# Patient Record
Sex: Female | Born: 1985 | Race: White | Hispanic: No | Marital: Married | Smoking: Heavy tobacco smoker
Health system: Southern US, Community
[De-identification: ages and names within clinical notes are randomized; demographics above are authoritative.]

## PROBLEM LIST (undated history)

## (undated) ENCOUNTER — Inpatient Hospital Stay: Payer: Self-pay

## (undated) ENCOUNTER — Inpatient Hospital Stay (HOSPITAL_COMMUNITY): Payer: Self-pay

## (undated) ENCOUNTER — Emergency Department (HOSPITAL_BASED_OUTPATIENT_CLINIC_OR_DEPARTMENT_OTHER): Admission: EM | Payer: Medicaid Other | Source: Home / Self Care

## (undated) DIAGNOSIS — B999 Unspecified infectious disease: Secondary | ICD-10-CM

## (undated) DIAGNOSIS — J45909 Unspecified asthma, uncomplicated: Secondary | ICD-10-CM

## (undated) DIAGNOSIS — F419 Anxiety disorder, unspecified: Secondary | ICD-10-CM

## (undated) DIAGNOSIS — F329 Major depressive disorder, single episode, unspecified: Secondary | ICD-10-CM

## (undated) DIAGNOSIS — F1910 Other psychoactive substance abuse, uncomplicated: Secondary | ICD-10-CM

## (undated) DIAGNOSIS — N809 Endometriosis, unspecified: Secondary | ICD-10-CM

## (undated) DIAGNOSIS — F32A Depression, unspecified: Secondary | ICD-10-CM

## (undated) HISTORY — PX: DIAGNOSTIC LAPAROSCOPY: SUR761

---
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
## 2007-01-01 ENCOUNTER — Emergency Department: Payer: Self-pay | Admitting: Emergency Medicine

## 2007-01-29 ENCOUNTER — Observation Stay: Payer: Self-pay | Admitting: Obstetrics and Gynecology

## 2007-03-12 ENCOUNTER — Observation Stay: Payer: Self-pay | Admitting: Obstetrics and Gynecology

## 2007-03-30 ENCOUNTER — Ambulatory Visit: Payer: Self-pay | Admitting: Urology

## 2007-04-07 ENCOUNTER — Observation Stay: Payer: Self-pay | Admitting: Obstetrics and Gynecology

## 2007-04-18 ENCOUNTER — Observation Stay: Payer: Self-pay | Admitting: Obstetrics and Gynecology

## 2007-04-25 ENCOUNTER — Observation Stay: Payer: Self-pay | Admitting: Obstetrics and Gynecology

## 2007-04-26 ENCOUNTER — Inpatient Hospital Stay: Payer: Self-pay | Admitting: Obstetrics and Gynecology

## 2007-04-26 ENCOUNTER — Observation Stay: Payer: Self-pay | Admitting: Obstetrics and Gynecology

## 2008-07-06 ENCOUNTER — Emergency Department: Payer: Self-pay | Admitting: Emergency Medicine

## 2008-07-08 ENCOUNTER — Emergency Department: Payer: Self-pay | Admitting: Emergency Medicine

## 2008-07-20 ENCOUNTER — Emergency Department: Payer: Self-pay | Admitting: Emergency Medicine

## 2009-02-14 ENCOUNTER — Emergency Department: Payer: Self-pay | Admitting: Emergency Medicine

## 2009-08-22 ENCOUNTER — Emergency Department: Payer: Self-pay | Admitting: Emergency Medicine

## 2010-01-03 ENCOUNTER — Emergency Department: Payer: Self-pay | Admitting: Emergency Medicine

## 2011-02-14 ENCOUNTER — Emergency Department: Payer: Self-pay | Admitting: Emergency Medicine

## 2011-05-10 ENCOUNTER — Emergency Department: Payer: Self-pay | Admitting: Emergency Medicine

## 2011-12-03 ENCOUNTER — Observation Stay: Payer: Self-pay

## 2012-02-07 ENCOUNTER — Observation Stay: Payer: Self-pay

## 2012-03-02 ENCOUNTER — Observation Stay: Payer: Self-pay | Admitting: Obstetrics and Gynecology

## 2012-03-10 ENCOUNTER — Observation Stay: Payer: Self-pay

## 2012-03-17 ENCOUNTER — Inpatient Hospital Stay: Payer: Self-pay | Admitting: Obstetrics and Gynecology

## 2012-03-17 LAB — CBC WITH DIFFERENTIAL/PLATELET
Eosinophil #: 0.1 10*3/uL (ref 0.0–0.7)
Eosinophil %: 0.7 %
HCT: 35.9 % (ref 35.0–47.0)
HGB: 11.7 g/dL — ABNORMAL LOW (ref 12.0–16.0)
Lymphocyte #: 1.7 10*3/uL (ref 1.0–3.6)
Lymphocyte %: 12 %
MCH: 30.9 pg (ref 26.0–34.0)
MCHC: 32.5 g/dL (ref 32.0–36.0)
MCV: 95 fL (ref 80–100)
Monocyte #: 0.6 x10 3/mm (ref 0.2–0.9)
Monocyte %: 4.4 %
Neutrophil #: 11.6 10*3/uL — ABNORMAL HIGH (ref 1.4–6.5)
Neutrophil %: 82.6 %
Platelet: 183 10*3/uL (ref 150–440)
RBC: 3.78 10*6/uL — ABNORMAL LOW (ref 3.80–5.20)
RDW: 13.7 % (ref 11.5–14.5)
WBC: 14 10*3/uL — ABNORMAL HIGH (ref 3.6–11.0)

## 2013-06-24 ENCOUNTER — Emergency Department: Payer: Self-pay | Admitting: Emergency Medicine

## 2013-09-22 ENCOUNTER — Emergency Department: Payer: Self-pay | Admitting: Emergency Medicine

## 2014-03-29 ENCOUNTER — Emergency Department: Payer: Self-pay | Admitting: Emergency Medicine

## 2014-06-24 ENCOUNTER — Emergency Department: Payer: Self-pay | Admitting: Emergency Medicine

## 2014-07-23 ENCOUNTER — Emergency Department: Payer: Self-pay | Admitting: Emergency Medicine

## 2014-08-01 ENCOUNTER — Emergency Department: Payer: Self-pay | Admitting: Emergency Medicine

## 2014-12-06 NOTE — L&D Delivery Note (Signed)
Delivery Note At 1:22 AM a vigorous female was delivered via Vaginal, Spontaneous Delivery (Presentation Direct Occiput Posterior).  APGAR: 8, 9; weight  .   Placenta status: Intact, Spontaneous.  Cord: 3 vessels.   Complications: persistent OP, variable decelerations second stage, Moderate MSAF  Anesthesia: Epidural  Episiotomy: None Lacerations: None Suture Repair: N/A Est. Blood Loss (mL): 350  Mom in LDR for recovery.  Baby to Couplet care / Skin to Skin. Logan/ Bottle  Sargun Rummell 09/16/2015, 1:55 AM

## 2015-01-28 ENCOUNTER — Emergency Department: Payer: Self-pay | Admitting: Student

## 2015-03-13 ENCOUNTER — Inpatient Hospital Stay (HOSPITAL_COMMUNITY)
Admission: AD | Admit: 2015-03-13 | Discharge: 2015-03-13 | Disposition: A | Discharge status: Home / Self Care | Payer: Medicaid Other | Source: Ambulatory Visit | Attending: Obstetrics & Gynecology | Admitting: Obstetrics & Gynecology

## 2015-03-13 ENCOUNTER — Encounter (HOSPITAL_COMMUNITY): Payer: Self-pay | Admitting: *Deleted

## 2015-03-13 ENCOUNTER — Inpatient Hospital Stay (HOSPITAL_COMMUNITY): Payer: Medicaid Other

## 2015-03-13 DIAGNOSIS — Z3A13 13 weeks gestation of pregnancy: Secondary | ICD-10-CM

## 2015-03-13 DIAGNOSIS — R1030 Lower abdominal pain, unspecified: Secondary | ICD-10-CM | POA: Insufficient documentation

## 2015-03-13 DIAGNOSIS — Z3A12 12 weeks gestation of pregnancy: Secondary | ICD-10-CM | POA: Diagnosis not present

## 2015-03-13 DIAGNOSIS — Z3687 Encounter for antenatal screening for uncertain dates: Secondary | ICD-10-CM

## 2015-03-13 DIAGNOSIS — Z87891 Personal history of nicotine dependence: Secondary | ICD-10-CM | POA: Diagnosis not present

## 2015-03-13 DIAGNOSIS — O26899 Other specified pregnancy related conditions, unspecified trimester: Secondary | ICD-10-CM

## 2015-03-13 DIAGNOSIS — R109 Unspecified abdominal pain: Secondary | ICD-10-CM

## 2015-03-13 DIAGNOSIS — O9989 Other specified diseases and conditions complicating pregnancy, childbirth and the puerperium: Secondary | ICD-10-CM | POA: Insufficient documentation

## 2015-03-13 HISTORY — DX: Endometriosis, unspecified: N80.9

## 2015-03-13 HISTORY — DX: Depression, unspecified: F32.A

## 2015-03-13 HISTORY — DX: Unspecified infectious disease: B99.9

## 2015-03-13 HISTORY — DX: Major depressive disorder, single episode, unspecified: F32.9

## 2015-03-13 HISTORY — DX: Unspecified asthma, uncomplicated: J45.909

## 2015-03-13 LAB — CBC WITH DIFFERENTIAL/PLATELET
BASOS ABS: 0 10*3/uL (ref 0.0–0.1)
Basophils Relative: 0 % (ref 0–1)
EOS PCT: 1 % (ref 0–5)
Eosinophils Absolute: 0.1 10*3/uL (ref 0.0–0.7)
HCT: 35.5 % — ABNORMAL LOW (ref 36.0–46.0)
Hemoglobin: 12.2 g/dL (ref 12.0–15.0)
LYMPHS PCT: 24 % (ref 12–46)
Lymphs Abs: 2.4 10*3/uL (ref 0.7–4.0)
MCH: 31.9 pg (ref 26.0–34.0)
MCHC: 34.4 g/dL (ref 30.0–36.0)
MCV: 92.9 fL (ref 78.0–100.0)
Monocytes Absolute: 0.4 10*3/uL (ref 0.1–1.0)
Monocytes Relative: 4 % (ref 3–12)
NEUTROS ABS: 7.4 10*3/uL (ref 1.7–7.7)
Neutrophils Relative %: 71 % (ref 43–77)
PLATELETS: 202 10*3/uL (ref 150–400)
RBC: 3.82 MIL/uL — ABNORMAL LOW (ref 3.87–5.11)
RDW: 12.8 % (ref 11.5–15.5)
WBC: 10.4 10*3/uL (ref 4.0–10.5)

## 2015-03-13 LAB — URINALYSIS, ROUTINE W REFLEX MICROSCOPIC
BILIRUBIN URINE: NEGATIVE
GLUCOSE, UA: NEGATIVE mg/dL
Ketones, ur: NEGATIVE mg/dL
Leukocytes, UA: NEGATIVE
Nitrite: NEGATIVE
PH: 6 (ref 5.0–8.0)
Protein, ur: NEGATIVE mg/dL
SPECIFIC GRAVITY, URINE: 1.01 (ref 1.005–1.030)
Urobilinogen, UA: 0.2 mg/dL (ref 0.0–1.0)

## 2015-03-13 LAB — WET PREP, GENITAL
Clue Cells Wet Prep HPF POC: NONE SEEN
TRICH WET PREP: NONE SEEN
YEAST WET PREP: NONE SEEN

## 2015-03-13 LAB — URINE MICROSCOPIC-ADD ON

## 2015-03-13 LAB — ABO/RH: ABO/RH(D): O POS

## 2015-03-13 NOTE — MAU Provider Note (Signed)
CSN: [id number]     Arrival date & time 03/13/15  1759 History   None    Chief Complaint  Patient presents with  . Possible Pregnancy  . Abdominal Pain     (Consider location/radiation/quality/duration/timing/severity/associated sxs/prior Treatment) Patient is a 29 y.o. female presenting with abdominal pain. The history is provided by the patient.  Abdominal Pain The primary symptoms of the illness include abdominal pain and vaginal discharge. The primary symptoms of the illness do not include vaginal bleeding. The current episode started 2 days ago. The onset of the illness was gradual.  The patient states that she believes she is currently pregnant. The patient has not had a change in bowel habit. Additional symptoms associated with the illness include frequency.   Betty Gomez is a 29 y.o. [phone] @ 2964w3d gestation by ? LMP. She presents to the MAU with lower abdominal cramping that has been off and on for the past couple days. She reports that she has tried to get in with the health department to start her prenatal care but needs a pregnancy verification letter.   Past Medical History  Diagnosis Date  . Asthma   . Infection     UTI  . Depression     ok, came off meds, has someone she sees for this.  . Endometriosis    Past Surgical History  Procedure Laterality Date  . Diagnostic laparoscopy      endometriosis   Family History  Problem Relation Age of Onset  . Hypertension Mother   . Diabetes Mother   . Heart disease Mother [phone]    failure  . Heart disease Maternal Grandfather   . Diabetes Paternal Grandmother   . Hypertension Paternal Grandmother   . Cancer Paternal Grandfather     lung  . Hearing loss Neg Hx    History  Substance Use Topics  . Smoking status: Former Smoker -- 0.25 packs/day for 10 years    Types: Cigarettes  . Smokeless tobacco: Never Used  . Alcohol Use: No   OB History    Gravida Para Term Preterm AB TAB SAB Ectopic Multiple Living   3 2 2   0 0 0 0 0 0 4     Review of Systems  Gastrointestinal: Positive for abdominal pain.  Genitourinary: Positive for frequency and vaginal discharge. Negative for vaginal bleeding.  all other systems negative    Allergies  Review of patient's allergies indicates no known allergies.  Home Medications   Prior to Admission medications   Medication Sig Start Date End Date Taking? Authorizing Provider  acetaminophen (TYLENOL) 500 MG tablet Take 1,000 mg by mouth every 6 (six) hours as needed for headache.   Yes Historical Provider, MD  Prenatal Vit-Fe Fumarate-FA (PRENATAL MULTIVITAMIN) TABS tablet Take 1 tablet by mouth daily at 12 noon.   Yes Historical Provider, MD   BP 127/60 mmHg  Pulse 72  Temp(Src) 97.8 F (36.6 C) (Oral)  Resp 18  LMP 12/16/2014 (Approximate) Physical Exam  Constitutional: She is oriented to person, place, and time. She appears well-developed and well-nourished. No distress.  HENT:  Head: Normocephalic.  Eyes: EOM are normal.  Neck: Neck supple.  Cardiovascular: Normal rate.   Pulmonary/Chest: Effort normal.  Abdominal: Soft. There is no tenderness.  Unable to reproduce the cramping pain.  Genitourinary:  External genitalia without lesions, white d/c vaginal vault. Cervix closed, thick without CMT, no adnexal tenderness, uterus approximately 14 week size.   Musculoskeletal: Normal range of motion.  Neurological: She is alert and oriented to person, place, and time. No cranial nerve deficit.  Skin: Skin is warm and dry.  Psychiatric: She has a normal mood and affect. Her behavior is normal.  Nursing note and vitals reviewed.   ED Course  Procedures (including critical care time) Results for orders placed or performed during the hospital encounter of 03/13/15 (from the past 24 hour(s))  Urinalysis, Routine w reflex microscopic     Status: Abnormal   Collection Time: 03/13/15  6:40 PM  Result Value Ref Range   Color, Urine YELLOW YELLOW   APPearance  CLEAR CLEAR   Specific Gravity, Urine 1.010 1.005 - 1.030   pH 6.0 5.0 - 8.0   Glucose, UA NEGATIVE NEGATIVE mg/dL   Hgb urine dipstick MODERATE (A) NEGATIVE   Bilirubin Urine NEGATIVE NEGATIVE   Ketones, ur NEGATIVE NEGATIVE mg/dL   Protein, ur NEGATIVE NEGATIVE mg/dL   Urobilinogen, UA 0.2 0.0 - 1.0 mg/dL   Nitrite NEGATIVE NEGATIVE   Leukocytes, UA NEGATIVE NEGATIVE  Urine microscopic-add on     Status: None   Collection Time: 03/13/15  6:40 PM  Result Value Ref Range   Squamous Epithelial / LPF RARE RARE   WBC, UA 0-2 <3 WBC/hpf   RBC / HPF 3-6 <3 RBC/hpf   Bacteria, UA RARE RARE  CBC with Differential/Platelet     Status: Abnormal   Collection Time: 03/13/15  6:52 PM  Result Value Ref Range   WBC 10.4 4.0 - 10.5 K/uL   RBC 3.82 (L) 3.87 - 5.11 MIL/uL   Hemoglobin 12.2 12.0 - 15.0 g/dL   HCT 04.5 (L) 40.9 - 81.1 %   MCV 92.9 78.0 - 100.0 fL   MCH 31.9 26.0 - 34.0 pg   MCHC 34.4 30.0 - 36.0 g/dL   RDW 91.4 78.2 - 95.6 %   Platelets 202 150 - 400 K/uL   Neutrophils Relative % 71 43 - 77 %   Neutro Abs 7.4 1.7 - 7.7 K/uL   Lymphocytes Relative 24 12 - 46 %   Lymphs Abs 2.4 0.7 - 4.0 K/uL   Monocytes Relative 4 3 - 12 %   Monocytes Absolute 0.4 0.1 - 1.0 K/uL   Eosinophils Relative 1 0 - 5 %   Eosinophils Absolute 0.1 0.0 - 0.7 K/uL   Basophils Relative 0 0 - 1 %   Basophils Absolute 0.0 0.0 - 0.1 K/uL  ABO/Rh     Status: None   Collection Time: 03/13/15  6:52 PM  Result Value Ref Range   ABO/RH(D) O POS   Wet prep, genital     Status: Abnormal   Collection Time: 03/13/15  7:04 PM  Result Value Ref Range   Yeast Wet Prep HPF POC NONE SEEN NONE SEEN   Trich, Wet Prep NONE SEEN NONE SEEN   Clue Cells Wet Prep HPF POC NONE SEEN NONE SEEN   WBC, Wet Prep HPF POC FEW (A) NONE SEEN    Imaging Review US Ob Comp Less 14 Wks  03/13/2015   CLINICAL DATA:  Unsure of LMP.  Lower abdominal pain for 1 week.  EXAM: OBSTETRIC <14 WK ULTRASOUND  TECHNIQUE: Transabdominal  ultrasound was performed for evaluation of the gestation as well as the maternal uterus and adnexal regions.  COMPARISON:  None.  FINDINGS: Intrauterine gestational sac: Visualized/normal in shape.  Yolk sac:  Not visualized  Embryo:  Visualized  Cardiac Activity: Visualized  Heart Rate: 163 bpm  CRL:   69  mm   13 w 1 d                  [place] EDC: 09/17/2015  Maternal uterus/adnexae: Both ovaries are normal in appearance. No mass or free fluid identified.  IMPRESSION: Single living IUP measuring 13 weeks 1 day with [place] EDC of 09/17/2015.  No significant maternal uterine or adnexal abnormality identified.   Electronically Signed   By: Myles Rosenthal M.D.   On: 03/13/2015 20:10     MDM  29 y.o. G3P2004 @ 13.[redacted] weeks gestation with lower abdominal cramping stable for d/c without acute abdomen. Viable IUP.  She lives in [place] and will call the [facility] office to schedule appointment to start prenatal care. She will return here as needed for problems.   Final diagnoses:  Abdominal cramping affecting pregnancy

## 2015-03-13 NOTE — MAU Note (Addendum)
Pt reports she had a positive HPT in Feb. Has tried to get appointment with health dept need proof of pregnancy. C/o having sharp abd pains  now off and on. Positive Fetal HR in Triage 154

## 2015-03-13 NOTE — Discharge Instructions (Signed)
[facility] [place] for follow up  Address [address] [phone].

## 2015-03-13 NOTE — MAU Note (Addendum)
Unsure of date of last depo, August or Oct. No other form of BC used.  ? LMP in Dec.  Had 2 + HPT 02/23. Few spots of blood in Feb.  Boobs have been really sore.  Pain in lower abd for a couple days, usually with movement.

## 2015-03-14 LAB — GC/CHLAMYDIA PROBE AMP (~~LOC~~) NOT AT ARMC
Chlamydia: NEGATIVE
NEISSERIA GONORRHEA: NEGATIVE

## 2015-03-14 LAB — HIV ANTIBODY (ROUTINE TESTING W REFLEX): HIV Screen 4th Generation wRfx: NONREACTIVE

## 2015-03-14 LAB — RPR: RPR Ser Ql: NONREACTIVE

## 2015-04-15 NOTE — H&P (Signed)
L&D Evaluation:  History:   HPI 29 year old G2P1 presents to L&D with c/o dizziness at 35 2/7. Dizzy spells have been going on for about 3-4 days and pt got worried and decided to come in.  [facility] care and elevated 1 hour GCT in which pt has never gotten 3 hour GTT (appt got cancelled by office, then pt no showed to resched appt, and has appt tomorrow to get it done). No other problems/concerns during pregnancy so far. Pt states dizziness happens more if she has been standing for awhile, and she will suddenly get hot/flushed and dizzy. She denies VB, LOF, notes good FM    Presents with other, dizzy    Patient's Medical History Asthma  endometriosis    Patient's Surgical History other  exp lap    Medications Pre Natal Vitamins    Allergies NKDA    Social History none    Family History Non-Contributory   ROS:   ROS see HPI   Exam:   Vital Signs stable    Urine Protein not completed    General no apparent distress    Mental Status clear    Chest clear    Abdomen gravid, non-tender    Estimated Fetal Weight Average for gestational age    Back no CVAT    Mebranes Intact    FHT normal rate with no decels    Ucx absent    Skin dry   Impression:   Impression reactive NST, other, vasovagal response vs. low blood sugar   Plan:   Plan EFM/NST, discharge    Comments Discussed with pt possibilities of dizzy spells. Could be vasovagal response or low blood sugar. Not likely to be BP related considering pt BP normal here and dizzy spells come after standing for a while (not orthostatic). Could also be low blood sugar from not eating for several hours on some occasions while at work. Pt to get 3 hour GTT tomorrow and have ROB appt.    Follow Up Appointment already scheduled   Electronic Signatures: Shella Maximutnam, Dajion Bickford (CNM)  (Signed 04-Mar-13 22:48)  Authored: L&D Evaluation   Last Updated: 04-Mar-13 22:48 by Shella MaximPutnam, Deazia Lampi (CNM)

## 2015-04-15 NOTE — H&P (Signed)
L&D Evaluation:  History:   HPI 29 yo G2P1001 at 5014w0d presenting with contractions, no LOF, no VB, +FM.    Presents with contractions    Patient's Medical History Asthma  endometriosis    Patient's Surgical History diagnostic laparoscopy    Medications Pre Natal Vitamins    Allergies NKDA    Social History none   ROS:   ROS All systems were reviewed.  HEENT, CNS, GI, GU, Respiratory, CV, Renal and Musculoskeletal systems were found to be normal.   Exam:   Vital Signs stable    General no apparent distress    Mental Status clear    Chest clear    Heart normal sinus rhythm    Abdomen gravid, tender with contractions    Estimated Fetal Weight Average for gestational age    Fetal Position vtx    Edema no edema    Pelvic change from 3 to 5 cm    Mebranes Intact    FHT normal rate with no decels    Ucx regular   Impression:   Impression active labor   Plan:   Plan monitor contractions and for cervical change, monitor BP    Comments - admit for term labor   Electronic Signatures: Lorrene ReidStaebler, Angelie Kram M (MD)  (Signed 12-Apr-13 09:33)  Authored: L&D Evaluation   Last Updated: 12-Apr-13 09:33 by Lorrene ReidStaebler, Shadow Schedler M (MD)

## 2015-05-12 LAB — OB RESULTS CONSOLE VARICELLA ZOSTER ANTIBODY, IGG: Varicella: IMMUNE

## 2015-05-12 LAB — OB RESULTS CONSOLE ANTIBODY SCREEN: ANTIBODY SCREEN: NEGATIVE

## 2015-05-12 LAB — OB RESULTS CONSOLE RUBELLA ANTIBODY, IGM: RUBELLA: UNDETERMINED

## 2015-05-12 LAB — OB RESULTS CONSOLE HIV ANTIBODY (ROUTINE TESTING): HIV: NONREACTIVE

## 2015-05-12 LAB — OB RESULTS CONSOLE HEPATITIS B SURFACE ANTIGEN: HEP B S AG: NEGATIVE

## 2015-05-12 LAB — OB RESULTS CONSOLE RPR: RPR: NONREACTIVE

## 2015-05-14 LAB — OB RESULTS CONSOLE RPR: RPR: NONREACTIVE

## 2015-05-14 LAB — OB RESULTS CONSOLE HIV ANTIBODY (ROUTINE TESTING): HIV: NONREACTIVE

## 2015-05-24 ENCOUNTER — Inpatient Hospital Stay
Admission: EM | Admit: 2015-05-24 | Discharge: 2015-05-24 | Disposition: A | Discharge status: Home / Self Care | Payer: Medicaid Other | Attending: Obstetrics & Gynecology | Admitting: Obstetrics & Gynecology

## 2015-05-24 ENCOUNTER — Encounter: Payer: Self-pay | Admitting: *Deleted

## 2015-05-24 DIAGNOSIS — O280 Abnormal hematological finding on antenatal screening of mother: Secondary | ICD-10-CM | POA: Diagnosis present

## 2015-05-24 DIAGNOSIS — O479 False labor, unspecified: Secondary | ICD-10-CM | POA: Diagnosis present

## 2015-05-24 DIAGNOSIS — Z3A23 23 weeks gestation of pregnancy: Secondary | ICD-10-CM | POA: Diagnosis not present

## 2015-05-24 DIAGNOSIS — R109 Unspecified abdominal pain: Secondary | ICD-10-CM | POA: Insufficient documentation

## 2015-05-24 DIAGNOSIS — O26892 Other specified pregnancy related conditions, second trimester: Secondary | ICD-10-CM | POA: Diagnosis not present

## 2015-05-24 HISTORY — DX: Anxiety disorder, unspecified: F41.9

## 2015-05-24 NOTE — H&P (Signed)
Obstetrics Admission History & Physical   Abdominal Pain   HPI:  29 y.o. P1W2585 @ [redacted]w[redacted]d (09/16/2015, by Other Basis). Admitted on 05/24/2015:   Patient Active Problem List   Diagnosis Date Noted  . Abnormal antenatal AFP screen 05/24/2015  . Irregular contractions 05/24/2015     Presents for pain in lower quadrants.  Stress of recent dx of elevated AFP, concern for Trisomy18. Prenatal care at: at [facility]plicated by late onset to [facility] (21 weeks).  Preg dated by 7 week US done at an ER visit.  PMHx:  Past Medical History  Diagnosis Date  . Asthma   . Infection     UTI  . Depression     ok, came off meds, has someone she sees for this.  . Endometriosis   . Anxiety    PSHx:  Past Surgical History  Procedure Laterality Date  . Diagnostic laparoscopy      endometriosis   Medications:  Prescriptions prior to admission  Medication Sig Dispense Refill Last Dose  . Prenatal Vit-Fe Fumarate-FA (PRENATAL MULTIVITAMIN) TABS tablet Take 1 tablet by mouth daily at 12 noon.   05/24/2015 at Unknown time  . acetaminophen (TYLENOL) 500 MG tablet Take 1,000 mg by mouth every 6 (six) hours as needed for headache.   Unknown at Unknown time   Allergies: has No Known Allergies. OBHx:  OB History  Gravida Para Term Preterm AB SAB TAB Ectopic Multiple Living  3 2 2  0 0 0 0 0 0 2    # Outcome Date GA Lbr Len/2nd Weight Sex Delivery Anes PTL Lv  3 Current           2 Term     M  EPI N Y  1 Term     M   N Y     IDP:[id number] except as detailed in HPI. Soc Hx: Current smoker, MJ use  Objective:   Filed Vitals:   05/24/15 1143  BP: 109/69  Pulse: 76  Temp: 97.8 F (36.6 C)  Resp: 20   General: Well nourished, well developed female in no acute distress.  Skin: Warm and dry.  Cardiovascular:Regular rate and rhythm. Respiratory: Clear to auscultation bilateral. Normal respiratory effort Abdomen: gravid, ND, NT Neuro/Psych: Normal mood and affect.   EFM:120s Toco:  none  Assessment & Plan:   29 y.o. Y1P5093 @ [redacted]w[redacted]d, Admitted on 05/24/2015:    1. Round ligament Pain 2. Stress regarding diagosis of elevated AFP and its pregnancy implications.  Concern for Trisomy 18.  Already has labs pending and DP arranged.     Discharge Home

## 2015-05-24 NOTE — Discharge Summary (Addendum)
Physician Discharge Summary  Patient ID: TRIONA SEVERIN MRN: [id number] DOB/AGE: 01-Mar-1986 29 y.o.  Admit date: 05/24/2015 Discharge date: 05/24/2015  Admission Diagnoses:  Discharge Diagnoses:  Active Problems:   Abnormal antenatal AFP screen   Round Ligament Abdominal Pain of pregnancy   Discharged Condition: good  Hospital Course: Fetal Monitoring reassuring Discussed risks of Trisomy 61.  Consults: None  Significant Diagnostic Studies: none  Treatments: none  Discharge Exam: Blood pressure 109/69, pulse 76, temperature 97.8 F (36.6 C), temperature source Oral, resp. rate 20, last menstrual period 12/16/2014. General appearance: alert and cooperative GI: soft, non-tender; bowel sounds normal; no masses,  no organomegaly and gravid, FHTs 120s No contractions  Disposition: 01-Home or Self Care     Medication List    TAKE these medications        acetaminophen 500 MG tablet  Commonly known as:  TYLENOL  Take 1,000 mg by mouth every 6 (six) hours as needed for headache.     prenatal multivitamin Tabs tablet  Take 1 tablet by mouth daily at 12 noon.         Signed: Letitia Libra 05/24/2015, 1:00 PM

## 2015-05-24 NOTE — Discharge Instructions (Signed)

## 2015-05-26 ENCOUNTER — Other Ambulatory Visit: Payer: Self-pay | Admitting: Obstetrics and Gynecology

## 2015-05-26 DIAGNOSIS — Q913 Trisomy 18, unspecified: Secondary | ICD-10-CM

## 2015-08-16 ENCOUNTER — Observation Stay
Admission: EM | Admit: 2015-08-16 | Discharge: 2015-08-16 | Disposition: A | Discharge status: Home / Self Care | Payer: Medicaid Other | Attending: Obstetrics and Gynecology | Admitting: Obstetrics and Gynecology

## 2015-08-16 DIAGNOSIS — F419 Anxiety disorder, unspecified: Secondary | ICD-10-CM | POA: Insufficient documentation

## 2015-08-16 DIAGNOSIS — Z3A35 35 weeks gestation of pregnancy: Secondary | ICD-10-CM | POA: Diagnosis not present

## 2015-08-16 DIAGNOSIS — J45909 Unspecified asthma, uncomplicated: Secondary | ICD-10-CM | POA: Insufficient documentation

## 2015-08-16 DIAGNOSIS — O26893 Other specified pregnancy related conditions, third trimester: Secondary | ICD-10-CM | POA: Diagnosis present

## 2015-08-16 DIAGNOSIS — O98813 Other maternal infectious and parasitic diseases complicating pregnancy, third trimester: Principal | ICD-10-CM | POA: Insufficient documentation

## 2015-08-16 DIAGNOSIS — O99333 Smoking (tobacco) complicating pregnancy, third trimester: Secondary | ICD-10-CM | POA: Insufficient documentation

## 2015-08-16 DIAGNOSIS — L292 Pruritus vulvae: Secondary | ICD-10-CM | POA: Diagnosis present

## 2015-08-16 DIAGNOSIS — B379 Candidiasis, unspecified: Secondary | ICD-10-CM | POA: Diagnosis present

## 2015-08-16 MED ORDER — TERCONAZOLE 0.4 % VA CREA: 1.0000 | TOPICAL_CREAM | Freq: Every day | VAGINAL | Status: DC

## 2015-08-16 NOTE — H&P (Signed)
Obstetric H&P   Chief Complaint: Yeast  Prenatal Care Provider: [facility]  History of Present Illness: 29 y.o. [id number] [redacted]w[redacted]d by 09/16/2015, presenting with vaginal burning and itching.  White discharge but main complain is burning anteriorly.  No dysuria.  Was previously treated with diflucan for vaginal candida with no improvement in symptoms.  She has self treated with vagisil.   ABO, Rh: --/--/O POS (04/07 1852)  Antibody: Negative (06/06 0000)  RPR: Nonreactive (06/06 0000)  HBsAg: Negative (06/06 0000)  HIV: Non-reactive (06/06 0000)  RPR: Nonreactive (06/06 0000)  Review of Systems: 10 point review of systems negative unless otherwise noted in HPI  Past Medical History: Past Medical History  Diagnosis Date  . Asthma   . Infection     UTI  . Depression     ok, came off meds, has someone she sees for this.  . Endometriosis   . Anxiety     Past Surgical History: Past Surgical History  Procedure Laterality Date  . Diagnostic laparoscopy      endometriosis    Past Obstetric History:  Past Gynecologic History:  Family History: Family History  Problem Relation Age of Onset  . Hypertension Mother   . Diabetes Mother   . Heart disease Mother 42    failure  . Heart disease Maternal Grandfather   . Diabetes Paternal Grandmother   . Hypertension Paternal Grandmother   . Cancer Paternal Grandfather     lung  . Hearing loss Neg Hx     Social History: Social History   Social History  . Marital Status: Single    Spouse Name: N/A  . Number of Children: N/A  . Years of Education: N/A   Occupational History  . Not on file.   Social History Main Topics  . Smoking status: Light Tobacco Smoker -- 0.25 packs/day for 10 years    Types: Cigarettes  . Smokeless tobacco: Never Used  . Alcohol Use: No  . Drug Use: Yes    Special: Marijuana  . Sexual Activity: Yes   Other Topics Concern  . Not on file   Social History Narrative    Medications: Prior to Admission  medications   Medication Sig Start Date End Date Taking? Authorizing Provider  acetaminophen (TYLENOL) 500 MG tablet Take 1,000 mg by mouth every 6 (six) hours as needed for headache.    Historical Provider, MD  Prenatal Vit-Fe Fumarate-FA (PRENATAL MULTIVITAMIN) TABS tablet Take 1 tablet by mouth daily at 12 noon.    Historical Provider, MD  terconazole (TERAZOL 7) 0.4 % vaginal cream Place 1 applicator vaginally at bedtime. 08/16/15   Vena Austria, MD    Allergies: No Known Allergies  Physical Exam: Vitals: Blood pressure 115/66, pulse 92, temperature 98.2 F (36.8 C), temperature source Oral, resp. rate 16, height  (1.676 m), weight 86.183 kg (190 lb), last menstrual period 12/16/2014.  Urine Dip Protein: N/A  FHT: 130, moderate, positive accels, no decels Toco: absent  General: NAD HEENT: normocephalic, anicteric Pulmonary: no increased work of breathing Abdomen: Gravid,  Non-tender Genitourinary: some external erythema, thick white discharge.  Wet mount +hyphea, no clue cells, no trichomonas Extremities: no edema  Labs: No results found for this or any previous visit (from the past 24 hour(s)).  Assessment: 29 y.o. A5W0981 [redacted]w[redacted]d by 09/16/2015, presenting with vaginal candida  Plan: 1) Vaginal candida - stop vagisil, discussed allergic reactions to benzocaine are commone so may contribute.  Rx terzole 7  2) Fetus - cat I  tracing  3) Disposition - home

## 2015-08-16 NOTE — OB Triage Note (Addendum)
29 y.o. female presents today with complaint of burning in her vagina .  States that this started a few weeks ago when she was treated for a yeast infection, but that it has gotten worse and she feels it is not working .  She initially self treated two weeks ago and then was treated a week ago at office with medication. States that this is a 9-10/10 on the pain scale. She states that nothing makes it better and moving makes it worse. At home has tried her prescription to make it better.  Labia is edematous with erythema and she has a rash noted on her right inner thigh area.

## 2015-09-08 ENCOUNTER — Encounter: Payer: Self-pay | Admitting: *Deleted

## 2015-09-08 ENCOUNTER — Observation Stay
Admission: EM | Admit: 2015-09-08 | Discharge: 2015-09-08 | Disposition: A | Discharge status: Home / Self Care | Payer: Medicaid Other | Attending: Obstetrics and Gynecology | Admitting: Obstetrics and Gynecology

## 2015-09-08 DIAGNOSIS — F419 Anxiety disorder, unspecified: Secondary | ICD-10-CM | POA: Insufficient documentation

## 2015-09-08 DIAGNOSIS — J45909 Unspecified asthma, uncomplicated: Secondary | ICD-10-CM | POA: Insufficient documentation

## 2015-09-08 DIAGNOSIS — O280 Abnormal hematological finding on antenatal screening of mother: Secondary | ICD-10-CM

## 2015-09-08 DIAGNOSIS — O4292 Full-term premature rupture of membranes, unspecified as to length of time between rupture and onset of labor: Secondary | ICD-10-CM | POA: Diagnosis not present

## 2015-09-08 DIAGNOSIS — Z3A38 38 weeks gestation of pregnancy: Secondary | ICD-10-CM | POA: Insufficient documentation

## 2015-09-08 DIAGNOSIS — O99333 Smoking (tobacco) complicating pregnancy, third trimester: Secondary | ICD-10-CM | POA: Diagnosis not present

## 2015-09-08 DIAGNOSIS — O479 False labor, unspecified: Secondary | ICD-10-CM | POA: Diagnosis present

## 2015-09-08 LAB — URINE DRUG SCREEN, QUALITATIVE (ARMC ONLY)
Amphetamines, Ur Screen: NOT DETECTED
Barbiturates, Ur Screen: NOT DETECTED
Benzodiazepine, Ur Scrn: NOT DETECTED
Cannabinoid 50 Ng, Ur ~~LOC~~: POSITIVE — AB
Cocaine Metabolite,Ur ~~LOC~~: NOT DETECTED
MDMA (Ecstasy)Ur Screen: NOT DETECTED
Methadone Scn, Ur: NOT DETECTED
Opiate, Ur Screen: NOT DETECTED
Phencyclidine (PCP) Ur S: NOT DETECTED
TRICYCLIC, UR SCREEN: NOT DETECTED

## 2015-09-08 LAB — GLUCOSE, CAPILLARY: GLUCOSE-CAPILLARY: 90 mg/dL (ref 65–99)

## 2015-09-08 NOTE — Discharge Summary (Signed)
Obstetric History and Physical  Betty Gomez is a 29 y.o. [phone] with Estimated Date of Delivery: 09/16/15 per 7 week [place] who presents at [redacted]w[redacted]d  presenting for c/o leaking fluid and contractions. Patient states she has been having q 7 min contractions, no vaginal bleeding, a small trickle of fluid this am and  active fetal movement.    Prenatal Course Source of Care: [facility]  with onset of care at 21 weeks, no care since 34 weeks.   Pregnancy complications or risks: 1. AFP Tetra: increased T 18 risk, negative Informaseq and normal [place] at MFM 2. Late to care and gaps in care (only 5 prenatal visits) 3. Marijuana and cigarette use  Patient Active Problem List   Diagnosis Date Noted  . Braxton Hicks contractions 09/08/2015   She plans to bottle feed She desires bilateral tubal ligation, Nexplanon for postpartum contraception.   Prenatal labs and studies: ABO, Rh: O+  Antibody: negative Rubella: Non-immune Varicella: Immune RPR:  Non-reactive HBsAg:  Negative HIV: Negative GC/CT: neg/neg GBS: unknown - drawn today 1 hr Glucola: 1 hr: 154 (done at 34 weeks, has not had 3 hr scheduled)   Genetic screening: negative Informaseq    Prenatal Transfer Tool   Past Medical History  Diagnosis Date  . Asthma   . Depression     ok, came off meds, has someone she sees for this.  . Endometriosis   . Anxiety     Past Surgical History  Procedure Laterality Date  . Diagnostic laparoscopy      endometriosis    OB History  Gravida Para Term Preterm AB SAB TAB Ectopic Multiple Living  0 0 0 0 0 0 2    # Outcome Date GA Lbr Len/2nd Weight Sex Delivery Anes PTL Lv  3 Current           2 Term     M  EPI N Y  1 Term     M   N Y      Social History   Social History  . Marital Status: Single    Spouse Name: N/A  . Number of Children: N/A  . Years of Education: N/A   Social History Main Topics  . Smoking status: Light Tobacco Smoker -- 0.25 packs/day for 10 years   Types: Cigarettes  . Smokeless tobacco: Never Used  . Alcohol Use: No  . Drug Use: Yes    Special: Marijuana - reports has d/c  . Sexual Activity: Yes   Other Topics Concern  . None   Social History Narrative    Family History  Problem Relation Age of Onset  . Hypertension Mother   . Diabetes Mother   . Heart disease Mother 11    failure  . Heart disease Maternal Grandfather   . Diabetes Paternal Grandmother   . Hypertension Paternal Grandmother   . Cancer Paternal Grandfather     lung  . Hearing loss Neg Hx     Prescriptions prior to admission  Medication Sig Dispense Refill Last Dose  . Prenatal Vit-Fe Fumarate-FA (PRENATAL MULTIVITAMIN) TABS tablet Take 1 tablet by mouth daily at 12 noon.   09/07/2015 at Unknown time    No Known Allergies  Review of Systems: Negative except for what is mentioned in HPI.  Physical Exam: BP 119/74 mmHg  Pulse 99  Temp(Src) 98.4 F (36.9 C) (Oral)  Ht  (1.676 m)  LMP 12/16/2014 (Approximate) GENERAL: Well-developed, well-nourished female in no  acute distress.  ABDOMEN: Soft, nontender, nondistended, gravid. EXTREMITIES: Nontender, no edema Cervical Exam: Dilatation 4 cm   Effacement 50 %   Station -3, no significant change after ambulating x 1 hour Presentation: cephalic FHT: Category: 1 Baseline rate 140-150 bpm   Variability moderate  Accelerations present   Decelerations none Contractions: irregular   Pertinent Labs/Studies:   Random glucose: 90  Assessment : IUP at [redacted]w[redacted]d, early vs false labor  Plan: Discharge Home -labor precautions.  Work note given to start leave tomorrow Pt encouraged to make OB appt soon.  GBS swab obtained as pt has missed care since 34 weeks UDS obtained also for h/o marijuana use

## 2015-09-10 ENCOUNTER — Observation Stay
Admission: EM | Admit: 2015-09-10 | Discharge: 2015-09-10 | Disposition: A | Discharge status: Home / Self Care | Payer: Medicaid Other | Attending: Obstetrics and Gynecology | Admitting: Obstetrics and Gynecology

## 2015-09-10 DIAGNOSIS — O24419 Gestational diabetes mellitus in pregnancy, unspecified control: Secondary | ICD-10-CM | POA: Diagnosis not present

## 2015-09-10 DIAGNOSIS — Z3A39 39 weeks gestation of pregnancy: Secondary | ICD-10-CM | POA: Insufficient documentation

## 2015-09-10 LAB — CULTURE, BETA STREP (GROUP B ONLY)

## 2015-09-10 LAB — GLUCOSE, CAPILLARY
GLUCOSE-CAPILLARY: 140 mg/dL — AB (ref 65–99)
Glucose-Capillary: 114 mg/dL — ABNORMAL HIGH (ref 65–99)

## 2015-09-10 NOTE — OB Triage Note (Signed)
Pt c/o ctx q 4 min since 3 AM this morning with increasing intensity and unable to sleep.  Pt reports that baby was very active up until the ctx regularly started this AM.  Denies LOF.

## 2015-09-10 NOTE — Discharge Summary (Signed)
Betty Gomez is a 29 y.o. female. She is at [redacted]w[redacted]d gestation.  Indication: rule out labor  S: resting comfortable, feeling contractions about every 4-6 minutes, but not breathing heavily through them. +FM no LOF VB, O:  BP 126/79 mmHg  Pulse 72  Temp(Src) 97.8 F (36.6 C) (Oral)  Resp 16  Results for orders placed or performed during the hospital encounter of 09/10/15 (from the past 48 hour(s))  Glucose, capillary   Collection Time: 09/10/15  7:52 AM  Result Value Ref Range   Glucose-Capillary 114 (H) 65 - 99 mg/dL  Results for orders placed or performed during the hospital encounter of 09/08/15 (from the past 48 hour(s))  Culture, beta strep (group b only)   Collection Time: 09/08/15 11:56 AM  Result Value Ref Range   Specimen Description VAGINAL/RECTAL    Special Requests NONE    Culture      GROUP B STREP(S.AGALACTIAE)ISOLATED CRITICAL RESULT CALLED TO, READ BACK BY AND VERIFIED WITH: PAM BURWITZ  AT 0840 09/10/15 CTJ Virtually 100% of S. agalactiae (Group B) strains are susceptible to Penicillin.  For Penicillin-allergic patients, Erythromycin (85-95% sensitive) and Clindamycin (80% sensitive) are drugs of choice. Contact microbiology lab to request sensitivities if  needed within 7 days.    Report Status 09/10/2015 FINAL   Urine Drug Screen, Qualitative ([facility] only)   Collection Time: 09/08/15 11:56 AM  Result Value Ref Range   Tricyclic, Ur Screen NONE DETECTED NONE DETECTED   Amphetamines, Ur Screen NONE DETECTED NONE DETECTED   MDMA (Ecstasy)Ur Screen NONE DETECTED NONE DETECTED   Cocaine Metabolite,Ur [place] NONE DETECTED NONE DETECTED   Opiate, Ur Screen NONE DETECTED NONE DETECTED   Phencyclidine (PCP) Ur S NONE DETECTED NONE DETECTED   Cannabinoid 50 Ng, Ur [place] POSITIVE (A) NONE DETECTED   Barbiturates, Ur Screen NONE DETECTED NONE DETECTED   Benzodiazepine, Ur Scrn NONE DETECTED NONE DETECTED   Methadone Scn, Ur NONE DETECTED NONE DETECTED  Glucose, capillary   Collection Time: 09/08/15 12:14 PM  Result Value Ref Range   Glucose-Capillary 90 65 - 99 mg/dL     Gen: NAD, AAOx3      Abd: FNTTP      Ext: Non-tender, Nonedmeatous    FHT: 145mod + accels no decels TOCO: q4-6 mins SVE: 4/50/-3   A/P:  29 yo G3P2002 @ 39.1 with r/o labor and impaired glucose tolerance  Labor: not present.   GDM: fasting this AM was 114, and she drank a cola drink and then her BG was 140.  Given this is very late in her gestation, ideal glucose control may not be possible. She is close to being in spontaneous labor and will likely return in the next day or so.    Fetal Wellbeing: Reassuring Cat 1 tracing.  D/c home stable, precautions reviewed, follow-up as scheduled.   Betty Gomez, [name]

## 2015-09-15 ENCOUNTER — Inpatient Hospital Stay: Payer: Medicaid Other | Admitting: Anesthesiology

## 2015-09-15 ENCOUNTER — Inpatient Hospital Stay
Admission: EM | Admit: 2015-09-15 | Discharge: 2015-09-17 | DRG: 775 | Disposition: A | Discharge status: Home / Self Care | Payer: Medicaid Other | Attending: Certified Nurse Midwife | Admitting: Certified Nurse Midwife

## 2015-09-15 DIAGNOSIS — F12929 Cannabis use, unspecified with intoxication, unspecified: Secondary | ICD-10-CM | POA: Diagnosis present

## 2015-09-15 DIAGNOSIS — Z833 Family history of diabetes mellitus: Secondary | ICD-10-CM

## 2015-09-15 DIAGNOSIS — O480 Post-term pregnancy: Principal | ICD-10-CM | POA: Diagnosis not present

## 2015-09-15 DIAGNOSIS — O99324 Drug use complicating childbirth: Secondary | ICD-10-CM | POA: Diagnosis present

## 2015-09-15 DIAGNOSIS — O99334 Smoking (tobacco) complicating childbirth: Secondary | ICD-10-CM | POA: Diagnosis present

## 2015-09-15 DIAGNOSIS — F17210 Nicotine dependence, cigarettes, uncomplicated: Secondary | ICD-10-CM | POA: Diagnosis present

## 2015-09-15 DIAGNOSIS — O99824 Streptococcus B carrier state complicating childbirth: Secondary | ICD-10-CM | POA: Diagnosis present

## 2015-09-15 DIAGNOSIS — Z8249 Family history of ischemic heart disease and other diseases of the circulatory system: Secondary | ICD-10-CM | POA: Diagnosis not present

## 2015-09-15 DIAGNOSIS — Z3A40 40 weeks gestation of pregnancy: Secondary | ICD-10-CM | POA: Diagnosis not present

## 2015-09-15 DIAGNOSIS — Z3490 Encounter for supervision of normal pregnancy, unspecified, unspecified trimester: Secondary | ICD-10-CM

## 2015-09-15 DIAGNOSIS — O0930 Supervision of pregnancy with insufficient antenatal care, unspecified trimester: Secondary | ICD-10-CM | POA: Diagnosis not present

## 2015-09-15 DIAGNOSIS — Z809 Family history of malignant neoplasm, unspecified: Secondary | ICD-10-CM

## 2015-09-15 DIAGNOSIS — Z822 Family history of deafness and hearing loss: Secondary | ICD-10-CM | POA: Diagnosis not present

## 2015-09-15 DIAGNOSIS — O479 False labor, unspecified: Secondary | ICD-10-CM | POA: Diagnosis present

## 2015-09-15 LAB — URINE DRUG SCREEN, QUALITATIVE (ARMC ONLY)
Amphetamines, Ur Screen: NOT DETECTED
BARBITURATES, UR SCREEN: NOT DETECTED
BENZODIAZEPINE, UR SCRN: NOT DETECTED
CANNABINOID 50 NG, UR ~~LOC~~: POSITIVE — AB
Cocaine Metabolite,Ur ~~LOC~~: NOT DETECTED
MDMA (Ecstasy)Ur Screen: NOT DETECTED
METHADONE SCREEN, URINE: NOT DETECTED
OPIATE, UR SCREEN: NOT DETECTED
Phencyclidine (PCP) Ur S: NOT DETECTED
TRICYCLIC, UR SCREEN: NOT DETECTED

## 2015-09-15 LAB — CBC
HCT: 37.2 % (ref 35.0–47.0)
HEMOGLOBIN: 12.6 g/dL (ref 12.0–16.0)
MCH: 31.1 pg (ref 26.0–34.0)
MCHC: 33.9 g/dL (ref 32.0–36.0)
MCV: 91.7 fL (ref 80.0–100.0)
Platelets: 168 10*3/uL (ref 150–440)
RBC: 4.06 MIL/uL (ref 3.80–5.20)
RDW: 13.5 % (ref 11.5–14.5)
WBC: 15.6 10*3/uL — AB (ref 3.6–11.0)

## 2015-09-15 LAB — BASIC METABOLIC PANEL
Anion gap: 9 (ref 5–15)
BUN: 7 mg/dL (ref 6–20)
CALCIUM: 9 mg/dL (ref 8.9–10.3)
CO2: 21 mmol/L — ABNORMAL LOW (ref 22–32)
CREATININE: 0.52 mg/dL (ref 0.44–1.00)
Chloride: 107 mmol/L (ref 101–111)
GFR calc non Af Amer: >60 mL/min (ref >60)
Glucose, Bld: 90 mg/dL (ref 65–99)
Potassium: 3.5 mmol/L (ref 3.5–5.1)
SODIUM: 137 mmol/L (ref 135–145)

## 2015-09-15 LAB — TYPE AND SCREEN
ABO/RH(D): O POS
ANTIBODY SCREEN: NEGATIVE

## 2015-09-15 LAB — ABO/RH: ABO/RH(D): O POS

## 2015-09-15 MED ORDER — BUPIVACAINE HCL (PF) 0.25 % IJ SOLN
INTRAMUSCULAR | Status: DC | PRN
  Administered 2015-09-15: 5 mL via EPIDURAL

## 2015-09-15 MED ORDER — LEVALBUTEROL HCL 1.25 MG/0.5ML IN NEBU
1.2500 mg | INHALATION_SOLUTION | Freq: Four times a day (QID) | RESPIRATORY_TRACT | Status: DC
Start: 2015-09-15 — End: 2015-09-17
  Administered 2015-09-15 – 2015-09-17 (×8): 1.25 mg via RESPIRATORY_TRACT
  Filled 2015-09-15 (×10): qty 0.5

## 2015-09-15 MED ORDER — LIDOCAINE HCL (PF) 1 % IJ SOLN
30.0000 mL | INTRAMUSCULAR | Status: AC | PRN
  Administered 2015-09-15: 3 mL via SUBCUTANEOUS
  Filled 2015-09-15: qty 30

## 2015-09-15 MED ORDER — OXYTOCIN 40 UNITS IN LACTATED RINGERS INFUSION - SIMPLE MED
62.5000 mL/h | INTRAVENOUS | Status: DC
  Filled 2015-09-15 (×2): qty 1000

## 2015-09-15 MED ORDER — LACTATED RINGERS IV SOLN
INTRAVENOUS | Status: DC
  Administered 2015-09-15: 125 mL/h via INTRAVENOUS

## 2015-09-15 MED ORDER — ONDANSETRON HCL 4 MG/2ML IJ SOLN
4.0000 mg | Freq: Four times a day (QID) | INTRAMUSCULAR | Status: DC | PRN
  Filled 2015-09-15: qty 2

## 2015-09-15 MED ORDER — BUTORPHANOL TARTRATE 1 MG/ML IJ SOLN
1.0000 mg | INTRAMUSCULAR | Status: DC | PRN
  Administered 2015-09-15: 1 mg via INTRAVENOUS

## 2015-09-15 MED ORDER — MISOPROSTOL 200 MCG PO TABS
800.0000 ug | ORAL_TABLET | ORAL | Status: DC
  Filled 2015-09-15: qty 4

## 2015-09-15 MED ORDER — BUTORPHANOL TARTRATE 1 MG/ML IJ SOLN
INTRAMUSCULAR | Status: AC
  Administered 2015-09-15: 1 mg via INTRAVENOUS
  Filled 2015-09-15: qty 1

## 2015-09-15 MED ORDER — FENTANYL 2.5 MCG/ML W/ROPIVACAINE 0.2% IN NS 100 ML EPIDURAL INFUSION (ARMC-ANES)
9.0000 mL/h | EPIDURAL | Status: DC
  Administered 2015-09-15: 9 mL/h via EPIDURAL

## 2015-09-15 MED ORDER — AMMONIA AROMATIC IN INHA
0.3000 mL | Freq: Once | RESPIRATORY_TRACT | Status: DC | PRN
  Filled 2015-09-15: qty 10

## 2015-09-15 MED ORDER — DIPHENHYDRAMINE HCL 50 MG/ML IJ SOLN: 12.5000 mg | INTRAMUSCULAR | Status: DC | PRN

## 2015-09-15 MED ORDER — PHENYLEPHRINE 40 MCG/ML (10ML) SYRINGE FOR IV PUSH (FOR BLOOD PRESSURE SUPPORT)
80.0000 ug | PREFILLED_SYRINGE | INTRAVENOUS | Status: DC | PRN
  Filled 2015-09-15: qty 2

## 2015-09-15 MED ORDER — SODIUM CHLORIDE 0.9 % IV SOLN
2.0000 g | Freq: Once | INTRAVENOUS | Status: AC
  Administered 2015-09-15: 2 g via INTRAVENOUS
  Filled 2015-09-15: qty 2000

## 2015-09-15 MED ORDER — LIDOCAINE-EPINEPHRINE (PF) 1.5 %-1:200000 IJ SOLN
INTRAMUSCULAR | Status: DC | PRN
  Administered 2015-09-15: 4 mL via PERINEURAL

## 2015-09-15 MED ORDER — SODIUM CHLORIDE 0.9 % IV SOLN
1.0000 g | INTRAVENOUS | Status: DC
  Administered 2015-09-15: 1 g via INTRAVENOUS
  Filled 2015-09-15 (×7): qty 1000

## 2015-09-15 MED ORDER — EPHEDRINE 5 MG/ML INJ
10.0000 mg | INTRAVENOUS | Status: DC | PRN
  Filled 2015-09-15: qty 2

## 2015-09-15 MED ORDER — LACTATED RINGERS IV SOLN: 500.0000 mL | INTRAVENOUS | Status: DC | PRN

## 2015-09-15 MED ORDER — FENTANYL 2.5 MCG/ML W/ROPIVACAINE 0.2% IN NS 100 ML EPIDURAL INFUSION (ARMC-ANES)
EPIDURAL | Status: AC
  Administered 2015-09-15: 250 ug via EPIDURAL
  Filled 2015-09-15: qty 100

## 2015-09-15 MED ORDER — OXYTOCIN BOLUS FROM INFUSION: 500.0000 mL | INTRAVENOUS | Status: DC

## 2015-09-15 NOTE — OB Triage Note (Signed)
Pt presents to hospital stating that her water broke around 1545 this afternoon.  She reports a two smaller gushes after the initial gush and then nothing since.  Pt reports having intercourse last night.  She reports ctx q 6 minute and reports increasing intensity.

## 2015-09-15 NOTE — Anesthesia Procedure Notes (Signed)
Epidural Patient location during procedure: OB  Preanesthetic Checklist Completed: patient identified, site marked, surgical consent, pre-op evaluation, timeout performed, IV checked, risks and benefits discussed and monitors and equipment checked  Epidural Patient position: sitting Prep: Betadine Patient monitoring: heart rate, continuous pulse ox and blood pressure Approach: midline Location: L4-L5 Injection technique: LOR air  Needle:  Needle type: Tuohy  Needle gauge: 18 G Needle length: 9 cm and 9 Needle insertion depth: 5 cm Catheter type: closed end flexible Catheter size: 20 Guage Catheter at skin depth: 10 cm Test dose: negative and 1.5% lidocaine with Epi 1:200 K  Assessment Sensory level: T10 Events: blood not aspirated, injection not painful, no injection resistance, negative IV test and no paresthesia  Additional Notes Pt's history reviewed and consent obtained as per OB consent Patient tolerated the insertion well without complications. Negative SATD, negative IVTD All VSS were obtained and monitored through OBIX and nursing protocols followed.Reason for block:procedure for pain   

## 2015-09-15 NOTE — H&P (Addendum)
Marta Antu, CNM Certified Nurse Midwife Signed Obstetrics/Gynecology Discharge Summaries 09/08/2015 11:56 AM  Related encounter: ED to Hosp-Admission (Discharged) from 09/08/2015 in [facility]    Expand All Collapse All   Obstetric History and Physical  Betty Gomez is a 29 y.o. [id number] with Estimated Date of Delivery: 09/16/15 per 7 week [place] who presents at [redacted]w[redacted]d presenting for c/o leaking fluid and contractions. Had a gush of fluid at 1545 today and some leakage of fluid since then.  Patient states she has been having irregular contractions, no vaginal bleeding,   and active fetal movement. She also  c/o sore throat, cough, wheezing x1 day. Used her mother's  Ventolin inhaler once today and also Chloraseptic spray. Feels warm but has not taken temp at home. Afebrile here.  Prenatal Course Source of Care: [facility] with onset of care at 21 weeks, no care x 10 weeks, then no care since 34 weeks.   Pregnancy complications or risks: 1. AFP Tetra: increased T 18 risk, negative Informaseq and normal [place] at MFM 2. Late to care and gaps in care (only 5 prenatal visits) 3. Marijuana and cigarette use  Patient Active Problem List   Diagnosis Date Noted  . Braxton Hicks contractions 09/08/2015   She plans to bottle feed She desires bilateral tubal ligation, Nexplanon for postpartum contraception.   Prenatal labs and studies: ABO, Rh: O+  Antibody: negative Rubella: Non-immune Varicella: Immune RPR: Non-reactive HBsAg: Negative HIV: Negative GC/CT: neg/neg WGN:FAOZHYQM 1 hr Glucola: 1 hr: 154 (done at 34 weeks, has not returned to office for 3 hr GTT  Genetic screening: negative Informaseq    Prenatal Transfer Tool   Past Medical History  Diagnosis Date  . Asthma   . Depression     ok, came off meds, has someone she sees for this.  . Endometriosis   . Anxiety     Past Surgical History  Procedure  Laterality Date  . Diagnostic laparoscopy      endometriosis    OB History  Gravida Para Term Preterm AB SAB TAB Ectopic Multiple Living  0 0 0 0 0 0 2    # Outcome Date GA Lbr Len/2nd Weight Sex Delivery Anes PTL Lv  3 Current           2 Term     M  EPI N Y  1 Term     M   N Y      Social History   Social History  . Marital Status: Single    Spouse Name: N/A  . Number of Children: N/A  . Years of Education: N/A   Social History Main Topics  . Smoking status: Light Tobacco Smoker -- 0.25 packs/day for 10 years    Types: Cigarettes  . Smokeless tobacco: Never Used  . Alcohol Use: No  . Drug Use: Yes    Special: Marijuana - reports has d/c  . Sexual Activity: Yes   Other Topics Concern  . None   Social History Narrative    Family History  Problem Relation Age of Onset  . Hypertension Mother   . Diabetes Mother   . Heart disease Mother 28    failure  . Heart disease Maternal Grandfather   . Diabetes Paternal Grandmother   . Hypertension Paternal Grandmother   . Cancer Paternal Grandfather     lung  . Hearing loss Neg Hx     Prescriptions prior to admission  Medication Sig Dispense Refill Last Dose  . Prenatal Vit-Fe Fumarate-FA (PRENATAL MULTIVITAMIN) TABS tablet Take 1 tablet by mouth daily at 12 noon.   09/07/2015 at Unknown time    No Known Allergies  Review of Systems: Negative except for what is mentioned in HPI.  Physical Exam: BP 135/82 mmHg  Pulse 96  Temp(Src) 97.8 F (36.6 C) (Oral)  SpO2 95%  LMP 12/16/2014 (Approximate)) GENERAL: Well-developed, well-nourished female in no acute distress. Will occasionally breathe thru contractions Neck: no LAN Throat: no inflammation of exudates Heart: RRR without murmur Lungs: wheezing bilaterally  Lt>RT ABDOMEN: Soft, nontender, nondistended, gravid. EFW= 7#14oz EXTREMITIES: Nontender, no edema WUJ:WJXBJYNW Nitrazine, negative pooling (mucoid discharge) positive fern, positive immotile sperm Cervical Exam: Dilatation 4.5 cm Effacement 80% Station -1, Forebag present Presentation: cephalic FHT: Category: 1 Baseline rate 145 Variability moderate Accelerations presentto 170s. Decelerations none Contractions: irregular (q2-7 min apart, some coupling) DTRs +1  Pertinent Labs/Studies:  Results for orders placed or performed during the hospital encounter of 09/15/15 (from the past 24 hour(s))  CBC     Status: Abnormal   Collection Time: 09/15/15  6:33 PM  Result Value Ref Range   WBC 15.6 (H) 3.6 - 11.0 K/uL   RBC 4.06 3.80 - 5.20 MIL/uL   Hemoglobin 12.6 12.0 - 16.0 g/dL   HCT 29.5 62.1 - 30.8 %   MCV 91.7 80.0 - 100.0 fL   MCH 31.1 26.0 - 34.0 pg   MCHC 33.9 32.0 - 36.0 g/dL   RDW 65.7 84.6 - 96.2 %   Platelets 168 150 - 440 K/uL  Basic metabolic panel     Status: Abnormal   Collection Time: 09/15/15  6:33 PM  Result Value Ref Range   Sodium 137 135 - 145 mmol/L   Potassium 3.5 3.5 - 5.1 mmol/L   Chloride 107 101 - 111 mmol/L   CO2 21 (L) 22 - 32 mmol/L   Glucose, Bld 90 65 - 99 mg/dL   BUN 7 6 - 20 mg/dL   Creatinine, Ser 9.52 0.44 - 1.00 mg/dL   Calcium 9.0 8.9 - 84.1 mg/dL   GFR calc non Af Amer >60 >60 mL/min   GFR calc Af Amer >60 >60 mL/min   Anion gap 9 5 - 15    Assessment : IUP at [redacted]w[redacted]d with PROM and possible early labor CAt 1 tracing GBS positive URI/reactive airway Desires postpartum sterilization and 30 day papers signed   Plan: Admit, labs including CBC, T&S, BMP Ampicillin for GBS coverage Epidural when desired AROM forebag after 4 hours on antibiotic, then Pitocin if needed Nebulizer treatment for wheezing UDS obtained also for h/o marijuana use  Betty Gomez, CNM           Cosigned by: Vena Austria, MD at  09/13/2015 6:04 PM  Revision History     Date/Time User Provider Type Action   09/13/2015 6:04 PM Vena Austria, MD Physician Cosign   09/08/2015 12:17 PM Marta Antu, CNM Certified Nurse Midwife Sign    Routing History     Date/Time From To Method   09/13/2015 6:04 PM Vena Austria, MD Marta Antu, CNM Fax

## 2015-09-15 NOTE — Anesthesia Preprocedure Evaluation (Signed)
Anesthesia Evaluation  Patient identified by MRN, date of birth, ID band Patient awake    Reviewed: Allergy & Precautions, H&P , NPO status , Patient's Chart, lab work & pertinent test results, reviewed documented beta blocker date and time   Airway Mallampati: III  TM Distance: >3 FB Neck ROM: full    Dental no notable dental hx. (+) Teeth Intact   Pulmonary neg pulmonary ROS, Current Smoker,    Pulmonary exam normal breath sounds clear to auscultation       Cardiovascular Exercise Tolerance: Good negative cardio ROS Normal cardiovascular exam Rhythm:regular Rate:Normal     Neuro/Psych PSYCHIATRIC DISORDERS negative neurological ROS  negative psych ROS   GI/Hepatic negative GI ROS, Neg liver ROS,   Endo/Other  negative endocrine ROS  Renal/GU negative Renal ROS  negative genitourinary   Musculoskeletal   Abdominal   Peds  Hematology negative hematology ROS (+)   Anesthesia Other Findings   Reproductive/Obstetrics (+) Pregnancy                             Anesthesia Physical Anesthesia Plan  ASA: II  Anesthesia Plan: Regional and Epidural   Post-op Pain Management:    Induction:   Airway Management Planned:   Additional Equipment:   Intra-op Plan:   Post-operative Plan:   Informed Consent: I have reviewed the patients History and Physical, chart, labs and discussed the procedure including the risks, benefits and alternatives for the proposed anesthesia with the patient or authorized representative who has indicated his/her understanding and acceptance.     Plan Discussed with: CRNA  Anesthesia Plan Comments:         Anesthesia Quick Evaluation

## 2015-09-16 ENCOUNTER — Encounter: Payer: Self-pay | Admitting: Certified Nurse Midwife

## 2015-09-16 LAB — CBC
HEMATOCRIT: 33.1 % — AB (ref 35.0–47.0)
HEMOGLOBIN: 11.1 g/dL — AB (ref 12.0–16.0)
MCH: 31.1 pg (ref 26.0–34.0)
MCHC: 33.7 g/dL (ref 32.0–36.0)
MCV: 92.4 fL (ref 80.0–100.0)
Platelets: 155 10*3/uL (ref 150–440)
RBC: 3.58 MIL/uL — ABNORMAL LOW (ref 3.80–5.20)
RDW: 13.5 % (ref 11.5–14.5)
WBC: 15.1 10*3/uL — ABNORMAL HIGH (ref 3.6–11.0)

## 2015-09-16 LAB — RPR: RPR: NONREACTIVE

## 2015-09-16 MED ORDER — PSEUDOEPHEDRINE HCL 30 MG PO TABS
30.0000 mg | ORAL_TABLET | Freq: Four times a day (QID) | ORAL | Status: DC | PRN
  Administered 2015-09-16 (×2): 30 mg via ORAL
  Filled 2015-09-16 (×2): qty 1

## 2015-09-16 MED ORDER — SIMETHICONE 80 MG PO CHEW: 80.0000 mg | CHEWABLE_TABLET | ORAL | Status: DC | PRN

## 2015-09-16 MED ORDER — DIBUCAINE 1 % RE OINT: 1.0000 "application " | TOPICAL_OINTMENT | RECTAL | Status: DC | PRN

## 2015-09-16 MED ORDER — ONDANSETRON HCL 4 MG/2ML IJ SOLN: 4.0000 mg | INTRAMUSCULAR | Status: DC | PRN

## 2015-09-16 MED ORDER — BENZOCAINE-MENTHOL 20-0.5 % EX AERO: 1.0000 "application " | INHALATION_SPRAY | CUTANEOUS | Status: DC | PRN

## 2015-09-16 MED ORDER — SALINE SPRAY 0.65 % NA SOLN
1.0000 | NASAL | Status: DC | PRN
  Filled 2015-09-16: qty 44

## 2015-09-16 MED ORDER — OXYCODONE-ACETAMINOPHEN 5-325 MG PO TABS
1.0000 | ORAL_TABLET | ORAL | Status: DC | PRN
  Administered 2015-09-16 – 2015-09-17 (×4): 1 via ORAL
  Filled 2015-09-16 (×3): qty 1

## 2015-09-16 MED ORDER — IBUPROFEN 600 MG PO TABS
600.0000 mg | ORAL_TABLET | Freq: Four times a day (QID) | ORAL | Status: DC
  Administered 2015-09-16 – 2015-09-17 (×5): 600 mg via ORAL
  Filled 2015-09-16 (×5): qty 1

## 2015-09-16 MED ORDER — OXYTOCIN 40 UNITS IN LACTATED RINGERS INFUSION - SIMPLE MED
62.5000 mL/h | INTRAVENOUS | Status: DC | PRN
  Administered 2015-09-16: 62.5 mL/h via INTRAVENOUS
  Filled 2015-09-16: qty 1000

## 2015-09-16 MED ORDER — DOCUSATE SODIUM 100 MG PO CAPS
100.0000 mg | ORAL_CAPSULE | Freq: Two times a day (BID) | ORAL | Status: DC
  Administered 2015-09-16: 100 mg via ORAL
  Filled 2015-09-16 (×2): qty 1

## 2015-09-16 MED ORDER — PRENATAL MULTIVITAMIN CH
1.0000 | ORAL_TABLET | Freq: Every day | ORAL | Status: DC
  Administered 2015-09-16: 1 via ORAL
  Filled 2015-09-16 (×2): qty 1

## 2015-09-16 MED ORDER — WITCH HAZEL-GLYCERIN EX PADS: 1.0000 "application " | MEDICATED_PAD | CUTANEOUS | Status: DC | PRN

## 2015-09-16 MED ORDER — FERROUS SULFATE 325 (65 FE) MG PO TABS
325.0000 mg | ORAL_TABLET | Freq: Every day | ORAL | Status: DC
  Filled 2015-09-16: qty 1

## 2015-09-16 MED ORDER — ONDANSETRON HCL 4 MG PO TABS: 4.0000 mg | ORAL_TABLET | ORAL | Status: DC | PRN

## 2015-09-16 MED ORDER — LANOLIN HYDROUS EX OINT: TOPICAL_OINTMENT | CUTANEOUS | Status: DC | PRN

## 2015-09-16 MED ORDER — OXYCODONE-ACETAMINOPHEN 5-325 MG PO TABS
2.0000 | ORAL_TABLET | ORAL | Status: DC | PRN
Start: 2015-09-16 — End: 2015-09-17
  Administered 2015-09-16 – 2015-09-17 (×2): 2 via ORAL
  Filled 2015-09-16 (×2): qty 2

## 2015-09-16 NOTE — Care Management (Signed)
Received referral for wanting more information about medicaid. Spoke with Ms. Demarco at the bedside. States she has maternity medicaid. Also, she has 2 others boys ages 28 & 8. Discussed that she would need to call the Department of Social Services and arrange and appointment to apply for new baby boy "Betty Gomez." Gwenette Greet RN MSN Care Management [phone]

## 2015-09-16 NOTE — Discharge Summary (Signed)
Physician Obstetric Discharge Summary  Patient ID: Betty Gomez MRN: [id number] DOB/AGE: 1986-02-29 29 y.o.   Date of Admission: 09/15/2015  Date of Discharge:   Admitting Diagnosis: Onset of Labor at [redacted]w[redacted]d Secondary Diagnosis: Insufficient prenatal care  Mode of Delivery: normal spontaneous vaginal delivery on 09/17/2015     Discharge Diagnosis: No other diagnosis   Intrapartum Procedures: epidural and GBS prophylaxis   Post partum procedures: Influenza vaccination, MMR  Complications: none   Brief Hospital Course  Betty ARMETTAis a [id number] had a SVD on 09/18/2015;  for further details of this delivery, please refer to the delivery note.  Patient had an uncomplicated postpartum course.  By time of discharge on PPD#2, her pain was controlled on oral pain medications; she had appropriate lochia and was ambulating, voiding without difficulty and tolerating regular diet.  She was deemed stable for discharge to home.    Labs: CBC Latest Ref Rng 09/16/2015 09/15/2015 03/13/2015  WBC 3.6 - 11.0 K/uL 15.1(H) 15.6(H) 10.4  Hemoglobin 12.0 - 16.0 g/dL 11.1(L) 12.6 12.2  Hematocrit 35.0 - 47.0 % 33.1(L) 37.2 35.5(L)  Platelets 150 - 440 K/uL 155 168 202   O POS  Physical exam:  Blood pressure 115/74, pulse 75, temperature 98.1 F (36.7 C), temperature source Oral, resp. rate 18, height _0  (1.676 m), last menstrual period 12/16/2014, SpO2 98 %, unknown if currently breastfeeding. General: alert and no distress Lochia: appropriate Abdomen: soft, NT Uterine Fundus: firm Extremities: No evidence of DVT seen on physical exam. No lower extremity edema.  Discharge Instructions: Per After Visit Summary. Activity: Advance as tolerated. Pelvic rest for 6 weeks.  Also refer to Discharge Instructions Diet: Regular Medications:   Medication List    TAKE these medications        acetaminophen 500 MG tablet  Commonly known as:  TYLENOL  Take 1,000 mg by mouth every 6 (six)  hours as needed for headache.     ibuprofen 600 MG tablet  Commonly known as:  ADVIL,MOTRIN  Take 1 tablet (600 mg total) by mouth every 6 (six) hours.     prenatal multivitamin Tabs tablet  Take 1 tablet by mouth daily at 12 noon.       Outpatient follow up:  Follow-up Information    Follow up with SDorthula Nettles MD In 2 weeks.   Specialty:  Obstetrics and Gynecology   Contact information:   [id number]     Postpartum contraception: interval tubal  Discharged Condition: good  Discharged to: home   Newborn Data: Disposition:home with mother  Apgars: APGAR (1 MIN): 8   APGAR (5 MINS): 9   APGAR (10 MINS):    Baby Feeding: Bottle  SDorthula Nettles MD 09/17/2015 10:17 AM

## 2015-09-17 ENCOUNTER — Encounter: Payer: Self-pay | Admitting: *Deleted

## 2015-09-17 ENCOUNTER — Encounter
Admission: EM | Disposition: A | Discharge status: Home / Self Care | Payer: Self-pay | Source: Home / Self Care | Attending: Certified Nurse Midwife

## 2015-09-17 HISTORY — PX: TUBAL LIGATION: SHX77

## 2015-09-17 SURGERY — LIGATION, FALLOPIAN TUBE, POSTPARTUM
Anesthesia: Choice

## 2015-09-17 MED ORDER — IBUPROFEN 600 MG PO TABS: 600.0000 mg | ORAL_TABLET | Freq: Four times a day (QID) | ORAL | Status: DC

## 2015-09-17 MED ORDER — MEASLES, MUMPS & RUBELLA VAC ~~LOC~~ INJ: 0.5000 mL | INJECTION | Freq: Once | SUBCUTANEOUS | Status: DC

## 2015-09-17 SURGICAL SUPPLY — 25 items
CHLORAPREP W/TINT 26ML (MISCELLANEOUS) IMPLANT
DRAPE LAPAROTOMY 100X77 ABD (DRAPES) IMPLANT
DRESSING TELFA 4X3 1S ST N-ADH (GAUZE/BANDAGES/DRESSINGS) IMPLANT
DRSG TEGADERM 2-3/8X2-3/4 SM (GAUZE/BANDAGES/DRESSINGS) IMPLANT
GLOVE BIO SURGEON STRL SZ7 (GLOVE) IMPLANT
GLOVE INDICATOR 7.5 STRL GRN (GLOVE) IMPLANT
GOWN STRL REUS W/ TWL LRG LVL3 (GOWN DISPOSABLE) IMPLANT
GOWN STRL REUS W/TWL LRG LVL3 (GOWN DISPOSABLE)
LABEL OR SOLS (LABEL) IMPLANT
LIQUID BAND (GAUZE/BANDAGES/DRESSINGS) IMPLANT
NDL SAFETY 22GX1.5 (NEEDLE) IMPLANT
NS IRRIG 500ML POUR BTL (IV SOLUTION) IMPLANT
PACK BASIN MINOR ARMC (MISCELLANEOUS) IMPLANT
PAD GROUND ADULT SPLIT (MISCELLANEOUS) IMPLANT
SPONGE LAP 4X18 5PK (MISCELLANEOUS) IMPLANT
STRAP SAFETY BODY (MISCELLANEOUS) IMPLANT
SUT CHROMIC 5 0 RB 1 27 (SUTURE) IMPLANT
SUT CHROMIC GUT BROWN 0 54 (SUTURE) IMPLANT
SUT CHROMIC GUT BROWN 0 54IN (SUTURE)
SUT MNCRL 4-0 (SUTURE)
SUT MNCRL 4-0 27XMFL (SUTURE)
SUT VIC AB 0 UR5 27 (SUTURE) IMPLANT
SUT VIC AB 2-0 UR6 27 (SUTURE) IMPLANT
SUTURE MNCRL 4-0 27XMF (SUTURE) IMPLANT
SYRINGE 10CC LL (SYRINGE) IMPLANT

## 2015-09-17 NOTE — Progress Notes (Signed)
Patient understands all discharge instructions and the need to make follow up appointments. Patient discharge via wheelchair with auxillary. 

## 2015-09-17 NOTE — Anesthesia Postprocedure Evaluation (Signed)
  Anesthesia Post-op Note  Patient: Betty Gomez  Procedure(s) Performed: * No procedures listed *  Anesthesia type:Regional, Epidural  Patient location: 337  Post pain: Pain level controlled  Post assessment: Post-op Vital signs reviewed, Patient's Cardiovascular Status Stable, Respiratory Function Stable, Patent Airway and No signs of Nausea or vomiting  Post vital signs: Reviewed and stable  Last Vitals:  Filed Vitals:   09/17/15 0700  BP: 115/74  Pulse: 75  Temp: 36.7 C  Resp: 18    Level of consciousness: awake, alert  and patient cooperative  Complications: No apparent anesthesia complications

## 2015-09-17 NOTE — Discharge Instructions (Addendum)

## 2015-09-18 ENCOUNTER — Encounter: Payer: Self-pay | Admitting: Obstetrics and Gynecology

## 2015-09-18 LAB — CULTURE, GROUP A STREP (THRC)

## 2015-11-14 ENCOUNTER — Ambulatory Visit: Payer: Medicaid Other | Admitting: Sports Medicine

## 2015-12-05 ENCOUNTER — Ambulatory Visit: Payer: Medicaid Other | Admitting: Sports Medicine

## 2015-12-07 NOTE — L&D Delivery Note (Signed)
Delivery Note Primary OB: [facility] Delivery Physician: Annamarie MajorPaul Trygve Thal, MD Gestational Age: Full term Antepartum complications: Single Umbilical Artery, anemia, asthma, obesity, substance use, history of infant with GBS sepsis Intrapartum complications: precipitous delivery  A viable Female was delivered via vertex perentation.  Apgars:9 ,9  Weight:  7 lb 2 oz .   Placenta status: spontaneous and Intact.  Cord: 2 vessels;  with the following complications: none.  Anesthesia:  none Episiotomy:  none Lacerations:  none Suture Repair: none Est. Blood Loss (mL):  200 mL  Mom to postpartum.  Baby to Couplet care / Skin to Skin. Plans post partum BTL.  Annamarie MajorPaul Seleny Allbright, MD Dept of OB/GYN [phone]

## 2015-12-16 ENCOUNTER — Ambulatory Visit (INDEPENDENT_AMBULATORY_CARE_PROVIDER_SITE_OTHER): Payer: Medicaid Other | Admitting: Sports Medicine

## 2015-12-16 ENCOUNTER — Encounter: Payer: Self-pay | Admitting: Sports Medicine

## 2015-12-16 DIAGNOSIS — M79672 Pain in left foot: Secondary | ICD-10-CM | POA: Diagnosis not present

## 2015-12-16 DIAGNOSIS — Q828 Other specified congenital malformations of skin: Secondary | ICD-10-CM

## 2015-12-16 NOTE — Progress Notes (Signed)
Patient ID: Betty Gomez, female   DOB: [birth year], 28 y.o.   MRN: [id number] Subjective: Betty Gomez is a 30 y.o. female patient who presents to office for evaluation of Left foot pain. Patient complains of pain at the lesion Left foot at the ball of the foot getting worse especially after gaining a lot of weight and having her last child 4 months ago. Pain is now interferring with daily activities. Patient has tried soaks, massage, and motrin with a little relief. Patient denies any other pedal complaints.   Patient Active Problem List   Diagnosis Date Noted  . Pregnancy 09/15/2015  . Insufficient prenatal care 09/15/2015  . Indication for care in labor or delivery 09/10/2015  . Braxton Hicks contractions 09/08/2015  . Candida albicans infection 08/16/2015  . Abnormal antenatal AFP screen 05/24/2015  . Irregular contractions 05/24/2015   Current Outpatient Prescriptions on File Prior to Visit  Medication Sig Dispense Refill  . acetaminophen (TYLENOL) 500 MG tablet Take 1,000 mg by mouth every 6 (six) hours as needed for headache.    . ibuprofen (ADVIL,MOTRIN) 600 MG tablet Take 1 tablet (600 mg total) by mouth every 6 (six) hours. 30 tablet 0  . Prenatal Vit-Fe Fumarate-FA (PRENATAL MULTIVITAMIN) TABS tablet Take 1 tablet by mouth daily at 12 noon.     No current facility-administered medications on file prior to visit.   No Known Allergies   Objective:  General: Alert and oriented x3 in no acute distress  Dermatology: Keratotic lesion present measuring<0.5cm with skin lines transversing the lesion, pain is present with direct pressure to the lesion with central nucleated core consistent with porokeratosis sub 2nd left foot, no sign of infection, no webspace macerations, no ecchymosis bilateral, all nails x 10 are well manicured.  Vascular: Dorsalis Pedis and Posterior Tibial pedal pulses 2/4, Capillary Fill Time 3 seconds, + pedal hair growth bilateral, no edema bilateral lower  extremities, Temperature gradient within normal limits.  Neurology: Johney Maine sensation intact via light touch bilateral.  Musculoskeletal: Mild tenderness with palpation at the keratosis on Left, Muscular strength 5/5 in all groups without pain or limitation on range of motion. No lower extremity muscular or boney deformity noted.  Assessment and Plan: Problem List Items Addressed This Visit    None    Visit Diagnoses    Porokeratosis    -  Primary    Left sub 2nd    Left foot pain          -Complete examination performed -Discussed treatement options -Debrided keratoic lesion using a chisel blade; treated the area with Salinocaine covered with bandaid may remove on tomorrow -Gave patient met pad with instructions on use to help offload area -Advised patient that if lesions comes back quickly to consider excision -Patient to return to office in 4 weeks or sooner if condition worsens.  Landis Martins, DPM

## 2015-12-16 NOTE — Progress Notes (Deleted)
   Subjective:    Patient ID: Betty Gomez, female    DOB: Mar 19, 1986, 30 y.o.   MRN: [id number]  HPI    Review of Systems  All other systems reviewed and are negative.      Objective:   Physical Exam        Assessment & Plan:

## 2016-01-13 ENCOUNTER — Ambulatory Visit: Payer: Medicaid Other | Admitting: Sports Medicine

## 2016-03-31 ENCOUNTER — Emergency Department
Admission: EM | Admit: 2016-03-31 | Discharge: 2016-03-31 | Disposition: A | Discharge status: Home / Self Care | Payer: Medicaid Other | Attending: Emergency Medicine | Admitting: Emergency Medicine

## 2016-03-31 ENCOUNTER — Encounter: Payer: Self-pay | Admitting: Medical Oncology

## 2016-03-31 DIAGNOSIS — Z3490 Encounter for supervision of normal pregnancy, unspecified, unspecified trimester: Secondary | ICD-10-CM

## 2016-03-31 DIAGNOSIS — Z3A00 Weeks of gestation of pregnancy not specified: Secondary | ICD-10-CM | POA: Diagnosis not present

## 2016-03-31 DIAGNOSIS — F17210 Nicotine dependence, cigarettes, uncomplicated: Secondary | ICD-10-CM | POA: Diagnosis not present

## 2016-03-31 DIAGNOSIS — F1210 Cannabis abuse, uncomplicated: Secondary | ICD-10-CM | POA: Insufficient documentation

## 2016-03-31 DIAGNOSIS — J45909 Unspecified asthma, uncomplicated: Secondary | ICD-10-CM | POA: Insufficient documentation

## 2016-03-31 DIAGNOSIS — J029 Acute pharyngitis, unspecified: Secondary | ICD-10-CM | POA: Diagnosis not present

## 2016-03-31 DIAGNOSIS — F329 Major depressive disorder, single episode, unspecified: Secondary | ICD-10-CM | POA: Diagnosis not present

## 2016-03-31 DIAGNOSIS — O99519 Diseases of the respiratory system complicating pregnancy, unspecified trimester: Secondary | ICD-10-CM | POA: Diagnosis present

## 2016-03-31 LAB — POCT PREGNANCY, URINE: PREG TEST UR: POSITIVE — AB

## 2016-03-31 LAB — POCT RAPID STREP A: Streptococcus, Group A Screen (Direct): NEGATIVE

## 2016-03-31 MED ORDER — AMOXICILLIN 500 MG PO TABS: 500.0000 mg | ORAL_TABLET | Freq: Two times a day (BID) | ORAL | Status: DC

## 2016-03-31 MED ORDER — LIDOCAINE VISCOUS 2 % MT SOLN: 20.0000 mL | OROMUCOSAL | Status: DC | PRN

## 2016-03-31 NOTE — ED Provider Notes (Signed)
[facility] Emergency Department Provider Note  ____________________________________________  Time seen: Approximately 6:20 PM  I have reviewed the triage vital signs and the nursing notes.   HISTORY  Chief Complaint Sore Throat    HPI Betty Gomez is a 30 y.o. female who presents for sudden onset of sore throat this a.m. Patient states that she's been running low-grade fever. States that it hurts tremendously to swallow. Addition patient elected pregnancy test stating that her lip. Since she.   Past Medical History  Diagnosis Date  . Asthma   . Infection     UTI  . Depression     ok, came off meds, has someone she sees for this.  . Endometriosis   . Anxiety     Patient Active Problem List   Diagnosis Date Noted  . Pregnancy 09/15/2015  . Insufficient prenatal care 09/15/2015  . Indication for care in labor or delivery 09/10/2015  . Braxton Hicks contractions 09/08/2015  . Candida albicans infection 08/16/2015  . Abnormal antenatal AFP screen 05/24/2015  . Irregular contractions 05/24/2015    Past Surgical History  Procedure Laterality Date  . Diagnostic laparoscopy      endometriosis  . Tubal ligation N/A 09/17/2015    Procedure: POST PARTUM TUBAL LIGATION;  Surgeon: Vena Austria, MD;  Location: [facility] ORS;  Service: Gynecology;  Laterality: N/A;    Current Outpatient Rx  Name  Route  Sig  Dispense  Refill  . amoxicillin (AMOXIL) 500 MG tablet   Oral   Take 1 tablet (500 mg total) by mouth 2 (two) times daily.   20 tablet   0   . lidocaine (XYLOCAINE) 2 % solution   Mouth/Throat   Use as directed 20 mLs in the mouth or throat as needed for mouth pain.   100 mL   0     Allergies Review of patient's allergies indicates no known allergies.  Family History  Problem Relation Age of Onset  . Hypertension Mother   . Diabetes Mother   . Heart disease Mother 51    failure  . Heart disease Maternal Grandfather   . Diabetes  Paternal Grandmother   . Hypertension Paternal Grandmother   . Cancer Paternal Grandfather     lung  . Hearing loss Neg Hx     Social History Social History  Substance Use Topics  . Smoking status: Light Tobacco Smoker -- 0.25 packs/day for 10 years    Types: Cigarettes  . Smokeless tobacco: Never Used  . Alcohol Use: No    Review of Systems Constitutional: No fever/chills Eyes: No visual changes. ENT: No sore throat. Cardiovascular: Denies chest pain. Respiratory: Denies shortness of breath. Gastrointestinal: No abdominal pain.  No nausea, no vomiting.  No diarrhea.  No constipation. Genitourinary: Negative for dysuria. Musculoskeletal: Negative for back pain. Skin: Negative for rash. Neurological: Negative for headaches, focal weakness or numbness.  10-point ROS otherwise negative.  ____________________________________________   PHYSICAL EXAM:  VITAL SIGNS: ED Triage Vitals  Enc Vitals Group     BP 03/31/16 1803 122/63 mmHg     Pulse Rate 03/31/16 1803 70     Resp 03/31/16 1803 18     Temp 03/31/16 1803 98.1 F (36.7 C)     Temp Source 03/31/16 1803 Oral     SpO2 03/31/16 1803 94 %     Weight 03/31/16 1803 178 lb (80.74 kg)     Height 03/31/16 1803  (1.676 m)     Head  Cir --      Peak Flow --      Pain Score 03/31/16 1812 9     Pain Loc --      Pain Edu? --      Excl. in GC? --     Constitutional: Alert and oriented. Well appearing and in no acute distress. Eyes: Conjunctivae are normal. PERRL. EOMI. Head: Atraumatic. Nose: No congestion/rhinnorhea. Mouth/Throat: Mucous membranes are moist.  Oropharynx non-erythematous. Neck: No stridor.   Cardiovascular: Normal rate, regular rhythm. Grossly normal heart sounds.  Good peripheral circulation. Respiratory: Normal respiratory effort.  No retractions. Lungs CTAB. Gastrointestinal: Soft and nontender. No distention. No abdominal bruits. No CVA tenderness. Musculoskeletal: No lower extremity tenderness  nor edema.  No joint effusions. Neurologic:  Normal speech and language. No gross focal neurologic deficits are appreciated. No gait instability. Skin:  Skin is warm, dry and intact. No rash noted. Psychiatric: Mood and affect are normal. Speech and behavior are normal.  ____________________________________________   LABS (all labs ordered are listed, but only abnormal results are displayed)  Labs Reviewed  POCT PREGNANCY, URINE - Abnormal; Notable for the following:    Preg Test, Ur POSITIVE (*)    All other components within normal limits  POC URINE PREG, ED  POCT RAPID STREP A      PROCEDURES  Procedure(s) performed: None  Critical Care performed: No  ____________________________________________   INITIAL IMPRESSION / ASSESSMENT AND PLAN / ED COURSE  Pertinent labs & imaging results that were available during my care of the patient were reviewed by me and considered in my medical decision making (see chart for details).  Acute pharyngitis with negative rapid strep. Positive principal test. Rx given for amoxicillin 500 mg twice a day 10 days, viscous lidocaine as needed for throat. Follow up with OB/GYN for pregnancy care. ____________________________________________   FINAL CLINICAL IMPRESSION(S) / ED DIAGNOSES  Final diagnoses:  Acute pharyngitis, unspecified pharyngitis type  Pregnancy     This chart was dictated using voice recognition software/Dragon. Despite best efforts to proofread, errors can occur which can change the meaning. Any change was purely unintentional.   Evangeline Dakinharles M Layson Bertsch, PA-C 03/31/16 1844  Minna AntisKevin Paduchowski, MD 03/31/16 2022

## 2016-03-31 NOTE — Discharge Instructions (Signed)
Pharyngitis Pharyngitis is redness, pain, and swelling (inflammation) of your pharynx.  CAUSES  Pharyngitis is usually caused by infection. Most of the time, these infections are from viruses (viral) and are part of a cold. However, sometimes pharyngitis is caused by bacteria (bacterial). Pharyngitis can also be caused by allergies. Viral pharyngitis may be spread from person to person by coughing, sneezing, and personal items or utensils (cups, forks, spoons, toothbrushes). Bacterial pharyngitis may be spread from person to person by more intimate contact, such as kissing.  SIGNS AND SYMPTOMS  Symptoms of pharyngitis include:   Sore throat.   Tiredness (fatigue).   Low-grade fever.   Headache.  Joint pain and muscle aches.  Skin rashes.  Swollen lymph nodes.  Plaque-like film on throat or tonsils (often seen with bacterial pharyngitis). DIAGNOSIS  Your health care provider will ask you questions about your illness and your symptoms. Your medical history, along with a physical exam, is often all that is needed to diagnose pharyngitis. Sometimes, a rapid strep test is done. Other lab tests may also be done, depending on the suspected cause.  TREATMENT  Viral pharyngitis will usually get better in 3-4 days without the use of medicine. Bacterial pharyngitis is treated with medicines that kill germs (antibiotics).  HOME CARE INSTRUCTIONS   Drink enough water and fluids to keep your urine clear or pale yellow.   Only take over-the-counter or prescription medicines as directed by your health care provider:   If you are prescribed antibiotics, make sure you finish them even if you start to feel better.   Do not take aspirin.   Get lots of rest.   Gargle with 8 oz of salt water ( tsp of salt per 1 qt of water) as often as every 1-2 hours to soothe your throat.   Throat lozenges (if you are not at risk for choking) or sprays may be used to soothe your throat. SEEK MEDICAL  CARE IF:   You have large, tender lumps in your neck.  You have a rash.  You cough up green, yellow-brown, or bloody spit. SEEK IMMEDIATE MEDICAL CARE IF:   Your neck becomes stiff.  You drool or are unable to swallow liquids.  You vomit or are unable to keep medicines or liquids down.  You have severe pain that does not go away with the use of recommended medicines.  You have trouble breathing (not caused by a stuffy nose). MAKE SURE YOU:   Understand these instructions.  Will watch your condition.  Will get help right away if you are not doing well or get worse.   This information is not intended to replace advice given to you by your health care provider. Make sure you discuss any questions you have with your health care provider.   Document Released: 11/22/2005 Document Revised: 09/12/2013 Document Reviewed: 07/30/2013 Elsevier Interactive Patient Education 2016 [employer].  Prenatal Care WHAT IS PRENATAL CARE?  Prenatal care is the process of caring for a pregnant woman before she gives birth. Prenatal care makes sure that she and her baby remain as healthy as possible throughout pregnancy. Prenatal care may be provided by a midwife, family practice health care provider, or a childbirth and pregnancy specialist (obstetrician). Prenatal care may include physical examinations, testing, treatments, and education on nutrition, lifestyle, and social support services. WHY IS PRENATAL CARE SO IMPORTANT?  Early and consistent prenatal care increases the chance that you and your baby will remain healthy throughout your pregnancy. This type of  care also decreases a baby's risk of being born too early (prematurely), or being born smaller than expected (small for gestational age). Any underlying medical conditions you may have that could pose a risk during your pregnancy are discussed during prenatal care visits. You will also be monitored regularly for any new conditions that may  arise during your pregnancy so they can be treated quickly and effectively. WHAT HAPPENS DURING PRENATAL CARE VISITS? Prenatal care visits may include the following: Discussion Tell your health care provider about any new signs or symptoms you have experienced since your last visit. These might include:  Nausea or vomiting.  Increased or decreased level of energy.  Difficulty sleeping.  Back or leg pain.  Weight changes.  Frequent urination.  Shortness of breath with physical activity.  Changes in your skin, such as the development of a rash or itchiness.  Vaginal discharge or bleeding.  Feelings of excitement or nervousness.  Changes in your baby's movements. You may want to write down any questions or topics you want to discuss with your health care provider and bring them with you to your appointment. Examination During your first prenatal care visit, you will likely have a complete physical exam. Your health care provider will often examine your vagina, cervix, and the position of your uterus, as well as check your heart, lungs, and other body systems. As your pregnancy progresses, your health care provider will measure the size of your uterus and your baby's position inside your uterus. He or she may also examine you for early signs of labor. Your prenatal visits may also include checking your blood pressure and, after about 10-12 weeks of pregnancy, listening to your baby's heartbeat. Testing Regular testing often includes:  Urinalysis. This checks your urine for glucose, protein, or signs of infection.  Blood count. This checks the levels of white and red blood cells in your body.  Tests for sexually transmitted infections (STIs). Testing for STIs at the beginning of pregnancy is routinely done and is required in many states.  Antibody testing. You will be checked to see if you are immune to certain illnesses, such as rubella, that can affect a developing  fetus.  Glucose screen. Around 24-28 weeks of pregnancy, your blood glucose level will be checked for signs of gestational diabetes. Follow-up tests may be recommended.  Group B strep. This is a bacteria that is commonly found inside a woman's vagina. This test will inform your health care provider if you need an antibiotic to reduce the amount of this bacteria in your body prior to labor and childbirth.  Ultrasound. Many pregnant women undergo an ultrasound screening around 18-20 weeks of pregnancy to evaluate the health of the fetus and check for any developmental abnormalities.  HIV (human immunodeficiency virus) testing. Early in your pregnancy, you will be screened for HIV. If you are at high risk for HIV, this test may be repeated during your third trimester of pregnancy. You may be offered other testing based on your age, personal or family medical history, or other factors.  HOW OFTEN SHOULD I PLAN TO SEE MY HEALTH CARE PROVIDER FOR PRENATAL CARE? Your prenatal care check-up schedule depends on any medical conditions you have before, or develop during, your pregnancy. If you do not have any underlying medical conditions, you will likely be seen for checkups:  Monthly, during the first 6 months of pregnancy.  Twice a month during months 7 and 8 of pregnancy.  Weekly starting in the 9th month  of pregnancy and until delivery. If you develop signs of early labor or other concerning signs or symptoms, you may need to see your health care provider more often. Ask your health care provider what prenatal care schedule is best for you. WHAT CAN I DO TO KEEP MYSELF AND MY BABY AS HEALTHY AS POSSIBLE DURING MY PREGNANCY?  Take a prenatal vitamin containing 400 micrograms (0.4 mg) of folic acid every day. Your health care provider may also ask you to take additional vitamins such as iodine, vitamin D, iron, copper, and zinc.  Take 1500-2000 mg of calcium daily starting at your 20th week of  pregnancy until you deliver your baby.  Make sure you are up to date on your vaccinations. Unless directed otherwise by your health care provider:  You should receive a tetanus, diphtheria, and pertussis (Tdap) vaccination between the 27th and 36th week of your pregnancy, regardless of when your last Tdap immunization occurred. This helps protect your baby from whooping cough (pertussis) after he or she is born.  You should receive an annual inactivated influenza vaccine (IIV) to help protect you and your baby from influenza. This can be done at any point during your pregnancy.  Eat a well-rounded diet that includes:  Fresh fruits and vegetables.  Lean proteins.  Calcium-rich foods such as milk, yogurt, hard cheeses, and dark, leafy greens.  Whole grain breads.  Do noteat seafood high in mercury, including:  Swordfish.  Tilefish.  Shark.  King mackerel.  More than 6 oz tuna per week.  Do not eat:  Raw or undercooked meats or eggs.  Unpasteurized foods, such as soft cheeses (brie, blue, or feta), juices, and milks.  Lunch meats.  Hot dogs that have not been heated until they are steaming.  Drink enough water to keep your urine clear or pale yellow. For many women, this may be 10 or more 8 oz glasses of water each day. Keeping yourself hydrated helps deliver nutrients to your baby and may prevent the start of pre-term uterine contractions.  Do not use any tobacco products including cigarettes, chewing tobacco, or electronic cigarettes. If you need help quitting, ask your health care provider.  Do not drink beverages containing alcohol. No safe level of alcohol consumption during pregnancy has been determined.  Do not use any illegal drugs. These can harm your developing baby or cause a miscarriage.  Ask your health care provider or pharmacist before taking any prescription or over-the-counter medicines, herbs, or supplements.  Limit your caffeine intake to no more  than 200 mg per day.  Exercise. Unless told otherwise by your health care provider, try to get 30 minutes of moderate exercise most days of the week. Do not  do high-impact activities, contact sports, or activities with a high risk of falling, such as horseback riding or downhill skiing.  Get plenty of rest.  Avoid anything that raises your body temperature, such as hot tubs and saunas.  If you own a cat, do not empty its litter box. Bacteria contained in cat feces can cause an infection called toxoplasmosis. This can result in serious harm to the fetus.  Stay away from chemicals such as insecticides, lead, mercury, and cleaning or paint products that contain solvents.  Do not have any X-rays taken unless medically necessary.  Take a childbirth and breastfeeding preparation class. Ask your health care provider if you need a referral or recommendation.   This information is not intended to replace advice given to you by your  health care provider. Make sure you discuss any questions you have with your health care provider.   Document Released: 11/25/2003 Document Revised: 12/13/2014 Document Reviewed: 02/06/2014 Elsevier Interactive Patient Education [employer]

## 2016-03-31 NOTE — ED Notes (Signed)
Pt reports that she woke up this am with sore throat, pt reports she may be pregnant also.

## 2016-03-31 NOTE — ED Notes (Signed)
States sore throat since this am. Afebrile on arrival

## 2016-07-24 ENCOUNTER — Observation Stay
Admission: EM | Admit: 2016-07-24 | Discharge: 2016-07-24 | Disposition: A | Discharge status: Home / Self Care | Payer: Medicaid Other | Attending: Obstetrics and Gynecology | Admitting: Obstetrics and Gynecology

## 2016-07-24 ENCOUNTER — Encounter: Payer: Self-pay | Admitting: *Deleted

## 2016-07-24 DIAGNOSIS — K9289 Other specified diseases of the digestive system: Secondary | ICD-10-CM | POA: Insufficient documentation

## 2016-07-24 DIAGNOSIS — K529 Noninfective gastroenteritis and colitis, unspecified: Secondary | ICD-10-CM | POA: Diagnosis not present

## 2016-07-24 DIAGNOSIS — O368190 Decreased fetal movements, unspecified trimester, not applicable or unspecified: Principal | ICD-10-CM | POA: Diagnosis present

## 2016-07-24 DIAGNOSIS — O99619 Diseases of the digestive system complicating pregnancy, unspecified trimester: Secondary | ICD-10-CM | POA: Diagnosis not present

## 2016-07-24 LAB — URINALYSIS COMPLETE WITH MICROSCOPIC (ARMC ONLY)
BACTERIA UA: NONE SEEN
Bilirubin Urine: NEGATIVE
GLUCOSE, UA: NEGATIVE mg/dL
Ketones, ur: NEGATIVE mg/dL
Leukocytes, UA: NEGATIVE
Nitrite: NEGATIVE
PROTEIN: 100 mg/dL — AB
Specific Gravity, Urine: 1.025 (ref 1.005–1.030)
pH: 6 (ref 5.0–8.0)

## 2016-07-24 MED ORDER — ONDANSETRON 4 MG PO TBDP
4.0000 mg | ORAL_TABLET | Freq: Four times a day (QID) | ORAL | Status: DC | PRN
  Administered 2016-07-24: 4 mg via ORAL
  Filled 2016-07-24 (×2): qty 1

## 2016-07-24 MED ORDER — ONDANSETRON 4 MG PO TBDP: 4.0000 mg | ORAL_TABLET | Freq: Four times a day (QID) | ORAL | 0 refills | Status: DC | PRN

## 2016-07-24 MED ORDER — ACETAMINOPHEN 325 MG PO TABS: 650.0000 mg | ORAL_TABLET | ORAL | Status: DC | PRN

## 2016-07-24 NOTE — OB Triage Note (Signed)
Pt reports having diarrhea onset yesterday today continues, very watery. Reports that she has nausea early today, vomiting began at midnight. Unable to keep water down presently. Reports upper gi pain- feels tightness and crampy- reports no lower abd crampy

## 2016-07-24 NOTE — Final Progress Note (Signed)
Physician Final Progress Note  Patient ID: Betty Gomez MRN: [id number] DOB/AGE: 01/01/1986 30 y.o.  Admit date: 07/24/2016 Admitting provider: Vena AustriaAndreas Alyia Lacerte, MD Discharge date: 07/24/2016   Admission Diagnoses: Decreased fetal movement, nausea and emesis  Discharge Diagnoses:  Active Problems:   Decreased fetal movement Viral gastroenteritis  Consults: None  Significant Findings/ Diagnostic Studies: Results for orders placed or performed during the hospital encounter of 07/24/16 (from the past 24 hour(s))  Urinalysis complete, with microscopic ([facility] only)     Status: Abnormal   Collection Time: 07/24/16  9:51 PM  Result Value Ref Range   Color, Urine AMBER (A) YELLOW   APPearance CLEAR (A) CLEAR   Glucose, UA NEGATIVE NEGATIVE mg/dL   Bilirubin Urine NEGATIVE NEGATIVE   Ketones, ur NEGATIVE NEGATIVE mg/dL   Specific Gravity, Urine 1.025 1.005 - 1.030   Hgb urine dipstick 2+ (A) NEGATIVE   pH 6.0 5.0 - 8.0   Protein, ur 100 (A) NEGATIVE mg/dL   Nitrite NEGATIVE NEGATIVE   Leukocytes, UA NEGATIVE NEGATIVE   RBC / HPF 6-30 0 - 5 RBC/hpf   WBC, UA 0-5 0 - 5 WBC/hpf   Bacteria, UA NONE SEEN NONE SEEN   Squamous Epithelial / LPF 0-5 (A) NONE SEEN     Procedures: NST reactive 130, moderate variability, +accels no decels.  No contractions on tocometer   Discharge Condition: good  Disposition: 01-Home or Self Care  Diet: Regular diet  Discharge Activity: Activity as tolerated  Discharge Instructions    Discharge diet:    Complete by:  As directed   Light food such as toast, bread, rice, pasta, bananas , yellow, broth until you feel better   No sexual activity restrictions    Complete by:  As directed   Notify physician for a general feeling that "something is not right"    Complete by:  As directed   Notify physician for increase or change in vaginal discharge    Complete by:  As directed   Notify physician for intestinal cramps, with or without diarrhea,  sometimes described as "gas pain"    Complete by:  As directed   Notify physician for leaking of fluid    Complete by:  As directed   Notify physician for low, dull backache, unrelieved by heat or Tylenol    Complete by:  As directed   Notify physician for menstrual like cramps    Complete by:  As directed   Notify physician for pelvic pressure    Complete by:  As directed   Notify physician for uterine contractions.  These may be painless and feel like the uterus is tightening or the baby is  "balling up"    Complete by:  As directed   Notify physician for vaginal bleeding    Complete by:  As directed   PRETERM LABOR:  Includes any of the follwing symptoms that occur between 20 - [redacted] weeks gestation.  If these symptoms are not stopped, preterm labor can result in preterm delivery, placing your baby at risk    Complete by:  As directed       Medication List    STOP taking these medications   amoxicillin 500 MG tablet Commonly known as:  AMOXIL   lidocaine 2 % solution Commonly known as:  XYLOCAINE     TAKE these medications   multivitamin-prenatal 27-0.8 MG Tabs tablet Take 1 tablet by mouth daily at 12 noon.   ondansetron 4 MG disintegrating tablet Commonly known as:  ZOFRAN-ODT Take 1 tablet (4 mg total) by mouth every 6 (six) hours as needed for nausea or vomiting.        Total time spent taking care of this patient: 30 minutes  Signed: Lorrene ReidSTAEBLER, Daxson Reffett M 07/24/2016, 11:38 PM

## 2016-07-24 NOTE — Discharge Instructions (Signed)
Drink plenty of fluid and get plenty of rest, call your provider for any other concerns °

## 2016-07-25 DIAGNOSIS — O368190 Decreased fetal movements, unspecified trimester, not applicable or unspecified: Secondary | ICD-10-CM | POA: Diagnosis not present

## 2016-09-08 ENCOUNTER — Observation Stay
Admission: EM | Admit: 2016-09-08 | Discharge: 2016-09-08 | Disposition: A | Discharge status: Home / Self Care | Payer: Medicaid Other | Attending: Obstetrics and Gynecology | Admitting: Obstetrics and Gynecology

## 2016-09-08 DIAGNOSIS — O26893 Other specified pregnancy related conditions, third trimester: Secondary | ICD-10-CM | POA: Diagnosis present

## 2016-09-08 DIAGNOSIS — Z79899 Other long term (current) drug therapy: Secondary | ICD-10-CM | POA: Insufficient documentation

## 2016-09-08 DIAGNOSIS — J45909 Unspecified asthma, uncomplicated: Secondary | ICD-10-CM | POA: Diagnosis not present

## 2016-09-08 DIAGNOSIS — F17210 Nicotine dependence, cigarettes, uncomplicated: Secondary | ICD-10-CM | POA: Diagnosis not present

## 2016-09-08 DIAGNOSIS — Z3A37 37 weeks gestation of pregnancy: Secondary | ICD-10-CM | POA: Diagnosis not present

## 2016-09-08 DIAGNOSIS — O99213 Obesity complicating pregnancy, third trimester: Secondary | ICD-10-CM | POA: Insufficient documentation

## 2016-09-08 DIAGNOSIS — F329 Major depressive disorder, single episode, unspecified: Secondary | ICD-10-CM | POA: Diagnosis not present

## 2016-09-08 NOTE — OB Triage Note (Signed)
Pt was sent over from office for monitoring due to some decelerations in office. Pt states that she is having some back pain as well. Checked in office 2/75/-4. Denies any bleeding or leakage of fluid.

## 2016-09-08 NOTE — Final Progress Note (Signed)
Physician Final Progress Note  Patient ID: Betty Gomez MRN: [id number] DOB/AGE: [birth year] 30 y.o.  Admit date: 09/08/2016 Admitting provider: Conard NovakStephen D Jamyiah Labella, MD Discharge date: 09/08/2016   Admission Diagnoses: category 2 fetal heart rate tracing on NST in office  Discharge Diagnoses:  Reactive, category 1 fetal heart rate tracing for 2.5 hours.  History of Present Illness: The patient is a 30 y.o. female G4P3003 at 5937 weeks gestational age presents after being sent from office for a deceleration to the 90's (of uncertain character, variable, etc).  She notes +FM, no LOF, no VB. She notes occasional ctx.  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   Past Medical History:  Diagnosis Date  . Anxiety   . Asthma   . Depression    ok, came off meds, has someone she sees for this.  . Endometriosis   . Infection    UTI    Past Surgical History:  Procedure Laterality Date  . DIAGNOSTIC LAPAROSCOPY     endometriosis  . TUBAL LIGATION N/A 09/17/2015   Procedure: POST PARTUM TUBAL LIGATION;  Surgeon: Vena AustriaAndreas Staebler, MD;  Location: [facility] ORS;  Service: Gynecology;  Laterality: N/A;    No current facility-administered medications on file prior to encounter.    Current Outpatient Prescriptions on File Prior to Encounter  Medication Sig Dispense Refill  . Prenatal Vit-Fe Fumarate-FA (MULTIVITAMIN-PRENATAL) 27-0.8 MG TABS tablet Take 1 tablet by mouth daily at 12 noon.    . ondansetron (ZOFRAN-ODT) 4 MG disintegrating tablet Take 1 tablet (4 mg total) by mouth every 6 (six) hours as needed for nausea or vomiting. (Patient not taking: Reported on 09/08/2016) 20 tablet 0    No Known Allergies  Social History   Social History  . Marital status: Single    Spouse name: N/A  . Number of children: N/A  . Years of education: N/A   Occupational History  . Not on file.   Social History Main Topics  . Smoking status: Light Tobacco Smoker    Packs/day: 0.25   Years: 10.00    Types: Cigarettes  . Smokeless tobacco: Never Used  . Alcohol use No  . Drug use:     Types: Marijuana  . Sexual activity: Yes   Other Topics Concern  . Not on file   Social History Narrative  . No narrative on file    Physical Exam: Temp 98.2 F (36.8 C) (Oral)   Resp 20   Ht 5\' 6"  (1.676 m)   Wt 176 lb (79.8 kg)   LMP 12/31/2015 (Approximate)   BMI 28.41 kg/m   Gen: NAD CV: RRR Pulm: CTAB Pelvic: 2-3/50-4 Ext: no e/c/t  Consults: None  Significant Findings/ Diagnostic Studies: n/a  Procedures: NST Baseline: 130 Variability: moderate Accelerations: present Decelerations: absent Tocometry: occasional contractions, irregular   Discharge Condition: stable  Disposition: 01-Home or Self Care  Diet: Regular diet  Discharge Activity: Activity as tolerated   Total time spent taking care of this patient: 30 minutes  Signed: Conard NovakJackson, Ayelen Sciortino D, MD  09/08/2016, 1:58 PM

## 2016-09-20 ENCOUNTER — Inpatient Hospital Stay
Admission: EM | Admit: 2016-09-20 | Discharge: 2016-09-22 | DRG: 767 | Disposition: A | Discharge status: Home / Self Care | Payer: Medicaid Other | Attending: Obstetrics & Gynecology | Admitting: Obstetrics & Gynecology

## 2016-09-20 DIAGNOSIS — Z87891 Personal history of nicotine dependence: Secondary | ICD-10-CM

## 2016-09-20 DIAGNOSIS — J45909 Unspecified asthma, uncomplicated: Secondary | ICD-10-CM | POA: Diagnosis present

## 2016-09-20 DIAGNOSIS — Z302 Encounter for sterilization: Secondary | ICD-10-CM | POA: Diagnosis not present

## 2016-09-20 DIAGNOSIS — Z3483 Encounter for supervision of other normal pregnancy, third trimester: Secondary | ICD-10-CM | POA: Diagnosis present

## 2016-09-20 DIAGNOSIS — E669 Obesity, unspecified: Secondary | ICD-10-CM | POA: Diagnosis present

## 2016-09-20 DIAGNOSIS — Z6828 Body mass index (BMI) 28.0-28.9, adult: Secondary | ICD-10-CM | POA: Diagnosis not present

## 2016-09-20 DIAGNOSIS — Z3A39 39 weeks gestation of pregnancy: Secondary | ICD-10-CM | POA: Diagnosis not present

## 2016-09-20 DIAGNOSIS — O99214 Obesity complicating childbirth: Secondary | ICD-10-CM | POA: Diagnosis present

## 2016-09-20 DIAGNOSIS — D649 Anemia, unspecified: Secondary | ICD-10-CM | POA: Diagnosis present

## 2016-09-20 DIAGNOSIS — O9952 Diseases of the respiratory system complicating childbirth: Secondary | ICD-10-CM | POA: Diagnosis present

## 2016-09-20 DIAGNOSIS — O9902 Anemia complicating childbirth: Secondary | ICD-10-CM | POA: Diagnosis present

## 2016-09-20 LAB — CBC
HCT: 33.1 % — ABNORMAL LOW (ref 35.0–47.0)
Hemoglobin: 11.3 g/dL — ABNORMAL LOW (ref 12.0–16.0)
MCH: 30 pg (ref 26.0–34.0)
MCHC: 34 g/dL (ref 32.0–36.0)
MCV: 88.1 fL (ref 80.0–100.0)
PLATELETS: 208 10*3/uL (ref 150–440)
RBC: 3.76 MIL/uL — ABNORMAL LOW (ref 3.80–5.20)
RDW: 14 % (ref 11.5–14.5)
WBC: 11.5 10*3/uL — AB (ref 3.6–11.0)

## 2016-09-20 LAB — TYPE AND SCREEN
ABO/RH(D): O POS
ANTIBODY SCREEN: NEGATIVE

## 2016-09-20 MED ORDER — BENZOCAINE-MENTHOL 20-0.5 % EX AERO: 1.0000 "application " | INHALATION_SPRAY | CUTANEOUS | Status: DC | PRN

## 2016-09-20 MED ORDER — ONDANSETRON HCL 4 MG/2ML IJ SOLN: 4.0000 mg | INTRAMUSCULAR | Status: DC | PRN

## 2016-09-20 MED ORDER — LIDOCAINE HCL (PF) 1 % IJ SOLN
INTRAMUSCULAR | Status: AC
  Filled 2016-09-20: qty 30

## 2016-09-20 MED ORDER — SODIUM CHLORIDE 0.9% FLUSH
3.0000 mL | Freq: Two times a day (BID) | INTRAVENOUS | Status: DC
  Administered 2016-09-20: 3 mL via INTRAVENOUS

## 2016-09-20 MED ORDER — DIPHENHYDRAMINE HCL 25 MG PO CAPS: 25.0000 mg | ORAL_CAPSULE | Freq: Four times a day (QID) | ORAL | Status: DC | PRN

## 2016-09-20 MED ORDER — SENNOSIDES-DOCUSATE SODIUM 8.6-50 MG PO TABS: 2.0000 | ORAL_TABLET | ORAL | Status: DC

## 2016-09-20 MED ORDER — OXYTOCIN BOLUS FROM INFUSION
500.0000 mL | Freq: Once | INTRAVENOUS | Status: AC
  Administered 2016-09-20: 500 mL via INTRAVENOUS

## 2016-09-20 MED ORDER — LACTATED RINGERS IV SOLN: 500.0000 mL | INTRAVENOUS | Status: DC | PRN

## 2016-09-20 MED ORDER — AMMONIA AROMATIC IN INHA
RESPIRATORY_TRACT | Status: AC
  Filled 2016-09-20: qty 10

## 2016-09-20 MED ORDER — ZOLPIDEM TARTRATE 5 MG PO TABS: 5.0000 mg | ORAL_TABLET | Freq: Every evening | ORAL | Status: DC | PRN

## 2016-09-20 MED ORDER — COCONUT OIL OIL: 1.0000 "application " | TOPICAL_OIL | Status: DC | PRN

## 2016-09-20 MED ORDER — IBUPROFEN 600 MG PO TABS
600.0000 mg | ORAL_TABLET | Freq: Four times a day (QID) | ORAL | Status: DC
  Administered 2016-09-20 – 2016-09-22 (×9): 600 mg via ORAL
  Filled 2016-09-20 (×9): qty 1

## 2016-09-20 MED ORDER — AMPICILLIN SODIUM 1 G IJ SOLR
1.0000 g | INTRAMUSCULAR | Status: DC
  Filled 2016-09-20 (×5): qty 1000

## 2016-09-20 MED ORDER — SIMETHICONE 80 MG PO CHEW: 80.0000 mg | CHEWABLE_TABLET | ORAL | Status: DC | PRN

## 2016-09-20 MED ORDER — LACTATED RINGERS IV SOLN
INTRAVENOUS | Status: DC
  Administered 2016-09-21: 09:00:00 via INTRAVENOUS

## 2016-09-20 MED ORDER — WITCH HAZEL-GLYCERIN EX PADS: 1.0000 "application " | MEDICATED_PAD | CUTANEOUS | Status: DC | PRN

## 2016-09-20 MED ORDER — ACETAMINOPHEN 325 MG PO TABS: 650.0000 mg | ORAL_TABLET | ORAL | Status: DC | PRN

## 2016-09-20 MED ORDER — MISOPROSTOL 200 MCG PO TABS
ORAL_TABLET | ORAL | Status: AC
  Filled 2016-09-20: qty 4

## 2016-09-20 MED ORDER — BUTORPHANOL TARTRATE 1 MG/ML IJ SOLN
1.0000 mg | INTRAMUSCULAR | Status: DC | PRN
  Filled 2016-09-20: qty 1

## 2016-09-20 MED ORDER — OXYTOCIN 40 UNITS IN LACTATED RINGERS INFUSION - SIMPLE MED
INTRAVENOUS | Status: AC
  Administered 2016-09-20: 500 mL via INTRAVENOUS
  Filled 2016-09-20: qty 1000

## 2016-09-20 MED ORDER — ONDANSETRON HCL 4 MG/2ML IJ SOLN: 4.0000 mg | Freq: Four times a day (QID) | INTRAMUSCULAR | Status: DC | PRN

## 2016-09-20 MED ORDER — SODIUM CHLORIDE 0.9% FLUSH: 3.0000 mL | INTRAVENOUS | Status: DC | PRN

## 2016-09-20 MED ORDER — OXYTOCIN 10 UNIT/ML IJ SOLN
INTRAMUSCULAR | Status: AC
  Filled 2016-09-20: qty 2

## 2016-09-20 MED ORDER — ONDANSETRON HCL 4 MG PO TABS: 4.0000 mg | ORAL_TABLET | ORAL | Status: DC | PRN

## 2016-09-20 MED ORDER — AMPICILLIN SODIUM 2 G IJ SOLR
2.0000 g | Freq: Once | INTRAMUSCULAR | Status: DC
  Filled 2016-09-20: qty 2000

## 2016-09-20 MED ORDER — LACTATED RINGERS IV SOLN: INTRAVENOUS | Status: DC

## 2016-09-20 MED ORDER — OXYCODONE HCL 5 MG PO TABS
5.0000 mg | ORAL_TABLET | ORAL | Status: DC | PRN
  Administered 2016-09-20 – 2016-09-22 (×10): 5 mg via ORAL
  Filled 2016-09-20 (×10): qty 1

## 2016-09-20 MED ORDER — SENNOSIDES-DOCUSATE SODIUM 8.6-50 MG PO TABS
2.0000 | ORAL_TABLET | ORAL | Status: DC
  Administered 2016-09-21 – 2016-09-22 (×2): 2 via ORAL
  Filled 2016-09-20 (×2): qty 2

## 2016-09-20 MED ORDER — SODIUM CHLORIDE 0.9 % IV SOLN: 250.0000 mL | INTRAVENOUS | Status: DC | PRN

## 2016-09-20 MED ORDER — DIBUCAINE 1 % RE OINT: 1.0000 "application " | TOPICAL_OINTMENT | RECTAL | Status: DC | PRN

## 2016-09-20 MED ORDER — OXYTOCIN 40 UNITS IN LACTATED RINGERS INFUSION - SIMPLE MED: 2.5000 [IU]/h | INTRAVENOUS | Status: DC

## 2016-09-20 MED ORDER — ACETAMINOPHEN 325 MG PO TABS
650.0000 mg | ORAL_TABLET | ORAL | Status: DC | PRN
  Administered 2016-09-20 – 2016-09-22 (×2): 650 mg via ORAL
  Filled 2016-09-20 (×2): qty 2

## 2016-09-20 NOTE — H&P (Signed)
Obstetrics Admission History & Physical   Normal labor   HPI:  30 y.o. [id number] @ 1053w3d (09/24/2016, by Patient Reported). Admitted on 09/20/2016:   Patient Active Problem List   Diagnosis Date Noted  . Normal labor 09/20/2016  . Labor and delivery, indication for care 09/08/2016  . Decreased fetal movement 07/24/2016  . Pregnancy 09/15/2015  . Insufficient prenatal care 09/15/2015  . Indication for care in labor or delivery 09/10/2015  . Braxton Hicks contractions 09/08/2015  . Candida albicans infection 08/16/2015  . Abnormal antenatal AFP screen 05/24/2015  . Irregular contractions 05/24/2015     Presents for painful ctxs this am, no rom or vb; good FM. Preg compl by [facility], prior GBS sepsis infant, obesity, anemia, asthma, substance use. Desires pp BTL.  [facility]  PMHx:  Past Medical History:  Diagnosis Date  . Anxiety   . Asthma   . Depression    ok, came off meds, has someone she sees for this.  . Endometriosis   . Infection    UTI   PSHx:  Past Surgical History:  Procedure Laterality Date  . DIAGNOSTIC LAPAROSCOPY     endometriosis  . TUBAL LIGATION N/A 09/17/2015   Procedure: POST PARTUM TUBAL LIGATION;  Surgeon: Vena AustriaAndreas Staebler, MD;  Location: [facility] ORS;  Service: Gynecology;  Laterality: N/A;   Medications:  Prescriptions Prior to Admission  Medication Sig Dispense Refill Last Dose  . ferrous sulfate 325 (65 FE) MG EC tablet Take 325 mg by mouth 3 (three) times daily with meals.   09/19/2016 at Unknown time  . Prenatal Vit-Fe Fumarate-FA (MULTIVITAMIN-PRENATAL) 27-0.8 MG TABS tablet Take 1 tablet by mouth daily at 12 noon.   09/19/2016 at Unknown time   Allergies: has No Known Allergies. OBHx:  OB History  Gravida Para Term Preterm AB Living  4 3 3  0 0 3  SAB TAB Ectopic Multiple Live Births  0 0 0 0 3    # Outcome Date GA Lbr Len/2nd Weight Sex Delivery Anes PTL Lv  4 Current           3 Term 09/16/15 6211w0d / 00:31 7 lb 6.5 oz  (3.36 kg) M Vag-Spont EPI  LIV  2 Term     M  EPI N LIV  1 Term     M   N LIV    Obstetric Comments  Pt states last infant contracted GBS even though pt treated in labor. Pt states infant caught it anyway- treated successfully. IF positive for GBS at 36 weeks this pregnancy - pt says plan is for C/S. 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, grtowing well   JYN:WGNFAOZH/YQMVHQIONGEXFHx:Negative/unremarkable except as detailed in HPI. Soc Hx: Former smoker, Alcohol: none and Recreational drug use: former  Objective:   Vitals:   09/20/16 0552  BP: 126/73  Pulse: 85  Resp: 19  Temp: 97.6 F (36.4 C)   General: Well nourished, well developed female in no acute distress.  Skin: Warm and dry.  Cardiovascular:Regular rate and rhythm. Respiratory: Clear to auscultation bilateral. Normal respiratory effort Abdomen: moderate Neuro/Psych: Normal mood and affect.   Pelvic exam: is not limited by body habitus EGBUS: within normal limits Vagina: within normal limits and with normal mucosa blood in the vault Cervix: 6/90/0 Uterus: Spontaneous uterine activity  Adnexa: not evaluated  EFM:FHR: 140 bpm, variability: moderate,  accelerations:  Present,  decelerations:  Absent Toco: Frequency: Every 2-4 minutes   Perinatal info:  Blood type: O  positive Rubella- Immune Varicella -Immune TDaP Given during third trimester of this pregnancy RPR NR / HIV Neg/ HBsAg Neg  GBS NEG  Assessment & Plan:   30 y.o. [id number] @ [redacted]w[redacted]d, Admitted on 09/20/2016:ACTIVE LABOR   GBS neg but with history will give prophylactic ABX; unsure if will get full time due to rapid signs of labor  Admit for labor, Antibiotics for GBS prophylaxis and Fetal Wellbeing Reassuring

## 2016-09-20 NOTE — Discharge Summary (Signed)
Obstetrical Discharge Summary  Date of Admission: 09/20/2016 Date of Discharge: 09/22/16  Discharge Diagnosis: Term Pregnancy-delivered Primary OB:  [facility]   Gestational Age at Delivery: 3338w3d  Antepartum complications: Single Umbilical Artery, Anemia, Asthma, Obesity, GBS sepsis Date of Delivery: 09/22/2016  Delivered By: Annamarie MajorPaul Harris, MD Delivery Type: spontaneous vaginal delivery Intrapartum complications/course: Precipitous Delivery Anesthesia: none Placenta: spontaneous Laceration: none Episiotomy: none Live born female  Birth Weight: 7 lb 1.9 oz (3230 g) APGAR: 9, 9   Post partum course: Since the delivery, patient has tolerate activity, diet, and daily functions without difficulty or complication.  Min lochia.  No breast concerns at this time.  No signs of depression currently.   Postpartum Exam:General appearance: alert, appears stated age and no distress GI: Fundus non-tender Extremities: extremities normal, atraumatic, no cyanosis or edema  Disposition: home with infant Rh Immune globulin given: not applicable Rubella vaccine given: no Varicella vaccine given: no Tdap vaccine given in AP or PP setting: given during prenatal care Contraception: bilateral tubal ligation  Prenatal Labs: O POS//Rubella Immune//RPR negative//HIV negative/HepB Surface Ag negative//plans to breastfeed  Plan:  Betty Gomez Stretch was discharged to home in good condition. [facility]  Discharge Medications:   Medication List    TAKE these medications   ferrous sulfate 325 (65 FE) MG EC tablet Take 1 tablet (325 mg total) by mouth daily with breakfast. What changed:  when to take this   ibuprofen 600 MG tablet Commonly known as:  ADVIL,MOTRIN Take 1 tablet (600 mg total) by mouth every 6 (six) hours.   multivitamin-prenatal 27-0.8 MG Tabs tablet Take 1 tablet by mouth daily at 12 noon.   oxyCODONE 5 MG immediate release tablet Commonly known  as:  Oxy IR/ROXICODONE Take 1 tablet (5 mg total) by mouth every 4 (four) hours as needed for breakthrough pain.

## 2016-09-20 NOTE — Discharge Instructions (Signed)

## 2016-09-21 ENCOUNTER — Inpatient Hospital Stay: Payer: Medicaid Other | Admitting: Certified Registered Nurse Anesthetist

## 2016-09-21 ENCOUNTER — Encounter: Payer: Self-pay | Admitting: *Deleted

## 2016-09-21 ENCOUNTER — Encounter
Admission: EM | Disposition: A | Discharge status: Home / Self Care | Payer: Self-pay | Source: Home / Self Care | Attending: Obstetrics & Gynecology

## 2016-09-21 HISTORY — PX: TUBAL LIGATION: SHX77

## 2016-09-21 LAB — CBC
HEMATOCRIT: 30 % — AB (ref 35.0–47.0)
Hemoglobin: 10 g/dL — ABNORMAL LOW (ref 12.0–16.0)
MCH: 29.9 pg (ref 26.0–34.0)
MCHC: 33.2 g/dL (ref 32.0–36.0)
MCV: 89.9 fL (ref 80.0–100.0)
Platelets: 174 10*3/uL (ref 150–440)
RBC: 3.34 MIL/uL — ABNORMAL LOW (ref 3.80–5.20)
RDW: 14 % (ref 11.5–14.5)
WBC: 12 10*3/uL — ABNORMAL HIGH (ref 3.6–11.0)

## 2016-09-21 LAB — RPR: RPR: NONREACTIVE

## 2016-09-21 SURGERY — LIGATION, FALLOPIAN TUBE, POSTPARTUM
Anesthesia: General | Laterality: Bilateral

## 2016-09-21 MED ORDER — BUPIVACAINE HCL 0.5 % IJ SOLN
INTRAMUSCULAR | Status: DC | PRN
  Administered 2016-09-21: 10 mL

## 2016-09-21 MED ORDER — ACETAMINOPHEN 10 MG/ML IV SOLN
INTRAVENOUS | Status: AC
  Filled 2016-09-21: qty 100

## 2016-09-21 MED ORDER — LIDOCAINE HCL (CARDIAC) 20 MG/ML IV SOLN
INTRAVENOUS | Status: DC | PRN
  Administered 2016-09-21: 30 mg via INTRAVENOUS

## 2016-09-21 MED ORDER — ONDANSETRON HCL 4 MG/2ML IJ SOLN: 4.0000 mg | Freq: Once | INTRAMUSCULAR | Status: DC | PRN

## 2016-09-21 MED ORDER — PROPOFOL 10 MG/ML IV BOLUS
INTRAVENOUS | Status: DC | PRN
  Administered 2016-09-21: 180 mg via INTRAVENOUS
  Administered 2016-09-21: 20 mg via INTRAVENOUS

## 2016-09-21 MED ORDER — ONDANSETRON HCL 4 MG/2ML IJ SOLN
INTRAMUSCULAR | Status: DC | PRN
  Administered 2016-09-21: 4 mg via INTRAVENOUS

## 2016-09-21 MED ORDER — FENTANYL CITRATE (PF) 100 MCG/2ML IJ SOLN
INTRAMUSCULAR | Status: AC
  Administered 2016-09-21: 25 ug via INTRAVENOUS
  Filled 2016-09-21: qty 2

## 2016-09-21 MED ORDER — MIDAZOLAM HCL 2 MG/2ML IJ SOLN
INTRAMUSCULAR | Status: DC | PRN
  Administered 2016-09-21: 2 mg via INTRAVENOUS

## 2016-09-21 MED ORDER — FENTANYL CITRATE (PF) 100 MCG/2ML IJ SOLN
INTRAMUSCULAR | Status: DC | PRN
  Administered 2016-09-21 (×2): 25 ug via INTRAVENOUS
  Administered 2016-09-21: 50 ug via INTRAVENOUS

## 2016-09-21 MED ORDER — BUPIVACAINE HCL (PF) 0.5 % IJ SOLN
INTRAMUSCULAR | Status: AC
  Filled 2016-09-21: qty 30

## 2016-09-21 MED ORDER — ACETAMINOPHEN 10 MG/ML IV SOLN
INTRAVENOUS | Status: DC | PRN
  Administered 2016-09-21: 1000 mg via INTRAVENOUS

## 2016-09-21 MED ORDER — SUCCINYLCHOLINE CHLORIDE 20 MG/ML IJ SOLN
INTRAMUSCULAR | Status: DC | PRN
  Administered 2016-09-21: 140 mg via INTRAVENOUS

## 2016-09-21 MED ORDER — FENTANYL CITRATE (PF) 100 MCG/2ML IJ SOLN
25.0000 ug | INTRAMUSCULAR | Status: AC | PRN
  Administered 2016-09-21 (×6): 25 ug via INTRAVENOUS

## 2016-09-21 MED ORDER — DEXAMETHASONE SODIUM PHOSPHATE 10 MG/ML IJ SOLN
INTRAMUSCULAR | Status: DC | PRN
  Administered 2016-09-21: 10 mg via INTRAVENOUS

## 2016-09-21 MED ORDER — KETOROLAC TROMETHAMINE 30 MG/ML IJ SOLN
INTRAMUSCULAR | Status: DC | PRN
  Administered 2016-09-21: 30 mg via INTRAVENOUS

## 2016-09-21 SURGICAL SUPPLY — 23 items
CHLORAPREP W/TINT 26ML (MISCELLANEOUS) ×3 IMPLANT
DERMABOND ADVANCED (GAUZE/BANDAGES/DRESSINGS) ×2
DERMABOND ADVANCED .7 DNX12 (GAUZE/BANDAGES/DRESSINGS) ×1 IMPLANT
DRAPE LAPAROTOMY T 102X78X121 (DRAPES) ×3 IMPLANT
DRESSING TELFA 4X3 1S ST N-ADH (GAUZE/BANDAGES/DRESSINGS) IMPLANT
DRSG TEGADERM 2-3/8X2-3/4 SM (GAUZE/BANDAGES/DRESSINGS) IMPLANT
ELECT CAUTERY BLADE 6.4 (BLADE) ×3 IMPLANT
ELECT REM PT RETURN 9FT ADLT (ELECTROSURGICAL) ×3
ELECTRODE REM PT RTRN 9FT ADLT (ELECTROSURGICAL) ×1 IMPLANT
GLOVE BIO SURGEON STRL SZ8 (GLOVE) ×3 IMPLANT
GOWN STRL REUS W/ TWL LRG LVL3 (GOWN DISPOSABLE) ×1 IMPLANT
GOWN STRL REUS W/ TWL XL LVL3 (GOWN DISPOSABLE) ×1 IMPLANT
GOWN STRL REUS W/TWL LRG LVL3 (GOWN DISPOSABLE) ×2
GOWN STRL REUS W/TWL XL LVL3 (GOWN DISPOSABLE) ×2
LABEL OR SOLS (LABEL) ×3 IMPLANT
NDL SAFETY 22GX1.5 (NEEDLE) ×3 IMPLANT
NS IRRIG 500ML POUR BTL (IV SOLUTION) ×3 IMPLANT
PACK BASIN MINOR ARMC (MISCELLANEOUS) ×3 IMPLANT
STRAP SAFETY BODY (MISCELLANEOUS) ×3 IMPLANT
SUT VIC AB 0 SH 27 (SUTURE) ×3 IMPLANT
SUT VIC AB 2-0 UR6 27 (SUTURE) ×3 IMPLANT
SUT VIC AB 4-0 PS2 18 (SUTURE) IMPLANT
SYRINGE 10CC LL (SYRINGE) ×3 IMPLANT

## 2016-09-21 NOTE — Anesthesia Procedure Notes (Signed)
Procedure Name: Intubation Date/Time: 09/21/2016 11:20 AM Performed by: Ginger CarneMICHELET, Albeiro Trompeter Pre-anesthesia Checklist: Patient identified, Emergency Drugs available, Suction available, Patient being monitored and Timeout performed Patient Re-evaluated:Patient Re-evaluated prior to inductionOxygen Delivery Method: Circle system utilized Preoxygenation: Pre-oxygenation with 100% oxygen Intubation Type: IV induction and Rapid sequence Laryngoscope Size: Miller and 2 Grade View: Grade I Tube type: Oral Tube size: 7.0 mm Number of attempts: 1 Airway Equipment and Method: Stylet Placement Confirmation: ETT inserted through vocal cords under direct vision,  positive ETCO2 and breath sounds checked- equal and bilateral Secured at: 20 cm Tube secured with: Tape Dental Injury: Teeth and Oropharynx as per pre-operative assessment

## 2016-09-21 NOTE — Op Note (Signed)
Operative Note  09/21/2016  PRE-OP DIAGNOSIS: Desire for sterility  POST-OP DIAGNOSIS: same  SURGEON: Annamarie MajorPaul Kaedynce Tapp, MD, FACOG  PROCEDURE: Postpartum Bilateral Tubal Ligation Procedure   ANESTHESIA: Choice   ESTIMATED BLOOD LOSS: <50 mL  SPECIMENS: Portion of left and right tube  COMPLICATIONS: None  DISPOSITION: PACU - hemodynamically stable.  CONDITION: stable  FINDINGS: Exam under anesthesia revealed small, mobile  uterus with no masses and bilateral adnexa without masses or fullness.   TECHNIQUE:  Patient is prepped and draped in usual sterile fashion after adequate anesthesia is obtained in the supine position on the operating room table.  Local anesthesia is injected into the skin just inferior to the umbilicus, followed by a small vertical incision with a scapel.  Fascia is identified and tented upwards, and an incision is made with Mayo scissors.  Identification of no adherent bowel is made. Retractors are placed and trendelenburg positioning is achieved.    The left Fallopian tube was identified, grasped with the Babcock clamps, lifted to the skin incision and followed out distally to the fimbriae. An avascular midsection of the tube approximately 3-4cm from the cornua was grasped with the babcock clamps and brought into a knuckle at the skin incision. The tube was double ligated with 2-0 Vicryl suture and the intervening portion of tube was transected and removed. Excellent hemostasis was noted and the tube was returned to the abdomen. Attention was then turned to the right fallopian tube after confirmation of identification by tracing the tube out to the fimbriae. The same procedure was then performed on the right Fallopian tube. Again, excellent hemostasis was noted at the end of the procedure.  Retractors are removed and fascia closed with a 2-0 Vicryl suture. Irrigation and hemostasis confirmed.  Skin closed with a 4-0 vicryl suture in a subcuticular fashion followed by  skin adhesive.  Pt goes to recovery room in stable fashion.  All counts correct times 2.

## 2016-09-21 NOTE — Anesthesia Preprocedure Evaluation (Signed)
Anesthesia Evaluation  Patient identified by MRN, date of birth, ID band Patient awake    Reviewed: Allergy & Precautions, H&P , NPO status , Patient's Chart, lab work & pertinent test results  Airway Mallampati: III  TM Distance: >3 FB Neck ROM: full    Dental no notable dental hx. (+) Teeth Intact   Pulmonary neg pulmonary ROS, asthma , Current Smoker,    Pulmonary exam normal breath sounds clear to auscultation       Cardiovascular Exercise Tolerance: Good negative cardio ROS Normal cardiovascular exam Rhythm:regular Rate:Normal     Neuro/Psych PSYCHIATRIC DISORDERS Anxiety Depression negative neurological ROS  negative psych ROS   GI/Hepatic negative GI ROS, Neg liver ROS,   Endo/Other  negative endocrine ROS  Renal/GU negative Renal ROS  negative genitourinary   Musculoskeletal negative musculoskeletal ROS (+)   Abdominal   Peds negative pediatric ROS (+)  Hematology negative hematology ROS (+)   Anesthesia Other Findings   Reproductive/Obstetrics                             Anesthesia Physical  Anesthesia Plan  ASA: II  Anesthesia Plan: General   Post-op Pain Management:    Induction: Intravenous, Rapid sequence and Cricoid pressure planned  Airway Management Planned: Oral ETT  Additional Equipment:   Intra-op Plan: Utilization Of Total Body Hypothermia per surgeon request  Post-operative Plan: Extubation in OR  Informed Consent: I have reviewed the patients History and Physical, chart, labs and discussed the procedure including the risks, benefits and alternatives for the proposed anesthesia with the patient or authorized representative who has indicated his/her understanding and acceptance.   Dental advisory given  Plan Discussed with: CRNA and Surgeon  Anesthesia Plan Comments:         Anesthesia Quick Evaluation

## 2016-09-21 NOTE — Progress Notes (Signed)
Admit Date: 09/20/2016 Today's Date: 09/21/2016  Post Partum Day 1  Subjective:  no complaints, up ad lib, voiding and tolerating PO  Objective: Temp:  [97.7 F (36.5 C)-98.1 F (36.7 C)] 98 F (36.7 C) (10/17 0330) Pulse Rate:  [62-78] 78 (10/17 0330) Resp:  [18-20] 20 (10/17 0330) BP: (105-120)/(60-65) 105/60 (10/17 0330) SpO2:  [97 %-100 %] 100 % (10/17 0330)  Physical Exam:  General: alert, cooperative and no distress Lochia: appropriate Uterine Fundus: firm Incision: none DVT Evaluation: No evidence of DVT seen on physical exam.   Recent Labs  09/20/16 0556 09/21/16 0532  HGB 11.3* 10.0*  HCT 33.1* 30.0*    Assessment/Plan: Plan for discharge tomorrow, Breastfeeding and Infant doing well  PP BTL today, pros and cons and failure rates and permanancy discussed.   LOS: 1 day   Betty Gomez [facility] 09/21/2016, 7:31 AM

## 2016-09-21 NOTE — Transfer of Care (Signed)
Immediate Anesthesia Transfer of Care Note  Patient: Betty Gomez  Procedure(s) Performed: Procedure(s): POST PARTUM TUBAL LIGATION (Bilateral)  Patient Location: PACU  Anesthesia Type:General  Level of Consciousness: sedated  Airway & Oxygen Therapy: Patient Spontanous Breathing and Patient connected to face mask oxygen  Post-op Assessment: Report given to RN and Post -op Vital signs reviewed and stable  Post vital signs: Reviewed and stable  Last Vitals:  Vitals:   09/21/16 0902 09/21/16 1018  BP: 116/74 129/71  Pulse: 71 (!) 55  Resp:  18  Temp: 36.9 C 36.6 C    Last Pain:  Vitals:   09/21/16 1018  TempSrc: Oral  PainSc: 3          Complications: No apparent anesthesia complications

## 2016-09-22 LAB — SURGICAL PATHOLOGY

## 2016-09-22 MED ORDER — FERROUS SULFATE 325 (65 FE) MG PO TBEC: 325.0000 mg | DELAYED_RELEASE_TABLET | Freq: Every day | ORAL | 3 refills | Status: DC

## 2016-09-22 MED ORDER — OXYCODONE HCL 5 MG PO TABS: 5.0000 mg | ORAL_TABLET | ORAL | 0 refills | Status: DC | PRN

## 2016-09-22 MED ORDER — IBUPROFEN 600 MG PO TABS: 600.0000 mg | ORAL_TABLET | Freq: Four times a day (QID) | ORAL | 0 refills | Status: DC

## 2016-09-22 NOTE — Progress Notes (Signed)
Pt discharged home with infant.  Discharge instructions and follow up appointment given to and reviewed with pt.  Pt verbalized understanding.  Escorted by auxillary. 

## 2016-09-22 NOTE — Anesthesia Postprocedure Evaluation (Signed)
Anesthesia Post Note  Patient: Betty Gomez  Procedure(s) Performed: Procedure(s) (LRB): POST PARTUM TUBAL LIGATION (Bilateral)  Patient location during evaluation: PACU Anesthesia Type: General Level of consciousness: awake and alert and oriented Pain management: pain level controlled Vital Signs Assessment: post-procedure vital signs reviewed and stable Respiratory status: spontaneous breathing Cardiovascular status: blood pressure returned to baseline Anesthetic complications: no    Last Vitals:  Vitals:   09/22/16 0305 09/22/16 0739  BP:  122/74  Pulse:  70  Resp:  18  Temp: 36.9 C 36.6 C    Last Pain:  Vitals:   09/22/16 1055  TempSrc:   PainSc: 5                  Jacquelina Hewins

## 2016-10-01 ENCOUNTER — Emergency Department
Admission: EM | Admit: 2016-10-01 | Discharge: 2016-10-02 | Disposition: A | Discharge status: Home / Self Care | Payer: Medicaid Other | Attending: Emergency Medicine | Admitting: Emergency Medicine

## 2016-10-01 DIAGNOSIS — T814XXA Infection following a procedure, initial encounter: Secondary | ICD-10-CM | POA: Insufficient documentation

## 2016-10-01 DIAGNOSIS — R1033 Periumbilical pain: Secondary | ICD-10-CM | POA: Diagnosis not present

## 2016-10-01 DIAGNOSIS — G8918 Other acute postprocedural pain: Secondary | ICD-10-CM | POA: Diagnosis present

## 2016-10-01 DIAGNOSIS — Z791 Long term (current) use of non-steroidal anti-inflammatories (NSAID): Secondary | ICD-10-CM | POA: Diagnosis not present

## 2016-10-01 DIAGNOSIS — J45909 Unspecified asthma, uncomplicated: Secondary | ICD-10-CM | POA: Diagnosis not present

## 2016-10-01 DIAGNOSIS — T8140XA Infection following a procedure, unspecified, initial encounter: Secondary | ICD-10-CM

## 2016-10-01 DIAGNOSIS — F17210 Nicotine dependence, cigarettes, uncomplicated: Secondary | ICD-10-CM | POA: Diagnosis not present

## 2016-10-01 DIAGNOSIS — Y69 Unspecified misadventure during surgical and medical care: Secondary | ICD-10-CM | POA: Insufficient documentation

## 2016-10-01 NOTE — ED Triage Notes (Signed)
Pt presents to ED post tubal ligation on 10/17. Pt states she has been experiencing ongoing burning and pain to incision site. This evening pt noticed greenish/brown drainage "pouring" from incision that has a foul odor. Pt reports having to change her dressing every 5 minutes. Pain has increased and slight swelling noted.

## 2016-10-02 ENCOUNTER — Encounter: Payer: Self-pay | Admitting: Emergency Medicine

## 2016-10-02 ENCOUNTER — Emergency Department: Payer: Medicaid Other

## 2016-10-02 LAB — CBC
HCT: 37.6 % (ref 35.0–47.0)
Hemoglobin: 12.4 g/dL (ref 12.0–16.0)
MCH: 29.6 pg (ref 26.0–34.0)
MCHC: 33 g/dL (ref 32.0–36.0)
MCV: 89.6 fL (ref 80.0–100.0)
PLATELETS: 231 10*3/uL (ref 150–440)
RBC: 4.2 MIL/uL (ref 3.80–5.20)
RDW: 14.1 % (ref 11.5–14.5)
WBC: 11.7 10*3/uL — AB (ref 3.6–11.0)

## 2016-10-02 LAB — COMPREHENSIVE METABOLIC PANEL
ALBUMIN: 3.4 g/dL — AB (ref 3.5–5.0)
ALK PHOS: 99 U/L (ref 38–126)
ALT: 19 U/L (ref 14–54)
AST: 20 U/L (ref 15–41)
Anion gap: 9 (ref 5–15)
BUN: 15 mg/dL (ref 6–20)
CALCIUM: 8.8 mg/dL — AB (ref 8.9–10.3)
CO2: 22 mmol/L (ref 22–32)
Chloride: 108 mmol/L (ref 101–111)
Creatinine, Ser: 0.78 mg/dL (ref 0.44–1.00)
GFR calc Af Amer: >60 mL/min (ref >60)
GFR calc non Af Amer: >60 mL/min (ref >60)
GLUCOSE: 102 mg/dL — AB (ref 65–99)
Potassium: 3.9 mmol/L (ref 3.5–5.1)
SODIUM: 139 mmol/L (ref 135–145)
TOTAL PROTEIN: 6.9 g/dL (ref 6.5–8.1)

## 2016-10-02 MED ORDER — OXYCODONE-ACETAMINOPHEN 5-325 MG PO TABS: 1.0000 | ORAL_TABLET | Freq: Four times a day (QID) | ORAL | 0 refills | Status: AC | PRN

## 2016-10-02 MED ORDER — IOPAMIDOL (ISOVUE-300) INJECTION 61%
30.0000 mL | Freq: Once | INTRAVENOUS | Status: AC | PRN
  Administered 2016-10-02: 30 mL via ORAL

## 2016-10-02 MED ORDER — MORPHINE SULFATE (PF) 2 MG/ML IV SOLN
4.0000 mg | Freq: Once | INTRAVENOUS | Status: AC
Start: 2016-10-02 — End: 2016-10-02
  Administered 2016-10-02: 4 mg via INTRAVENOUS
  Filled 2016-10-02: qty 2

## 2016-10-02 MED ORDER — SULFAMETHOXAZOLE-TRIMETHOPRIM 800-160 MG PO TABS: 1.0000 | ORAL_TABLET | Freq: Two times a day (BID) | ORAL | 0 refills | Status: DC

## 2016-10-02 MED ORDER — DEXTROSE 5 % IV SOLN: 1.0000 g | Freq: Once | INTRAVENOUS | Status: DC

## 2016-10-02 MED ORDER — SULFAMETHOXAZOLE-TRIMETHOPRIM 800-160 MG PO TABS
1.0000 | ORAL_TABLET | Freq: Once | ORAL | Status: AC
  Administered 2016-10-02: 1 via ORAL
  Filled 2016-10-02: qty 1

## 2016-10-02 MED ORDER — OXYCODONE-ACETAMINOPHEN 5-325 MG PO TABS
2.0000 | ORAL_TABLET | Freq: Once | ORAL | Status: AC
  Administered 2016-10-02: 2 via ORAL
  Filled 2016-10-02: qty 2

## 2016-10-02 MED ORDER — IOPAMIDOL (ISOVUE-300) INJECTION 61%
100.0000 mL | Freq: Once | INTRAVENOUS | Status: AC | PRN
  Administered 2016-10-02: 100 mL via INTRAVENOUS

## 2016-10-02 MED ORDER — ONDANSETRON HCL 4 MG/2ML IJ SOLN
4.0000 mg | Freq: Once | INTRAMUSCULAR | Status: AC
  Administered 2016-10-02: 4 mg via INTRAVENOUS
  Filled 2016-10-02: qty 2

## 2016-10-02 MED ORDER — CEFTRIAXONE SODIUM-DEXTROSE 1-3.74 GM-% IV SOLR
1.0000 g | Freq: Once | INTRAVENOUS | Status: AC
  Administered 2016-10-02: 1 g via INTRAVENOUS
  Filled 2016-10-02: qty 50

## 2016-10-02 NOTE — ED Notes (Signed)
Pt given warm blankets.

## 2016-10-02 NOTE — ED Notes (Signed)
Pt given dressing change and updated on plan of care.

## 2016-10-02 NOTE — ED Notes (Addendum)
Pt expressed no needs at this time. Call bell in reach.

## 2016-10-02 NOTE — Discharge Instructions (Signed)
Take the Bactrim 1 pill twice a day. That is the antibiotic. Rememberto breast feed on that. Take the Percocet for pain 1 pill 4 times a day if needed. The murmur that can make you sleepy. you can also make you constipated.  You should not drive while you're taking the Percocet. Please return for worse pain fever vomiting weakness or if the redness goes outside the line. Please follow-up with Dr. Chauncey CruelStabler or Dr. Tiburcio PeaHarris on Monday.

## 2016-10-02 NOTE — ED Provider Notes (Signed)
[facility] Emergency Department Provider Note   ____________________________________________   First MD Initiated Contact with Patient 10/02/16 0110     (approximate)  I have reviewed the triage vital signs and the nursing notes.   HISTORY  Chief Complaint Post-op Problem    HPI Betty Gomez is a 30 y.o. female patient reports she's had a burning pain at the site of her incision from her tubal ligation since it occurred earlier this month. Today she noticed some foul-smelling greenish discharge from the site and she noticed that her abdomen is been somewhat swollen and red around the incision. She's not been running a fever. She complains of pain is getting worse. Since at least moderate at this point.   Past Medical History:  Diagnosis Date  . Anxiety   . Asthma   . Depression    ok, came off meds, has someone she sees for this.  . Endometriosis   . Infection    UTI    Patient Active Problem List   Diagnosis Date Noted  . Normal labor 09/20/2016  . Labor and delivery, indication for care 09/08/2016  . Decreased fetal movement 07/24/2016  . Pregnancy 09/15/2015  . Insufficient prenatal care 09/15/2015  . Indication for care in labor or delivery 09/10/2015  . Braxton Hicks contractions 09/08/2015  . Candida albicans infection 08/16/2015  . Abnormal antenatal AFP screen 05/24/2015  . Irregular contractions 05/24/2015    Past Surgical History:  Procedure Laterality Date  . DIAGNOSTIC LAPAROSCOPY     endometriosis  . TUBAL LIGATION N/A 09/17/2015   Procedure: POST PARTUM TUBAL LIGATION;  Surgeon: Vena Austria, MD;  Location: [facility] ORS;  Service: Gynecology;  Laterality: N/A;  . TUBAL LIGATION Bilateral 09/21/2016   Procedure: POST PARTUM TUBAL LIGATION;  Surgeon: Nadara Mustard, MD;  Location: [facility] ORS;  Service: Gynecology;  Laterality: Bilateral;    Prior to Admission medications   Medication Sig Start Date End Date Taking?  Authorizing Provider  ferrous sulfate 325 (65 FE) MG EC tablet Take 1 tablet (325 mg total) by mouth daily with breakfast. 09/22/16   Vena Austria, MD  ibuprofen (ADVIL,MOTRIN) 600 MG tablet Take 1 tablet (600 mg total) by mouth every 6 (six) hours. 09/22/16   Vena Austria, MD  oxyCODONE (OXY IR/ROXICODONE) 5 MG immediate release tablet Take 1 tablet (5 mg total) by mouth every 4 (four) hours as needed for breakthrough pain. 09/22/16   Vena Austria, MD  Prenatal Vit-Fe Fumarate-FA (MULTIVITAMIN-PRENATAL) 27-0.8 MG TABS tablet Take 1 tablet by mouth daily at 12 noon.    Historical Provider, MD    Allergies Review of patient's allergies indicates no known allergies.  Family History  Problem Relation Age of Onset  . Hypertension Mother   . Diabetes Mother   . Heart disease Mother 79    failure  . Heart disease Maternal Grandfather   . Diabetes Paternal Grandmother   . Hypertension Paternal Grandmother   . Cancer Paternal Grandfather     lung  . Hearing loss Neg Hx     Social History Social History  Substance Use Topics  . Smoking status: Light Tobacco Smoker    Packs/day: 0.25    Years: 10.00    Types: Cigarettes  . Smokeless tobacco: Never Used  . Alcohol use No    Review of Systems Constitutional: No fever/chills Eyes: No visual changes. ENT: No sore throat. Cardiovascular: Denies chest pain. Respiratory: Denies shortness of breath. Gastrointestinal abdominal pain.  No nausea,  no vomiting.  No diarrhea.  No constipation. Genitourinary: Negative for dysuria. Musculoskeletal: Negative for back pain. Skin: Negative for rash.Except for redness on belly.  10-point ROS otherwise negative.  ____________________________________________   PHYSICAL EXAM:  VITAL SIGNS: ED Triage Vitals [10/02/16 0002]  Enc Vitals Group     BP 123/70     Pulse Rate 82     Resp 18     Temp 98.1 F (36.7 C)     Temp Source Oral     SpO2 97 %     Weight 176 lb (79.8 kg)      Height 5\' 6"  (1.676 m)     Head Circumference      Peak Flow      Pain Score 10     Pain Loc      Pain Edu?      Excl. in GC?     Constitutional: Alert and oriented. Well appearing and in no acute distress. Eyes: Conjunctivae are normal. PERRL. EOMI. Head: Atraumatic. Nose: No congestion/rhinnorhea. Mouth/Throat: Mucous membranes are moist.  Oropharynx non-erythematous. Neck: No stridor.  Cardiovascular: Normal rate, regular rhythm. Grossly normal heart sounds.  Good peripheral circulation. Respiratory: Normal respiratory effort.  No retractions. Lungs CTAB. Gastrointestinal:Patient's abdomen is read swollen and somewhat tender around the infraumbilical incision. This incision is draining foul-smelling mostly clear fluid at this time. The erythema is at least 6 inches in diameter. It is difficult to tell if the abdominal pain is due to the redness and swelling or something internal. Musculoskeletal: No lower extremity tenderness nor edema.  No joint effusions. Neurologic:  Normal speech and language. No gross focal neurologic deficits are appreciated. No gait instability. Skin:  Skin is warm, dry and intact. No rash noted except for erythema on abdomen..   ____________________________________________   LABS (all labs ordered are listed, but only abnormal results are displayed)  Labs Reviewed  CBC - Abnormal; Notable for the following:       Result Value   WBC 11.7 (*)    All other components within normal limits  COMPREHENSIVE METABOLIC PANEL - Abnormal; Notable for the following:    Glucose, Bld 102 (*)    Calcium 8.8 (*)    Albumin 3.4 (*)    Total Bilirubin <0.1 (*)    All other components within normal limits   ____________________________________________  EKG   ____________________________________________  RADIOLOGY  Study Result   CLINICAL DATA:  Post tubal ligation with on going burning and pain to the incision site  EXAM: CT ABDOMEN AND PELVIS WITH  CONTRAST  TECHNIQUE: Multidetector CT imaging of the abdomen and pelvis was performed using the standard protocol following bolus administration of intravenous contrast.  CONTRAST:  100mL ISOVUE-300 IOPAMIDOL (ISOVUE-300) INJECTION 61%  COMPARISON:  None.  FINDINGS: Lower chest: Lung bases show mild dependent atelectasis. No effusion or focal consolidation.  Hepatobiliary: No focal liver abnormality is seen. No gallstones, gallbladder wall thickening, or biliary dilatation.  Pancreas: Unremarkable. No pancreatic ductal dilatation or surrounding inflammatory changes.  Spleen: Normal in size without focal abnormality.  Adrenals/Urinary Tract: Adrenal glands are unremarkable. Kidneys are normal, without renal calculi, focal lesion, or hydronephrosis. Bladder is unremarkable.  Stomach/Bowel: Stomach is within normal limits. Appendix appears normal. No evidence of bowel wall thickening, distention, or inflammatory changes.  Vascular/Lymphatic: No significant vascular findings are present. No enlarged abdominal or pelvic lymph nodes.  Reproductive: The uterus is enlarged with thickened endometrial stripe, fell consistent with recent postpartum status.  Other: There is  anterior abdominal wall skin thickening. There is a mildly rim enhancing fluid collection in the infraumbilical region containing small air bubbles. This measures 4.2 cm AP by 2.2 cm craniocaudad by 3.1 cm transverse. It may represent a developing abscess or postsurgical fluid collection, with or without infection. There is no free air or free fluid within the abdomen or pelvis.  Musculoskeletal: No acute osseous abnormality. No suspicious bone lesion.  IMPRESSION: 1. 4.2 cm mildly rim enhancing fluid collection containing small air bubbles in the infraumbilical region, there is associated subcutaneous soft tissue stranding and overlying skin thickening. Findings could relate to a postsurgical  fluid collection with or without infection, or possible developing abscess. 2. Enlarged uterus with prominent endometrial stripe presumably related to recent postpartum status.   Electronically Signed   By: Jasmine PangKim  Fujinaga M.D.   On: 10/02/2016 04:08    ____________________________________________   PROCEDURES  Procedure(s) performed:   Procedures  Critical Care performed:   ____________________________________________   INITIAL IMPRESSION / ASSESSMENT AND PLAN / ED COURSE  Pertinent labs & imaging results that were available during my care of the patient were reviewed by me and considered in my medical decision making (see chart for details).    Clinical Course   Discussed patient with Dr. Chauncey CruelStabler who is on-call for Dr. Tiburcio PeaHarris today. Read him the CT report. Dr. Chauncey CruelStabler wants me to give her Bactrim if she is not breast-feeding her clindamycin if she is breast-feeding patient is not breast-feeding so he is Bactrim and sent her for follow-up on Monday. He is aware of the fluid collection etc. He feels that that will heal very well especially since it is draining now with only the by mouth antibiotics. Went back to talk to the patient and found that the cellulitis has expanded somewhat. It is now at least 8 cm when I measure it I will give her a dose of Rocephin and then begin the year Bactrim here. I drew a line around the erythema she will return if the erythema goes outside of the line.  ____________________________________________   FINAL CLINICAL IMPRESSION(S) / ED DIAGNOSES  Final diagnoses:  Postoperative infection, initial encounter      NEW MEDICATIONS STARTED DURING THIS VISIT:  New Prescriptions   No medications on file     Note:  This document was prepared using Dragon voice recognition software and may include unintentional dictation errors.    Arnaldo NatalPaul F Jalik Gellatly, MD 10/02/16 [phone]

## 2016-10-02 NOTE — ED Notes (Signed)
Pt resting. No needs expressed.

## 2017-09-21 ENCOUNTER — Encounter: Payer: Self-pay | Admitting: Emergency Medicine

## 2017-09-21 ENCOUNTER — Emergency Department
Admission: EM | Admit: 2017-09-21 | Discharge: 2017-09-21 | Disposition: A | Discharge status: Home / Self Care | Payer: Medicaid Other | Attending: Emergency Medicine | Admitting: Emergency Medicine

## 2017-09-21 ENCOUNTER — Emergency Department: Payer: Medicaid Other

## 2017-09-21 DIAGNOSIS — W19XXXA Unspecified fall, initial encounter: Secondary | ICD-10-CM

## 2017-09-21 DIAGNOSIS — R062 Wheezing: Secondary | ICD-10-CM

## 2017-09-21 DIAGNOSIS — F17210 Nicotine dependence, cigarettes, uncomplicated: Secondary | ICD-10-CM | POA: Insufficient documentation

## 2017-09-21 DIAGNOSIS — Z23 Encounter for immunization: Secondary | ICD-10-CM | POA: Insufficient documentation

## 2017-09-21 DIAGNOSIS — F322 Major depressive disorder, single episode, severe without psychotic features: Secondary | ICD-10-CM | POA: Insufficient documentation

## 2017-09-21 DIAGNOSIS — Y929 Unspecified place or not applicable: Secondary | ICD-10-CM | POA: Insufficient documentation

## 2017-09-21 DIAGNOSIS — Y9339 Activity, other involving climbing, rappelling and jumping off: Secondary | ICD-10-CM | POA: Insufficient documentation

## 2017-09-21 DIAGNOSIS — R45851 Suicidal ideations: Secondary | ICD-10-CM

## 2017-09-21 DIAGNOSIS — F329 Major depressive disorder, single episode, unspecified: Secondary | ICD-10-CM | POA: Diagnosis present

## 2017-09-21 DIAGNOSIS — Y999 Unspecified external cause status: Secondary | ICD-10-CM | POA: Insufficient documentation

## 2017-09-21 DIAGNOSIS — F1310 Sedative, hypnotic or anxiolytic abuse, uncomplicated: Secondary | ICD-10-CM

## 2017-09-21 DIAGNOSIS — T148XXA Other injury of unspecified body region, initial encounter: Secondary | ICD-10-CM | POA: Diagnosis not present

## 2017-09-21 DIAGNOSIS — W1789XA Other fall from one level to another, initial encounter: Secondary | ICD-10-CM | POA: Insufficient documentation

## 2017-09-21 DIAGNOSIS — Z7289 Other problems related to lifestyle: Secondary | ICD-10-CM | POA: Diagnosis not present

## 2017-09-21 DIAGNOSIS — J45909 Unspecified asthma, uncomplicated: Secondary | ICD-10-CM | POA: Insufficient documentation

## 2017-09-21 DIAGNOSIS — F4325 Adjustment disorder with mixed disturbance of emotions and conduct: Secondary | ICD-10-CM

## 2017-09-21 LAB — CBC
HCT: 37.5 % (ref 35.0–47.0)
HEMOGLOBIN: 12.6 g/dL (ref 12.0–16.0)
MCH: 30.7 pg (ref 26.0–34.0)
MCHC: 33.5 g/dL (ref 32.0–36.0)
MCV: 91.5 fL (ref 80.0–100.0)
PLATELETS: 239 10*3/uL (ref 150–440)
RBC: 4.1 MIL/uL (ref 3.80–5.20)
RDW: 14.4 % (ref 11.5–14.5)
WBC: 8.4 10*3/uL (ref 3.6–11.0)

## 2017-09-21 LAB — ETHANOL

## 2017-09-21 LAB — COMPREHENSIVE METABOLIC PANEL
ALT: 24 U/L (ref 14–54)
ANION GAP: 12 (ref 5–15)
AST: 19 U/L (ref 15–41)
Albumin: 4 g/dL (ref 3.5–5.0)
Alkaline Phosphatase: 67 U/L (ref 38–126)
BUN: 16 mg/dL (ref 6–20)
CALCIUM: 9 mg/dL (ref 8.9–10.3)
CHLORIDE: 105 mmol/L (ref 101–111)
CO2: 23 mmol/L (ref 22–32)
CREATININE: 0.89 mg/dL (ref 0.44–1.00)
Glucose, Bld: 76 mg/dL (ref 65–99)
Potassium: 3.4 mmol/L — ABNORMAL LOW (ref 3.5–5.1)
SODIUM: 140 mmol/L (ref 135–145)
Total Bilirubin: 0.8 mg/dL (ref 0.3–1.2)
Total Protein: 7.2 g/dL (ref 6.5–8.1)

## 2017-09-21 LAB — ACETAMINOPHEN LEVEL

## 2017-09-21 LAB — SALICYLATE LEVEL

## 2017-09-21 MED ORDER — BACITRACIN ZINC 500 UNIT/GM EX OINT
TOPICAL_OINTMENT | Freq: Two times a day (BID) | CUTANEOUS | Status: DC
  Administered 2017-09-21: 1 via TOPICAL
  Filled 2017-09-21: qty 0.9

## 2017-09-21 MED ORDER — TETANUS-DIPHTH-ACELL PERTUSSIS 5-2.5-18.5 LF-MCG/0.5 IM SUSP
0.5000 mL | Freq: Once | INTRAMUSCULAR | Status: AC
  Administered 2017-09-21: 0.5 mL via INTRAMUSCULAR
  Filled 2017-09-21: qty 0.5

## 2017-09-21 MED ORDER — NICOTINE 14 MG/24HR TD PT24
MEDICATED_PATCH | TRANSDERMAL | Status: AC
  Administered 2017-09-21: 14 mg via TRANSDERMAL
  Filled 2017-09-21: qty 1

## 2017-09-21 MED ORDER — ACETAMINOPHEN 325 MG PO TABS
650.0000 mg | ORAL_TABLET | Freq: Once | ORAL | Status: AC
  Administered 2017-09-21: 650 mg via ORAL
  Filled 2017-09-21: qty 2

## 2017-09-21 MED ORDER — NICOTINE 14 MG/24HR TD PT24
14.0000 mg | MEDICATED_PATCH | Freq: Once | TRANSDERMAL | Status: DC
  Administered 2017-09-21: 14 mg via TRANSDERMAL

## 2017-09-21 MED ORDER — ALBUTEROL SULFATE (2.5 MG/3ML) 0.083% IN NEBU
3.0000 mL | INHALATION_SOLUTION | Freq: Once | RESPIRATORY_TRACT | Status: AC
  Administered 2017-09-21: 3 mL via RESPIRATORY_TRACT
  Filled 2017-09-21: qty 3

## 2017-09-21 NOTE — ED Notes (Signed)
Pt to be discharged

## 2017-09-21 NOTE — ED Notes (Signed)
[facility] via ED tech and [employer] officer.

## 2017-09-21 NOTE — ED Notes (Signed)
Pt discharged to lobby. All belongings returned to patient. Pt denies SI/HI and AVH. Discharge paperwork reviewed with patient.
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
## 2017-09-21 NOTE — ED Notes (Signed)
Pt ambulatory to toilet without difficulty. 

## 2017-09-21 NOTE — ED Notes (Signed)
Pt unable to urinate at this time, pt given cup of water and asked to let RN or tech know when she is able to provide specimen.

## 2017-09-21 NOTE — ED Notes (Addendum)
Pt making phone calls to plan for discharge. Pt became tearful and stated she needed to get home to be with her children.  Maintained on 15 minute checks and observation by security camera for safety.
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
## 2017-09-21 NOTE — ED Notes (Signed)
Pt IVC'd upon arrival.

## 2017-09-21 NOTE — ED Notes (Addendum)
Patient transported to X-ray; escorted via [employer] officer.

## 2017-09-21 NOTE — ED Notes (Signed)
TTS to bedside at this time. 

## 2017-09-21 NOTE — ED Notes (Signed)
Pt provided lunch tray.

## 2017-09-21 NOTE — ED Provider Notes (Signed)
The patient has been evaluated at bedside by Dr. Toni Amendlapacs, psychiatry.  Patient is clinically stable.  Not felt to be a danger to self or others.  No SI or Hi.  No indication for inpatient psychiatric admission at this time.  Appropriate for continued outpatient therapy.    Willy Eddyobinson, Clearnce Leja, MD 09/21/17 [phone]
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
## 2017-09-23 ENCOUNTER — Emergency Department
Admission: EM | Admit: 2017-09-23 | Discharge: 2017-09-24 | Disposition: A | Discharge status: Other Acute Inpatient Hospital | Payer: Medicaid Other | Attending: Emergency Medicine | Admitting: Emergency Medicine

## 2017-09-23 ENCOUNTER — Encounter: Payer: Self-pay | Admitting: Emergency Medicine

## 2017-09-23 DIAGNOSIS — Z79899 Other long term (current) drug therapy: Secondary | ICD-10-CM | POA: Diagnosis not present

## 2017-09-23 DIAGNOSIS — F32A Depression, unspecified: Secondary | ICD-10-CM

## 2017-09-23 DIAGNOSIS — Z046 Encounter for general psychiatric examination, requested by authority: Secondary | ICD-10-CM | POA: Diagnosis not present

## 2017-09-23 DIAGNOSIS — F17210 Nicotine dependence, cigarettes, uncomplicated: Secondary | ICD-10-CM | POA: Insufficient documentation

## 2017-09-23 DIAGNOSIS — F329 Major depressive disorder, single episode, unspecified: Secondary | ICD-10-CM | POA: Diagnosis not present

## 2017-09-23 DIAGNOSIS — R456 Violent behavior: Secondary | ICD-10-CM | POA: Diagnosis present

## 2017-09-23 DIAGNOSIS — J45909 Unspecified asthma, uncomplicated: Secondary | ICD-10-CM | POA: Diagnosis not present

## 2017-09-23 LAB — CBC
HEMATOCRIT: 35.8 % (ref 35.0–47.0)
HEMOGLOBIN: 12.3 g/dL (ref 12.0–16.0)
MCH: 31.2 pg (ref 26.0–34.0)
MCHC: 34.4 g/dL (ref 32.0–36.0)
MCV: 90.8 fL (ref 80.0–100.0)
Platelets: 245 10*3/uL (ref 150–440)
RBC: 3.94 MIL/uL (ref 3.80–5.20)
RDW: 14.1 % (ref 11.5–14.5)
WBC: 6.3 10*3/uL (ref 3.6–11.0)

## 2017-09-23 LAB — COMPREHENSIVE METABOLIC PANEL
ALBUMIN: 4 g/dL (ref 3.5–5.0)
ALT: 24 U/L (ref 14–54)
AST: 19 U/L (ref 15–41)
Alkaline Phosphatase: 69 U/L (ref 38–126)
Anion gap: 10 (ref 5–15)
BILIRUBIN TOTAL: 0.5 mg/dL (ref 0.3–1.2)
BUN: 12 mg/dL (ref 6–20)
CO2: 24 mmol/L (ref 22–32)
Calcium: 9.1 mg/dL (ref 8.9–10.3)
Chloride: 102 mmol/L (ref 101–111)
Creatinine, Ser: 0.81 mg/dL (ref 0.44–1.00)
GFR calc Af Amer: >60 mL/min (ref >60)
GFR calc non Af Amer: >60 mL/min (ref >60)
GLUCOSE: 90 mg/dL (ref 65–99)
POTASSIUM: 3.4 mmol/L — AB (ref 3.5–5.1)
Sodium: 136 mmol/L (ref 135–145)
Total Protein: 7 g/dL (ref 6.5–8.1)

## 2017-09-23 LAB — ETHANOL: Alcohol, Ethyl (B): <10 mg/dL (ref <10)

## 2017-09-23 LAB — HCG, QUANTITATIVE, PREGNANCY: hCG, Beta Chain, Quant, S: 1 m[IU]/mL (ref <5)

## 2017-09-23 LAB — ACETAMINOPHEN LEVEL: Acetaminophen (Tylenol), Serum: <10 ug/mL — ABNORMAL LOW (ref 10–30)

## 2017-09-23 LAB — SALICYLATE LEVEL: Salicylate Lvl: <7 mg/dL (ref 2.8–30.0)

## 2017-09-23 NOTE — ED Notes (Signed)
SOC report given to Dr Sprague att 

## 2017-09-23 NOTE — ED Provider Notes (Addendum)
[facility] Emergency Department Provider Note  ____________________________________________   First MD Initiated Contact with Patient 09/23/17 2011     (approximate)  I have reviewed the triage vital signs and the nursing notes.   HISTORY  Chief Complaint IVC   HPI Betty Gomez is a 31 y.o. female who comes to the emergency department on an involuntary commitment from [facility] after family placed her on an involuntary commitment. Today she went to [facility] for help as she has been having difficulty at home. She is voluntary at [facility] however her family members involuntarily committed her. They say that she has been in a slow steady decline and he recently has been behaving erratically. Her behavior is worse when facing interpersonal conflict and improved when not.   Past Medical History:  Diagnosis Date  . Anxiety   . Asthma   . Depression    ok, came off meds, has someone she sees for this.  . Endometriosis   . Infection    UTI    Patient Active Problem List   Diagnosis Date Noted  . 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 Benzodiazepine abuse ([facility]) 09/21/2017  . Normal labor 09/20/2016  . Labor and delivery, indication for care 09/08/2016  . Decreased fetal movement 07/24/2016  . Pregnancy 09/15/2015  . Insufficient prenatal care 09/15/2015  . Indication for care in labor or delivery 09/10/2015  . Braxton Hicks contractions 09/08/2015  . Candida albicans infection 08/16/2015  . Abnormal antenatal AFP screen 05/24/2015  . Irregular contractions 05/24/2015    Past Surgical History:  Procedure Laterality Date  . DIAGNOSTIC LAPAROSCOPY     endometriosis  . TUBAL LIGATION N/A 09/17/2015   Procedure: POST PARTUM TUBAL LIGATION;  Surgeon: Vena Austria, MD;  Location: [facility] ORS;  Service: Gynecology;  Laterality: N/A;  . TUBAL LIGATION Bilateral 09/21/2016   Procedure: POST PARTUM TUBAL  LIGATION;  Surgeon: Nadara Mustard, MD;  Location: [facility] ORS;  Service: Gynecology;  Laterality: Bilateral;    Prior to Admission medications   Medication Sig Start Date End Date Taking? Authorizing Provider  ferrous sulfate 325 (65 FE) MG EC tablet Take 1 tablet (325 mg total) by mouth daily with breakfast. 09/22/16   Vena Austria, MD  ibuprofen (ADVIL,MOTRIN) 600 MG tablet Take 1 tablet (600 mg total) by mouth every 6 (six) hours. 09/22/16   Vena Austria, MD  oxyCODONE (OXY IR/ROXICODONE) 5 MG immediate release tablet Take 1 tablet (5 mg total) by mouth every 4 (four) hours as needed for breakthrough pain. 09/22/16   Vena Austria, MD  oxyCODONE-acetaminophen (ROXICET) 5-325 MG tablet Take 1 tablet by mouth every 6 (six) hours as needed. 10/02/16 10/02/17  Arnaldo Natal, MD  Prenatal Vit-Fe Fumarate-FA (MULTIVITAMIN-PRENATAL) 27-0.8 MG TABS tablet Take 1 tablet by mouth daily at 12 noon.    [provider]  sulfamethoxazole-trimethoprim (BACTRIM DS,SEPTRA DS) 800-160 MG tablet Take 1 tablet by mouth 2 (two) times daily. 10/02/16   Arnaldo Natal, MD    Allergies Patient has no known allergies.  Family History  Problem Relation Age of Onset  . Hypertension Mother   . Diabetes Mother   . Heart disease Mother 23       failure  . Heart disease Maternal Grandfather   . Diabetes Paternal Grandmother   . Hypertension Paternal Grandmother   . Cancer Paternal Grandfather        lung  . Hearing loss Neg  Hx     Social History Social History  Substance Use Topics  . Smoking status: Heavy Tobacco Smoker    Packs/day: 1.00    Years: 10.00    Types: Cigarettes  . Smokeless tobacco: Never Used  . Alcohol use No    Review of Systems Constitutional: No fever/chills Eyes: No visual changes. ENT: No sore throat. Cardiovascular: Denies chest pain. Respiratory: Denies shortness of breath. Gastrointestinal: No abdominal pain.  No nausea, no vomiting.  No diarrhea.   No constipation. Genitourinary: Negative for dysuria. Musculoskeletal: Negative for back pain. Skin: Negative for rash. Neurological: Negative for headaches, focal weakness or numbness.   ____________________________________________   PHYSICAL EXAM:  VITAL SIGNS: ED Triage Vitals  Enc Vitals Group     BP 09/23/17 1845 (!) 112/43     Pulse Rate 09/23/17 1845 (!) 56     Resp 09/23/17 1845 18     Temp 09/23/17 1845 97.8 F (36.6 C)     Temp Source 09/23/17 1845 Oral     SpO2 09/23/17 1845 100 %     Weight 09/23/17 1845 165 lb (74.8 kg)     Height 09/23/17 1845 5\' 4"  (1.626 m)     Head Circumference --      Peak Flow --      Pain Score 09/23/17 1855 3     Pain Loc --      Pain Edu? --      Excl. in GC? --     Constitutional: alert and oriented 4 pleasant cooperative  Diaphoresis Eyes: PERRL EOMI. Head: Atraumatic. Nose: No congestion/rhinnorhea. Mouth/Throat: No trismus Neck: No stridor.   Cardiovascular: Normal rate, regular rhythm. Grossly normal heart sounds.  Good peripheral circulation. Respiratory: Normal respiratory effort.  No retractions. Lungs CTAB and moving good air Gastrointestinal: soft nontender Musculoskeletal: No lower extremity edema   Neurologic:  Normal speech and language. No gross focal neurologic deficits are appreciated. Skin:  Skin is warm, dry and intact. No rash noted. Psychiatric: sad affect   ____________________________________________   DIFFERENTIAL includes but not limited to  schizophrenia, schizoaffective disorder, suicidal ideation, homicidal ideation, medication noncompliance, drug overdose ____________________________________________   LABS (all labs ordered are listed, but only abnormal results are displayed)  Labs Reviewed  COMPREHENSIVE METABOLIC PANEL - Abnormal; Notable for the following:       Result Value   Potassium 3.4 (*)    All other components within normal limits  ACETAMINOPHEN LEVEL - Abnormal; Notable for  the following:    Acetaminophen (Tylenol), Serum <10 (*)    All other components within normal limits  ETHANOL  CBC  HCG, QUANTITATIVE, PREGNANCY  SALICYLATE LEVEL  URINE DRUG SCREEN, QUALITATIVE ([facility] ONLY)    blood work reviewed and interpreted by me shows no acute disease __________________________________________  EKG   ____________________________________________  RADIOLOGY   ____________________________________________   PROCEDURES  Procedure(s) performed: no  Procedures  Critical Care performed: no  Observation: no ____________________________________________   INITIAL IMPRESSION / ASSESSMENT AND PLAN / ED COURSE  Pertinent labs & imaging results that were available during my care of the patient were reviewed by me and considered in my medical decision making (see chart for details).  the patient is clearly depressed. I will hold or involuntary commitment at this time and recheck to the specialist on-call. She is medically stable for psychiatric evaluation      ____________________________________________   FINAL CLINICAL IMPRESSION(S) / ED DIAGNOSES  Final diagnoses:  Depression, unspecified depression type  NEW MEDICATIONS STARTED DURING THIS VISIT:  New Prescriptions   No medications on file     Note:  This document was prepared using Dragon voice recognition software and may include unintentional dictation errors.     Merrily Brittleifenbark, Garren Greenman, MD 09/23/17 [id number]    Merrily Brittleifenbark, Gino Garrabrant, MD 09/23/17 2324
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
## 2017-09-24 LAB — URINE DRUG SCREEN, QUALITATIVE (ARMC ONLY)
AMPHETAMINES, UR SCREEN: POSITIVE — AB
BARBITURATES, UR SCREEN: NOT DETECTED
Benzodiazepine, Ur Scrn: POSITIVE — AB
COCAINE METABOLITE, UR ~~LOC~~: POSITIVE — AB
Cannabinoid 50 Ng, Ur ~~LOC~~: POSITIVE — AB
MDMA (ECSTASY) UR SCREEN: NOT DETECTED
METHADONE SCREEN, URINE: NOT DETECTED
Opiate, Ur Screen: NOT DETECTED
Phencyclidine (PCP) Ur S: NOT DETECTED
TRICYCLIC, UR SCREEN: NOT DETECTED

## 2017-09-24 MED ORDER — FLUOXETINE HCL 20 MG PO CAPS
40.0000 mg | ORAL_CAPSULE | Freq: Once | ORAL | Status: AC
  Administered 2017-09-24: 40 mg via ORAL
  Filled 2017-09-24: qty 2

## 2017-09-24 MED ORDER — ALPRAZOLAM 0.5 MG PO TABS
0.5000 mg | ORAL_TABLET | Freq: Once | ORAL | Status: AC
  Administered 2017-09-24: 0.5 mg via ORAL
  Filled 2017-09-24: qty 1

## 2017-09-24 NOTE — ED Notes (Signed)

## 2017-09-24 NOTE — ED Notes (Signed)
Patient has been tearful and anxious this shift.  She states "I do not want to be here."  POC explained to patient and medication given for anxiety. Denies SI/HI. U Family update given,

## 2017-09-24 NOTE — BH Assessment (Signed)
Patient has been accepted [facility].  Patient assigned to Dual Diagnosis Unit Accepting physician is Dr. Cherlynn PoloParvesh Basnet.  [facility]  Representative was [phone].  ER Staff is aware of it Christen Bame(Ronnie, ER Sect.; Dr. Don PerkingVeronese, ER MD & Carlisle BeersLuann, Patient's Nurse)      Address: [address]  [place], [place] [address]

## 2017-09-24 NOTE — ED Notes (Addendum)
Incorrect pt  

## 2017-09-24 NOTE — ED Notes (Signed)
UDS to lab

## 2017-09-24 NOTE — BH Assessment (Signed)
Referral information for Psychiatric Hospitalization faxed to;      [place] [facility] ([phone] or [phone])   [facility] ([phone]),    [facility] ([phone]),    [facility] [facility] ([phone]),    [facility] ([phone]),    [facility] ([phone]),     [facility] ([phone]).     

## 2017-09-24 NOTE — ED Notes (Signed)
emtala  Reviewed by this RN

## 2017-09-24 NOTE — ED Notes (Signed)
Report was received from Nell RangeNoel W., RN; Pt. Verbalizes  complaints of having Depression; told family members that; she was going to kill herself; secondary to  Distress of having marital problems; children were taken by DSS; husband left her a few days ago; boyfriend died this week; decreased sleep; and displaying erratic behavior;  Verbalizes having S.I.; denies having Hi. Continue to monitor with 15 min. Monitoring.

## 2017-11-22 IMAGING — CT CT ABD-PELV W/ CM
2 of 5 series · 15 of 46 positions shown, 17 images · IV contrast (APPLIED)
Comparison: None.

CLINICAL DATA: Post tubal ligation with on going burning and pain
to the incision site

EXAM:
CT ABDOMEN AND PELVIS WITH CONTRAST
TECHNIQUE: Multidetector CT imaging of the abdomen and pelvis was performed
using the standard protocol following bolus administration of
intravenous contrast.
CONTRAST:  100mL 62JDRF-FTT IOPAMIDOL (62JDRF-FTT) INJECTION 61%

[Series 2: axial st · axial · 0.84mm/px · z∈[-852,-462]mm · 12 of 91 slices shown, 14 images]
[im 7/91  soft-tissue]
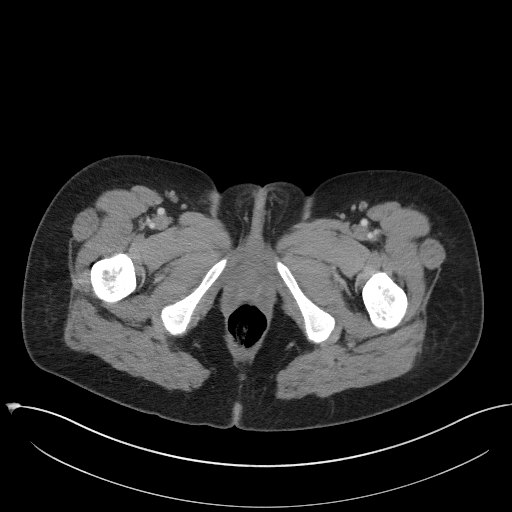
[im 7/91  bone]
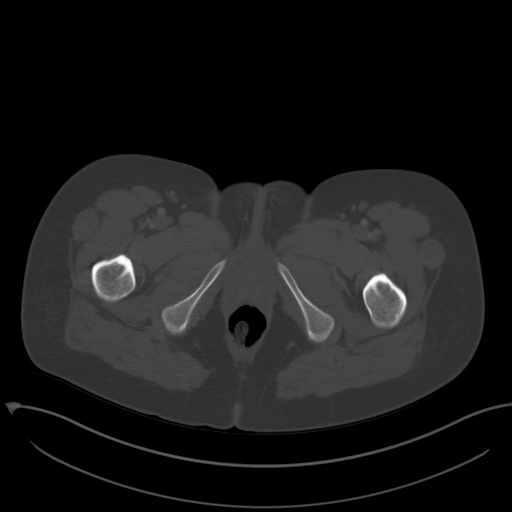
[im 13/91  soft-tissue]
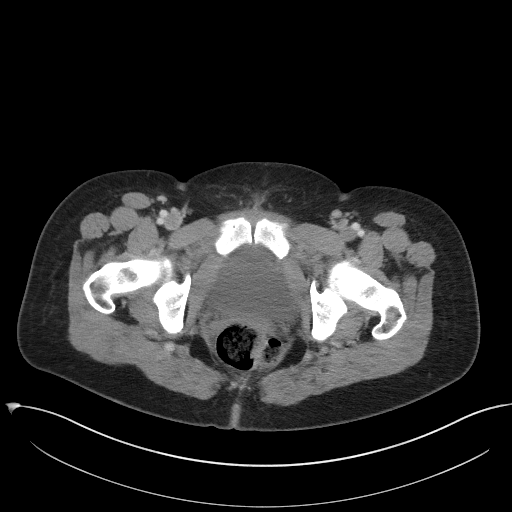
[im 19/91  soft-tissue]
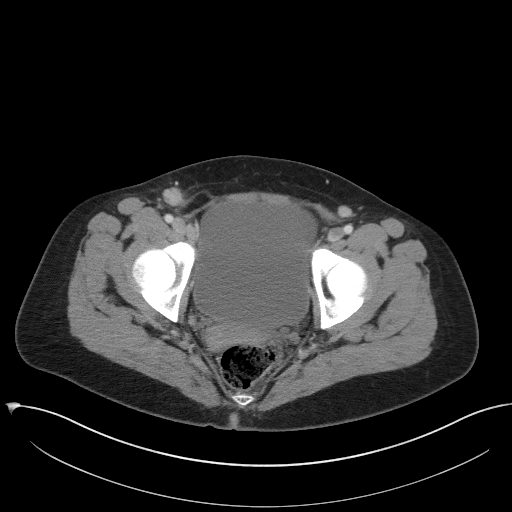
[im 31/91  soft-tissue]
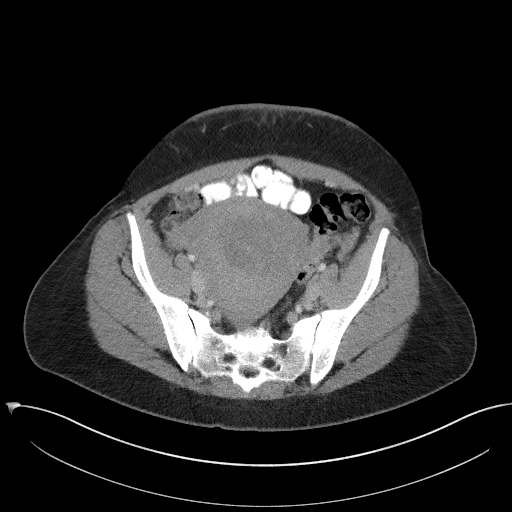
[im 37/91  soft-tissue]
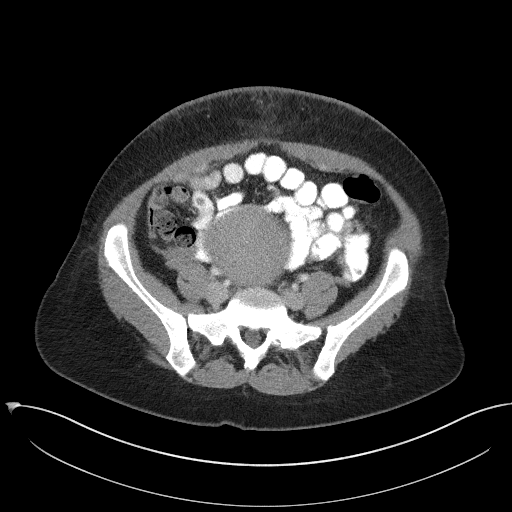
[im 43/91  soft-tissue]
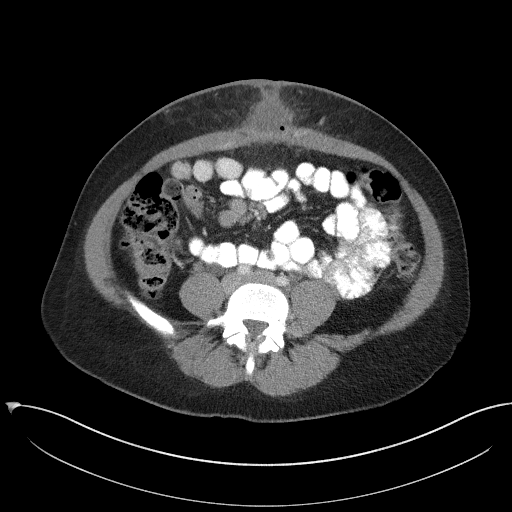
[im 49/91  soft-tissue]
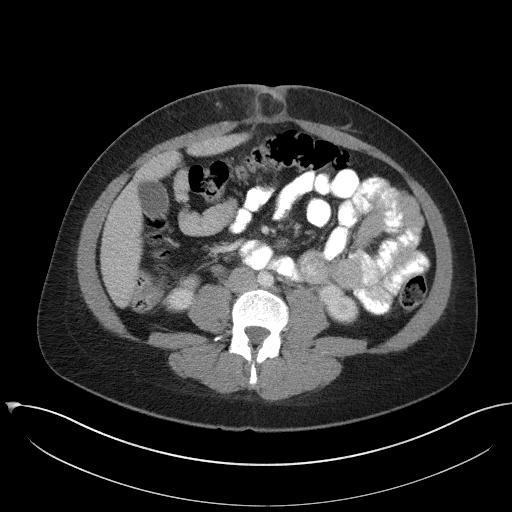
[im 55/91  soft-tissue]
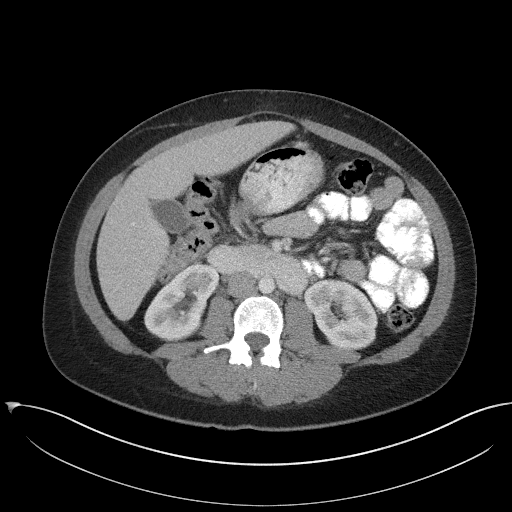
[im 61/91  soft-tissue]
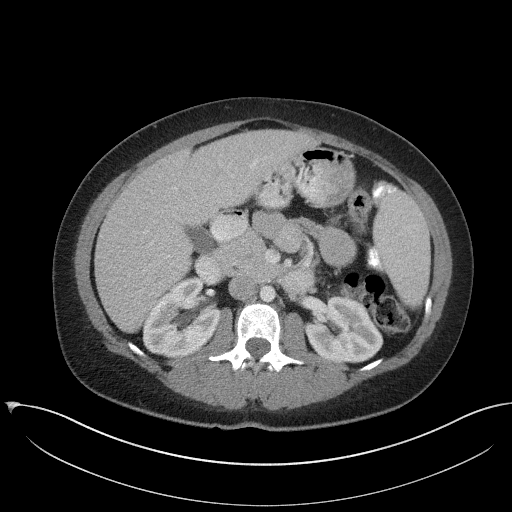
[im 61/91  bone]
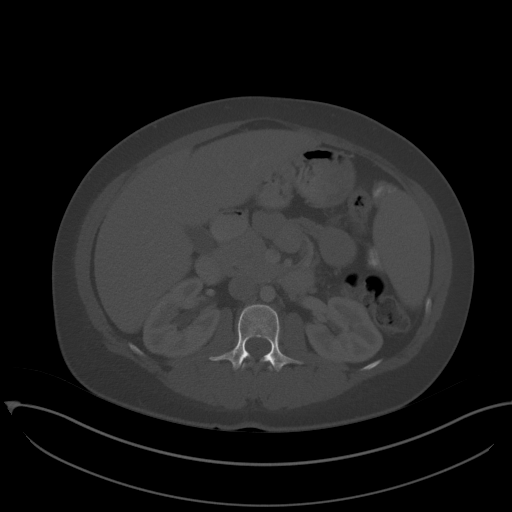
[im 73/91  soft-tissue]
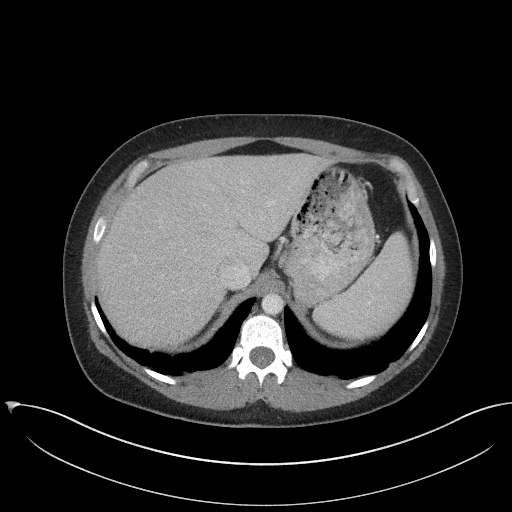
[im 79/91  soft-tissue]
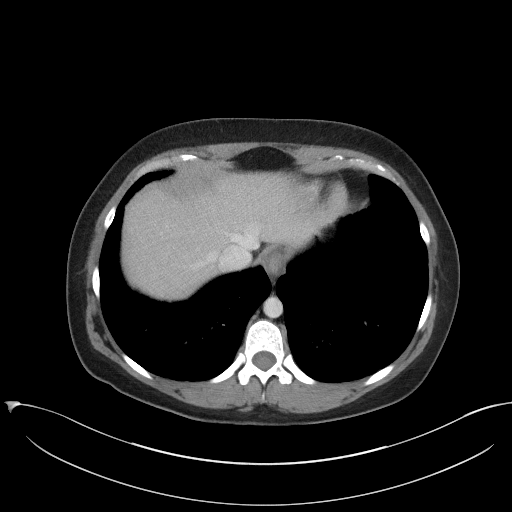
[im 85/91  soft-tissue]
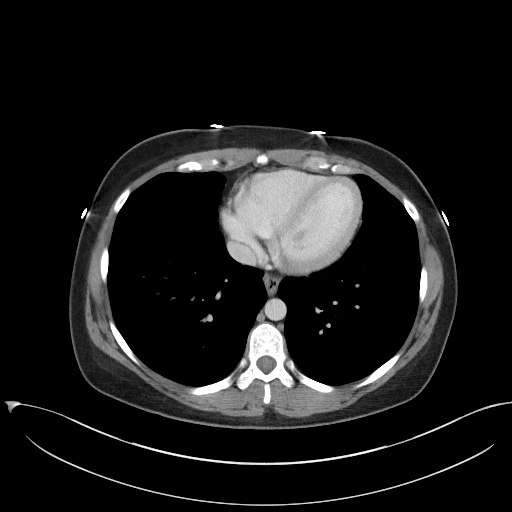

[Series 5: coronal st · coronal · 0.68mm/px · 3 of 101 slices shown]
[im 34/101  soft-tissue]
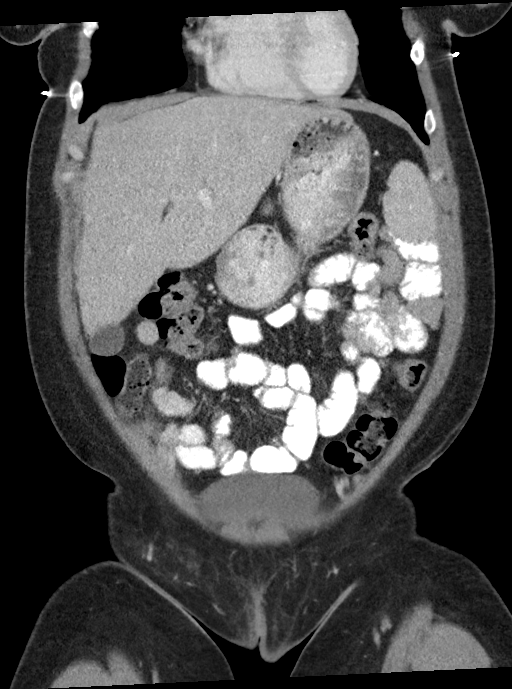
[im 45/101  soft-tissue]
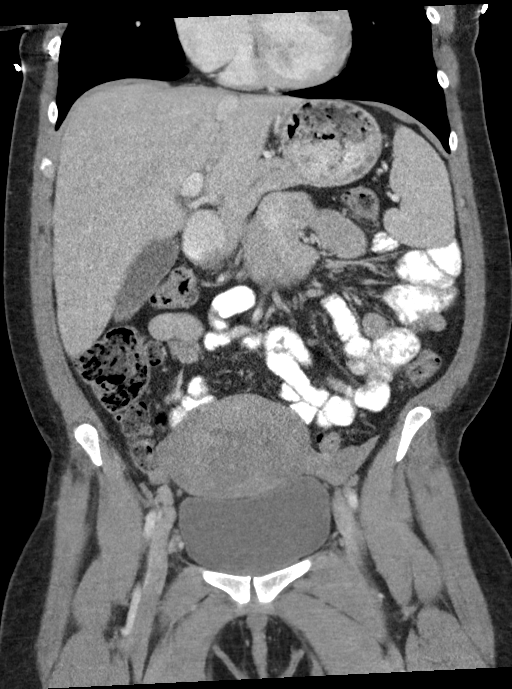
[im 56/101  soft-tissue]
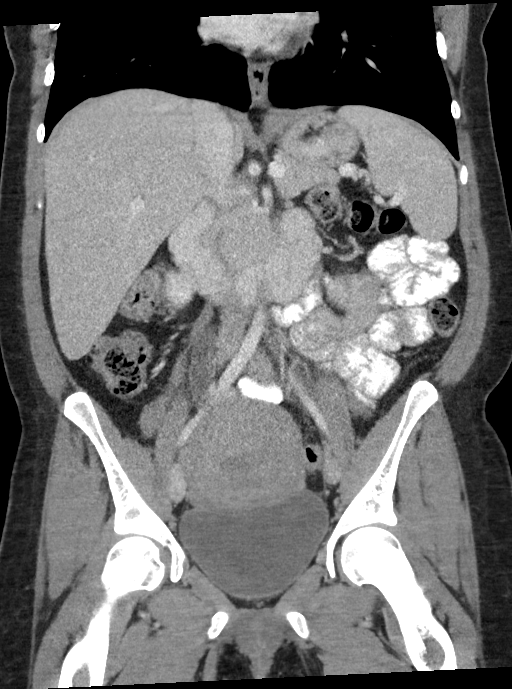

[15 of 46 positions shown; findings below may reference images not displayed]

FINDINGS: Lower chest: Lung bases show mild dependent atelectasis. No effusion
or focal consolidation.

Hepatobiliary: No focal liver abnormality is seen. No gallstones,
gallbladder wall thickening, or biliary dilatation.

Pancreas: Unremarkable. No pancreatic ductal dilatation or
surrounding inflammatory changes.

Spleen: Normal in size without focal abnormality.

Adrenals/Urinary Tract: Adrenal glands are unremarkable. Kidneys are
normal, without renal calculi, focal lesion, or hydronephrosis.
Bladder is unremarkable.

Stomach/Bowel: Stomach is within normal limits. Appendix appears
normal. No evidence of bowel wall thickening, distention, or
inflammatory changes.

Vascular/Lymphatic: No significant vascular findings are present. No
enlarged abdominal or pelvic lymph nodes.

Reproductive: The uterus is enlarged with thickened endometrial
stripe, fell consistent with recent postpartum status.

Other: There is anterior abdominal wall skin thickening. There is a
mildly rim enhancing fluid collection in the infraumbilical region
containing small air bubbles. This measures 4.2 cm AP by 2.2 cm
craniocaudad by 3.1 cm transverse. It may represent a developing
abscess or postsurgical fluid collection, with or without infection.
There is no free air or free fluid within the abdomen or pelvis.

Musculoskeletal: No acute osseous abnormality. No suspicious bone
lesion.
IMPRESSION: 1. 4.2 cm mildly rim enhancing fluid collection containing small air
bubbles in the infraumbilical region, there is associated
subcutaneous soft tissue stranding and overlying skin thickening.
Findings could relate to a postsurgical fluid collection with or
without infection, or possible developing abscess.
2. Enlarged uterus with prominent endometrial stripe presumably
related to recent postpartum status.

## 2018-04-19 ENCOUNTER — Other Ambulatory Visit: Payer: Self-pay

## 2018-04-19 ENCOUNTER — Encounter (HOSPITAL_COMMUNITY): Payer: Self-pay | Admitting: Emergency Medicine

## 2018-04-19 ENCOUNTER — Ambulatory Visit (HOSPITAL_COMMUNITY)
Admission: EM | Admit: 2018-04-19 | Discharge: 2018-04-19 | Disposition: A | Discharge status: Home / Self Care | Payer: Medicaid Other | Attending: Family Medicine | Admitting: Family Medicine

## 2018-04-19 DIAGNOSIS — J40 Bronchitis, not specified as acute or chronic: Secondary | ICD-10-CM

## 2018-04-19 MED ORDER — IPRATROPIUM-ALBUTEROL 0.5-2.5 (3) MG/3ML IN SOLN
3.0000 mL | Freq: Once | RESPIRATORY_TRACT | Status: AC
Start: 2018-04-19 — End: 2018-04-19
  Administered 2018-04-19: 3 mL via RESPIRATORY_TRACT

## 2018-04-19 MED ORDER — ALBUTEROL SULFATE HFA 108 (90 BASE) MCG/ACT IN AERS: 1.0000 | INHALATION_SPRAY | Freq: Four times a day (QID) | RESPIRATORY_TRACT | 0 refills | Status: DC | PRN

## 2018-04-19 MED ORDER — PREDNISONE 20 MG PO TABS: 40.0000 mg | ORAL_TABLET | Freq: Every day | ORAL | 0 refills | Status: AC

## 2018-04-19 MED ORDER — AZITHROMYCIN 250 MG PO TABS: 250.0000 mg | ORAL_TABLET | Freq: Every day | ORAL | 0 refills | Status: DC

## 2018-04-19 MED ORDER — TRIAMCINOLONE ACETONIDE 55 MCG/ACT NA AERO: 2.0000 | INHALATION_SPRAY | Freq: Every day | NASAL | 12 refills | Status: DC

## 2018-04-19 MED ORDER — IPRATROPIUM BROMIDE 0.06 % NA SOLN: 2.0000 | Freq: Four times a day (QID) | NASAL | 0 refills | Status: DC

## 2018-04-19 MED ORDER — IPRATROPIUM-ALBUTEROL 0.5-2.5 (3) MG/3ML IN SOLN
RESPIRATORY_TRACT | Status: AC
  Filled 2018-04-19: qty 3

## 2018-04-19 NOTE — Discharge Instructions (Signed)
Prednisone and azithromycin for bronchitis/asthma exacerbation. Albuterol as needed. Start nasacort, atrovent nasal spray for nasal congestion/drainage. You can use over the counter nasal saline rinse such as neti pot for nasal congestion. Keep hydrated, your urine should be clear to pale yellow in color. Tylenol/motrin for fever and pain. Monitor for any worsening of symptoms, chest pain, shortness of breath, wheezing, swelling of the throat, follow up for reevaluation.

## 2018-04-19 NOTE — ED Triage Notes (Signed)
The patient presented to the [facility] with a complaint of a cough and congestion and left ear pain x 2 days.

## 2018-04-19 NOTE — ED Provider Notes (Signed)
[facility]    CSN: [id number] Arrival date & time: 04/19/18  1317   History   Chief Complaint Chief Complaint  Patient presents with  . Otalgia  . Cough    HPI CARMINE YOUNGBERG is a 32 y.o. female.   32 year old female with history of asthma comes in for 1 week history of URI symptoms.  States she had productive cough, left-sided rib pain, rhinorrhea, nasal congestion, subjective fever.  She woke up yesterday with left ear pain.  States she has not needed to use her inhaler in a long time, but during this past week, has felt like she needed to use one.  She is felt wheezing that is worse at night, and feels as if she needs to take deeper breaths.  OTC Mucinex without relief.  She is a current every day smoker.      Past Medical History:  Diagnosis Date  . Anxiety   . Asthma   . Depression    ok, came off meds, has someone she sees for this.  . Endometriosis   . Infection    UTI    Patient Active Problem List   Diagnosis Date Noted  . 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 Benzodiazepine abuse (HCC) 09/21/2017  . Normal labor 09/20/2016  . Labor and delivery, indication for care 09/08/2016  . Decreased fetal movement 07/24/2016  . Pregnancy 09/15/2015  . Insufficient prenatal care 09/15/2015  . Indication for care in labor or delivery 09/10/2015  . Braxton Hicks contractions 09/08/2015  . Candida albicans infection 08/16/2015  . Abnormal antenatal AFP screen 05/24/2015  . Irregular contractions 05/24/2015    Past Surgical History:  Procedure Laterality Date  . DIAGNOSTIC LAPAROSCOPY     endometriosis  . TUBAL LIGATION N/A 09/17/2015   Procedure: POST PARTUM TUBAL LIGATION;  Surgeon: Vena Austria, MD;  Location: [facility] ORS;  Service: Gynecology;  Laterality: N/A;  . TUBAL LIGATION Bilateral 09/21/2016   Procedure: POST PARTUM TUBAL LIGATION;  Surgeon: Nadara Mustard, MD;  Location: [facility];  Service:  Gynecology;  Laterality: Bilateral;    OB History    Gravida  4   Para  4   Term  4   Preterm  0   AB  0   Living  4     SAB  0   TAB  0   Ectopic  0   Multiple  0   Live Births  4        Obstetric Comments  Pt states last infant contracted GBS even though pt treated in labor. Pt states infant caught it anyway- treated successfully. IF positive for GBS at 36 weeks this pregnancy - pt says plan is for C/S. 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, grtowing well         Home Medications    Prior to Admission medications   Medication Sig Start Date End Date Taking? Authorizing Provider  albuterol (PROVENTIL HFA;VENTOLIN HFA) 108 (90 Base) MCG/ACT inhaler Inhale 1-2 puffs into the lungs every 6 (six) hours as needed for wheezing or shortness of breath. 04/19/18   Cathie Hoops, Kai Railsback V, PA-C  azithromycin (ZITHROMAX) 250 MG tablet Take 1 tablet (250 mg total) by mouth daily. Take first 2 tablets together, then 1 every day until finished. 04/19/18   Cathie Hoops, Nautika Cressey V, PA-C  hydrOXYzine (ATARAX/VISTARIL) 25 MG tablet Take 25 mg by mouth 3 (three) times daily as needed.    [provider]  ipratropium (ATROVENT) 0.06 % nasal spray Place 2 sprays into both nostrils 4 (four) times daily. 04/19/18   Cathie Hoops, Dashawna Delbridge V, PA-C  predniSONE (DELTASONE) 20 MG tablet Take 2 tablets (40 mg total) by mouth daily for 5 days. 04/19/18 04/24/18  Cathie Hoops, Hameed Kolar V, PA-C  triamcinolone (NASACORT) 55 MCG/ACT AERO nasal inhaler Place 2 sprays into the nose daily. 04/19/18   Belinda Fisher, PA-C    Family History Family History  Problem Relation Age of Onset  . Hypertension Mother   . Diabetes Mother   . Heart disease Mother 73       failure  . Heart disease Maternal Grandfather   . Diabetes Paternal Grandmother   . Hypertension Paternal Grandmother   . Cancer Paternal Grandfather        lung  . Hearing loss Neg Hx     Social History Social History   Tobacco Use  . Smoking status: Heavy Tobacco Smoker    Packs/day:  1.00    Years: 10.00    Pack years: 10.00    Types: Cigarettes  . Smokeless tobacco: Never Used  Substance Use Topics  . Alcohol use: No  . Drug use: Yes    Types: Marijuana, Methamphetamines     Allergies   Patient has no known allergies.   Review of Systems Review of Systems  Reason unable to perform ROS: See HPI as above.     Physical Exam Triage Vital Signs ED Triage Vitals  Enc Vitals Group     BP 04/19/18 1332 113/71     Pulse Rate 04/19/18 1332 90     Resp 04/19/18 1332 18     Temp 04/19/18 1332 97.7 F (36.5 C)     Temp Source 04/19/18 1332 Oral     SpO2 04/19/18 1332 96 %     Weight --      Height --      Head Circumference --      Peak Flow --      Pain Score 04/19/18 1330 6     Pain Loc --      Pain Edu? --      Excl. in GC? --    No data found.  Updated Vital Signs BP 113/71 (BP Location: Left Arm)   Pulse 90   Temp 97.7 F (36.5 C) (Oral)   Resp 18   LMP 04/05/2018   SpO2 96%   Physical Exam  Constitutional: She is oriented to person, place, and time. She appears well-developed and well-nourished. No distress.  HENT:  Head: Normocephalic and atraumatic.  Right Ear: Tympanic membrane, external ear and ear canal normal. Tympanic membrane is not erythematous and not bulging.  Left Ear: External ear and ear canal normal. Tympanic membrane is erythematous. Tympanic membrane is not bulging.  Nose: Rhinorrhea present. Right sinus exhibits no maxillary sinus tenderness and no frontal sinus tenderness. Left sinus exhibits no maxillary sinus tenderness and no frontal sinus tenderness.  Mouth/Throat: Uvula is midline, oropharynx is clear and moist and mucous membranes are normal.  Eyes: Pupils are equal, round, and reactive to light. Conjunctivae are normal.  Neck: Normal range of motion. Neck supple.  Cardiovascular: Normal rate, regular rhythm and normal heart sounds. Exam reveals no gallop and no friction rub.  No murmur heard. Pulmonary/Chest:  Effort normal. No accessory muscle usage. No respiratory distress.  Inspiratory and expiratory wheezing in the periphery.  Diffuse rhonchi that resolved with cough.  Lymphadenopathy:    She  has no cervical adenopathy.  Neurological: She is alert and oriented to person, place, and time.  Skin: Skin is warm and dry. She is not diaphoretic.  Psychiatric: She has a normal mood and affect. Her behavior is normal. Judgment normal.     UC Treatments / Results  Labs (all labs ordered are listed, but only abnormal results are displayed) Labs Reviewed - No data to display  EKG None  Radiology No results found.  Procedures Procedures (including critical care time)  Medications Ordered in UC Medications  ipratropium-albuterol (DUONEB) 0.5-2.5 (3) MG/3ML nebulizer solution 3 mL (3 mLs Nebulization Given 04/19/18 1409)    Initial Impression / Assessment and Plan / UC Course  I have reviewed the triage vital signs and the nursing notes.  Pertinent labs & imaging results that were available during my care of the patient were reviewed by me and considered in my medical decision making (see chart for details).    Prednisone azithromycin for bronchitis/asthma exacerbation.  Will provide DuoNeb treatment prior to discharge. Other symptomatic treatment discussed.  Return precautions given.  Patient expresses understanding and agrees to plan.  Final Clinical Impressions(s) / UC Diagnoses   Final diagnoses:  Bronchitis     Discharge Instructions     Prednisone and azithromycin for bronchitis/asthma exacerbation. Albuterol as needed. Start nasacort, atrovent nasal spray for nasal congestion/drainage. You can use over the counter nasal saline rinse such as neti pot for nasal congestion. Keep hydrated, your urine should be clear to pale yellow in color. Tylenol/motrin for fever and pain. Monitor for any worsening of symptoms, chest pain, shortness of breath, wheezing, swelling of the throat,  follow up for reevaluation.      ED Prescriptions    Medication Sig Dispense Auth. Provider   azithromycin (ZITHROMAX) 250 MG tablet Take 1 tablet (250 mg total) by mouth daily. Take first 2 tablets together, then 1 every day until finished. 6 tablet Dorrance Sellick V, PA-C   predniSONE (DELTASONE) 20 MG tablet Take 2 tablets (40 mg total) by mouth daily for 5 days. 10 tablet Shannelle Alguire V, PA-C   ipratropium (ATROVENT) 0.06 % nasal spray Place 2 sprays into both nostrils 4 (four) times daily. 15 mL Brigitt Mcclish V, PA-C   triamcinolone (NASACORT) 55 MCG/ACT AERO nasal inhaler Place 2 sprays into the nose daily. 1 Inhaler Daquisha Clermont V, PA-C   albuterol (PROVENTIL HFA;VENTOLIN HFA) 108 (90 Base) MCG/ACT inhaler Inhale 1-2 puffs into the lungs every 6 (six) hours as needed for wheezing or shortness of breath. 1 Inhaler Threasa Alpha, [place] 04/19/18 1441

## 2018-05-01 ENCOUNTER — Encounter (HOSPITAL_COMMUNITY): Payer: Self-pay | Admitting: Emergency Medicine

## 2018-05-01 ENCOUNTER — Emergency Department (HOSPITAL_COMMUNITY)
Admission: EM | Admit: 2018-05-01 | Discharge: 2018-05-02 | Disposition: A | Discharge status: Home / Self Care | Payer: Medicaid Other | Attending: Emergency Medicine | Admitting: Emergency Medicine

## 2018-05-01 ENCOUNTER — Emergency Department (HOSPITAL_COMMUNITY): Payer: Medicaid Other

## 2018-05-01 DIAGNOSIS — R0602 Shortness of breath: Secondary | ICD-10-CM | POA: Diagnosis present

## 2018-05-01 DIAGNOSIS — J189 Pneumonia, unspecified organism: Secondary | ICD-10-CM | POA: Diagnosis not present

## 2018-05-01 DIAGNOSIS — J45909 Unspecified asthma, uncomplicated: Secondary | ICD-10-CM | POA: Insufficient documentation

## 2018-05-01 DIAGNOSIS — F17210 Nicotine dependence, cigarettes, uncomplicated: Secondary | ICD-10-CM | POA: Insufficient documentation

## 2018-05-01 LAB — CBC WITH DIFFERENTIAL/PLATELET
ABS IMMATURE GRANULOCYTES: 0.1 10*3/uL (ref 0.0–0.1)
Basophils Absolute: 0 10*3/uL (ref 0.0–0.1)
Basophils Relative: 0 %
Eosinophils Absolute: 0.3 10*3/uL (ref 0.0–0.7)
Eosinophils Relative: 2 %
HEMATOCRIT: 37.6 % (ref 36.0–46.0)
HEMOGLOBIN: 12.3 g/dL (ref 12.0–15.0)
Immature Granulocytes: 1 %
LYMPHS ABS: 2.7 10*3/uL (ref 0.7–4.0)
LYMPHS PCT: 20 %
MCH: 29.2 pg (ref 26.0–34.0)
MCHC: 32.7 g/dL (ref 30.0–36.0)
MCV: 89.3 fL (ref 78.0–100.0)
MONOS PCT: 4 %
Monocytes Absolute: 0.6 10*3/uL (ref 0.1–1.0)
NEUTROS ABS: 10.1 10*3/uL — AB (ref 1.7–7.7)
NEUTROS PCT: 73 %
Platelets: 318 10*3/uL (ref 150–400)
RBC: 4.21 MIL/uL (ref 3.87–5.11)
RDW: 13.2 % (ref 11.5–15.5)
WBC: 13.8 10*3/uL — AB (ref 4.0–10.5)

## 2018-05-01 LAB — BASIC METABOLIC PANEL
Anion gap: 8 (ref 5–15)
BUN: 5 mg/dL — ABNORMAL LOW (ref 6–20)
CALCIUM: 9.3 mg/dL (ref 8.9–10.3)
CO2: 24 mmol/L (ref 22–32)
Chloride: 103 mmol/L (ref 101–111)
Creatinine, Ser: 0.78 mg/dL (ref 0.44–1.00)
Glucose, Bld: 95 mg/dL (ref 65–99)
Potassium: 3.8 mmol/L (ref 3.5–5.1)
Sodium: 135 mmol/L (ref 135–145)

## 2018-05-01 LAB — I-STAT BETA HCG BLOOD, ED (MC, WL, AP ONLY)

## 2018-05-01 MED ORDER — ALBUTEROL SULFATE (2.5 MG/3ML) 0.083% IN NEBU
5.0000 mg | INHALATION_SOLUTION | Freq: Once | RESPIRATORY_TRACT | Status: AC
  Administered 2018-05-01: 5 mg via RESPIRATORY_TRACT
  Filled 2018-05-01: qty 6

## 2018-05-01 NOTE — ED Triage Notes (Signed)
Pt presents with cough for several weeks, pt seen at [facility] for same, pt completed antbx and steroids, pt reports productive cough with body aches.

## 2018-05-01 NOTE — ED Notes (Signed)
No answer in WR

## 2018-05-02 LAB — D-DIMER, QUANTITATIVE: D-Dimer, Quant: 0.48 ug/mL-FEU (ref 0.00–0.50)

## 2018-05-02 MED ORDER — KETOROLAC TROMETHAMINE 15 MG/ML IJ SOLN
15.0000 mg | Freq: Once | INTRAMUSCULAR | Status: AC
  Administered 2018-05-02: 15 mg via INTRAVENOUS
  Filled 2018-05-02: qty 1

## 2018-05-02 MED ORDER — DOXYCYCLINE HYCLATE 100 MG PO CAPS: 100.0000 mg | ORAL_CAPSULE | Freq: Two times a day (BID) | ORAL | 0 refills | Status: DC

## 2018-05-02 MED ORDER — PREDNISONE 50 MG PO TABS: ORAL_TABLET | ORAL | 0 refills | Status: DC

## 2018-05-02 MED ORDER — BENZONATATE 100 MG PO CAPS
200.0000 mg | ORAL_CAPSULE | Freq: Once | ORAL | Status: AC
  Administered 2018-05-02: 200 mg via ORAL
  Filled 2018-05-02: qty 2

## 2018-05-02 MED ORDER — DOXYCYCLINE HYCLATE 100 MG PO TABS
100.0000 mg | ORAL_TABLET | Freq: Once | ORAL | Status: AC
  Administered 2018-05-02: 100 mg via ORAL
  Filled 2018-05-02: qty 1

## 2018-05-02 MED ORDER — ALBUTEROL SULFATE HFA 108 (90 BASE) MCG/ACT IN AERS
1.0000 | INHALATION_SPRAY | Freq: Once | RESPIRATORY_TRACT | Status: AC
  Administered 2018-05-02: 2 via RESPIRATORY_TRACT
  Filled 2018-05-02: qty 6.7

## 2018-05-02 MED ORDER — BENZONATATE 100 MG PO CAPS: 100.0000 mg | ORAL_CAPSULE | Freq: Three times a day (TID) | ORAL | 0 refills | Status: DC | PRN

## 2018-05-02 NOTE — Discharge Instructions (Addendum)
He was seen in the emergency department and found to have pneumonia.  We suspect this to be the cause of your symptoms.  With this being said we are starting you on doxycycline, and antibiotic, to treat the infection.  Take this once in the morning and once in the evening.  Be sure to take this with food as it can cause nausea, we additionally refilled your inhaler, please use this 1 to 2 puffs as needed for wheezing and shortness of breath every 6 hours.  We are placing you on prednisone for 5 days to help with inflammation in your lungs and to help with wheezing.  We are also sending you home with Tessalon, this is a cough medicine, you may take this every 8 hours as needed for coughing   We have prescribed you new medication(s) today. Discuss the medications prescribed today with your pharmacist as they can have adverse effects and interactions with your other medicines including over the counter and prescribed medications. Seek medical evaluation if you start to experience new or abnormal symptoms after taking one of these medicines, seek care immediately if you start to experience difficulty breathing, feeling of your throat closing, facial swelling, or rash as these could be indications of a more serious allergic reaction.   It is extremely important that you follow-up with your primary care provider in 2 to 3 days for reevaluation of your symptoms.  Return to the ER for new or worsening symptoms including but not limited to worsening discomfort, trouble breathing, fever not improved with Motrin/Tylenol, inability to keep fluids down, or any other concerns that you may have.  Additionally avoid tobacco products as these may worsen your condition.

## 2018-05-02 NOTE — ED Provider Notes (Signed)
[facility] EMERGENCY DEPARTMENT Provider Note   CSN: [id number] Arrival date & time: 05/01/18  1940     History   Chief Complaint Chief Complaint  Patient presents with  . Shortness of Breath   HPI Betty Gomez is a 32 y.o. female with a history of tobacco abuse, asthma, anxiety, and depression who presents to the emergency department with complaints of dyspnea for the past 2 to 3 weeks. Patient states that towards the beginning of the month she was having URI symptoms including congestion, rhinorrhea, ear pain, sore throat, dry cough, wheezing, and difficulty breathing.  She was seen at urgent care 04/19/2018-was suspected to have asthma/bronchitis-she was given prednisone, azithromycin, and albuterol inhaler. No CXR performed- was not diagnosed with pneumonia at that time.  She states that she completed her antibiotic and her steroids.  She states that she has been utilizing the inhaler.  These medications did seem to help somewhat, however her cough, dyspnea, wheezing, and congestion have persisted and now seem somewhat worse.  She is also having upper back and chest discomfort which is worse with coughing or with a deep breath. Has had subjective fever as well. Denies leg pain/swelling, hemoptysis, recent surgery/trauma, recent long travel, hormone use, personal hx of cancer, or hx of DVT/PE.     HPI  Past Medical History:  Diagnosis Date  . Anxiety   . Asthma   . Depression    ok, came off meds, has someone she sees for this.  . Endometriosis   . Infection    UTI    Patient Active Problem List   Diagnosis Date Noted  . 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 Benzodiazepine abuse (HCC) 09/21/2017  . Normal labor 09/20/2016  . Labor and delivery, indication for care 09/08/2016  . Decreased fetal movement 07/24/2016  . Pregnancy 09/15/2015  . Insufficient prenatal care 09/15/2015  . Indication for care in labor  or delivery 09/10/2015  . Braxton Hicks contractions 09/08/2015  . Candida albicans infection 08/16/2015  . Abnormal antenatal AFP screen 05/24/2015  . Irregular contractions 05/24/2015    Past Surgical History:  Procedure Laterality Date  . DIAGNOSTIC LAPAROSCOPY     endometriosis  . TUBAL LIGATION N/A 09/17/2015   Procedure: POST PARTUM TUBAL LIGATION;  Surgeon: Vena Austria, MD;  Location: [facility] ORS;  Service: Gynecology;  Laterality: N/A;  . TUBAL LIGATION Bilateral 09/21/2016   Procedure: POST PARTUM TUBAL LIGATION;  Surgeon: Nadara Mustard, MD;  Location: [facility] ORS;  Service: Gynecology;  Laterality: Bilateral;     OB History    Gravida  4   Para  4   Term  4   Preterm  0   AB  0   Living  4     SAB  0   TAB  0   Ectopic  0   Multiple  0   Live Births  4        Obstetric Comments  Pt states last infant contracted GBS even though pt treated in labor. Pt states infant caught it anyway- treated successfully. IF positive for GBS at 36 weeks this pregnancy - pt says plan is for C/S. 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, grtowing well         Home Medications    Prior to Admission medications   Medication Sig Start Date End Date Taking? Authorizing Provider  albuterol (PROVENTIL HFA;VENTOLIN HFA) 108 (90 Base) MCG/ACT inhaler Inhale  1-2 puffs into the lungs every 6 (six) hours as needed for wheezing or shortness of breath. 04/19/18  Yes Belinda Fisher, PA-C    Family History Family History  Problem Relation Age of Onset  . Hypertension Mother   . Diabetes Mother   . Heart disease Mother 28       failure  . Heart disease Maternal Grandfather   . Diabetes Paternal Grandmother   . Hypertension Paternal Grandmother   . Cancer Paternal Grandfather        lung  . Hearing loss Neg Hx     Social History Social History   Tobacco Use  . Smoking status: Heavy Tobacco Smoker    Packs/day: 1.00    Years: 10.00    Pack years: 10.00    Types: Cigarettes    . Smokeless tobacco: Never Used  Substance Use Topics  . Alcohol use: No  . Drug use: Not Currently    Types: Marijuana, Methamphetamines     Allergies   Patient has no known allergies.   Review of Systems Review of Systems  Constitutional: Positive for fever (subjective).  HENT: Positive for congestion. Negative for ear pain.   Respiratory: Positive for cough, shortness of breath and wheezing.   Cardiovascular: Positive for chest pain. Negative for leg swelling.  Gastrointestinal: Negative for diarrhea, nausea and vomiting.  Neurological: Negative for dizziness, syncope and weakness.  All other systems reviewed and are negative.  Physical Exam Updated Vital Signs BP 113/79 (BP Location: Right Arm)   Pulse (!) 109   Temp 98.1 F (36.7 C) (Oral)   Resp 18   Ht  (1.651 m)   Wt 74.8 kg (165 lb)   LMP 04/05/2018   SpO2 96%   BMI 27.46 kg/m   Physical Exam  Constitutional: She appears well-developed and well-nourished.  Non-toxic appearance. No distress.  HENT:  Head: Normocephalic and atraumatic.  Right Ear: No mastoid tenderness. Tympanic membrane is not perforated, not erythematous, not retracted and not bulging.  Left Ear: No mastoid tenderness. Tympanic membrane is not perforated, not erythematous, not retracted and not bulging.  Nose: Mucosal edema present. Right sinus exhibits no maxillary sinus tenderness and no frontal sinus tenderness. Left sinus exhibits no maxillary sinus tenderness and no frontal sinus tenderness.  Mouth/Throat: Uvula is midline and oropharynx is clear and moist. No oropharyngeal exudate or posterior oropharyngeal erythema.  Eyes: Pupils are equal, round, and reactive to light. Conjunctivae are normal. Right eye exhibits no discharge. Left eye exhibits no discharge.  Neck: Normal range of motion. Neck supple.  Cardiovascular: Normal rate and regular rhythm.  No murmur heard. Pulmonary/Chest: Effort normal. No respiratory distress. She  has decreased breath sounds (bibasilar). She has no wheezes. She has no rhonchi. She has no rales.  Abdominal: Soft. She exhibits no distension. There is no tenderness.  Musculoskeletal:       Right lower leg: She exhibits no tenderness and no edema.       Left lower leg: She exhibits no tenderness and no edema.  Lymphadenopathy:    She has no cervical adenopathy.  Neurological: She is alert.  Skin: Skin is warm and dry. No rash noted.  Psychiatric: She has a normal mood and affect. Her behavior is normal.  Nursing note and vitals reviewed.   ED Treatments / Results  Labs Results for orders placed or performed during the hospital encounter of 05/01/18  CBC with Differential  Result Value Ref Range   WBC 13.8 (H)  4.0 - 10.5 K/uL   RBC 4.21 3.87 - 5.11 MIL/uL   Hemoglobin 12.3 12.0 - 15.0 g/dL   HCT 46.9 62.9 - 52.8 %   MCV 89.3 78.0 - 100.0 fL   MCH 29.2 26.0 - 34.0 pg   MCHC 32.7 30.0 - 36.0 g/dL   RDW 41.3 24.4 - 01.0 %   Platelets 318 150 - 400 K/uL   Neutrophils Relative % 73 %   Neutro Abs 10.1 (H) 1.7 - 7.7 K/uL   Lymphocytes Relative 20 %   Lymphs Abs 2.7 0.7 - 4.0 K/uL   Monocytes Relative 4 %   Monocytes Absolute 0.6 0.1 - 1.0 K/uL   Eosinophils Relative 2 %   Eosinophils Absolute 0.3 0.0 - 0.7 K/uL   Basophils Relative 0 %   Basophils Absolute 0.0 0.0 - 0.1 K/uL   Immature Granulocytes 1 %   Abs Immature Granulocytes 0.1 0.0 - 0.1 K/uL  Basic metabolic panel  Result Value Ref Range   Sodium 135 135 - 145 mmol/L   Potassium 3.8 3.5 - 5.1 mmol/L   Chloride 103 101 - 111 mmol/L   CO2 24 22 - 32 mmol/L   Glucose, Bld 95 65 - 99 mg/dL   BUN 5 (L) 6 - 20 mg/dL   Creatinine, Ser 2.72 0.44 - 1.00 mg/dL   Calcium 9.3 8.9 - 53.6 mg/dL   GFR calc non Af Amer >60 >60 mL/min   GFR calc Af Amer >60 >60 mL/min   Anion gap 8 5 - 15  D-dimer, quantitative (not at [facility])  Result Value Ref Range   D-Dimer, Quant 0.48 0.00 - 0.50 ug/mL-FEU  I-Stat Beta hCG blood, ED ([facility],  [facility], [facility] only)  Result Value Ref Range   I-stat hCG, quantitative <5.0 <5 mIU/mL   Comment 3           EKG EKG Interpretation  Date/Time:  Monday May 01 2018 20:21:50 EDT Ventricular Rate:  96 PR Interval:  124 QRS Duration: 88 QT Interval:  360 QTC Calculation: 454 R Axis:   65 Text Interpretation:  Normal sinus rhythm Normal ECG When compared with ECG of 01/03/2010, No significant change was found Confirmed by Dione Booze ([id number]) on 05/01/2018 10:55:51 PM   Radiology Dg Chest 2 View  Result Date: 05/01/2018 CLINICAL DATA:  Subacute onset of cough and body aches. EXAM: CHEST - 2 VIEW COMPARISON:  Chest radiograph performed 01/03/2010 FINDINGS: The lungs are well-aerated. Mild bibasilar airspace opacities may reflect pneumonia. There is no evidence of pleural effusion or pneumothorax. The heart is normal in size; the mediastinal contour is within normal limits. No acute osseous abnormalities are seen. IMPRESSION: Mild bibasilar airspace opacities may reflect pneumonia. Electronically Signed   By: Roanna Raider M.D.   On: 05/01/2018 20:53    Procedures Procedures (including critical care time)  Medications Ordered in ED Medications  albuterol (PROVENTIL) (2.5 MG/3ML) 0.083% nebulizer solution 5 mg (5 mg Nebulization Given 05/01/18 2244)  ketorolac (TORADOL) 15 MG/ML injection 15 mg (15 mg Intravenous Given 05/02/18 0106)  albuterol (PROVENTIL HFA;VENTOLIN HFA) 108 (90 Base) MCG/ACT inhaler 1-2 puff (2 puffs Inhalation Given 05/02/18 0113)  doxycycline (VIBRA-TABS) tablet 100 mg (100 mg Oral Given 05/02/18 0113)  benzonatate (TESSALON) capsule 200 mg (200 mg Oral Given 05/02/18 0112)    Initial Impression / Assessment and Plan / ED Course  I have reviewed the triage vital signs and the nursing notes.  Pertinent labs & imaging results that were available during  my care of the patient were reviewed by me and considered in my medical decision making (see chart for details).  Patient  presents with dyspnea and URI sxs. Patient nontoxic appearing, in no apparent distress, initial vitals WNL. Triage work-up reviewed: labs notable for leukocytosis at 13.8 with left shift, otherwise grossly unremarkable. EKG with NSR. CXR with findings of mild bibasilar airspace opacities may reflect pneumonia. Patient fairly well appearing on my exam, no respiratory distress, she does have bibasilar decreased breath sounds. She has no wheezing on my exam, RT at bedside has given 1 DuoNeb, did not appreciate wheezing prior to initiation of DuoNeb, patient states she has felt she is wheezing and feels the breathing treatment has helped her. Suspect pneumonia with asthma exacerbation component, however given dyspnea with pleuritic type discomfort considering pulmonary embolism. Patient is low risk wells therefore d-dimer was obtained and negative, doubt pulmonary embolism at this time. I personally ambulated the patient- ambulatory SpO2 >95% with HR remaining in the 90s. Will start patient on Doxycycline for pneumonia and  short course of steroids for asthma exacerbation. Smoking cessation counseling performed. Tessalon provided at patient's request for anti-tussive medication. I discussed results, treatment plan, need for PCP follow-up, and return precautions with the patient. Provided opportunity for questions, patient confirmed understanding and is in agreement with plan.   Final Clinical Impressions(s) / ED Diagnoses   Final diagnoses:  Community acquired pneumonia, unspecified laterality    ED Discharge Orders        Ordered    benzonatate (TESSALON) 100 MG capsule  3 times daily PRN     05/02/18 0107    doxycycline (VIBRAMYCIN) 100 MG capsule  2 times daily     05/02/18 0107    predniSONE (DELTASONE) 50 MG tablet     05/02/18 0107       Abrahim Sargent, Pleas Koch, PA-C 05/02/18 0139    Gwyneth Sprout, MD 05/02/18 2247

## 2018-05-19 ENCOUNTER — Ambulatory Visit (HOSPITAL_COMMUNITY)
Admission: EM | Admit: 2018-05-19 | Discharge: 2018-05-19 | Disposition: A | Discharge status: Home / Self Care | Payer: Medicaid Other | Attending: Family Medicine | Admitting: Family Medicine

## 2018-05-19 ENCOUNTER — Encounter (HOSPITAL_COMMUNITY): Payer: Self-pay | Admitting: Family Medicine

## 2018-05-19 DIAGNOSIS — M62830 Muscle spasm of back: Secondary | ICD-10-CM | POA: Diagnosis not present

## 2018-05-19 MED ORDER — IBUPROFEN 800 MG PO TABS: 800.0000 mg | ORAL_TABLET | Freq: Three times a day (TID) | ORAL | 0 refills | Status: DC

## 2018-05-19 MED ORDER — CYCLOBENZAPRINE HCL 10 MG PO TABS: 10.0000 mg | ORAL_TABLET | Freq: Two times a day (BID) | ORAL | 0 refills | Status: DC | PRN

## 2018-05-19 MED ORDER — KETOROLAC TROMETHAMINE 60 MG/2ML IM SOLN
INTRAMUSCULAR | Status: AC
  Filled 2018-05-19: qty 2

## 2018-05-19 MED ORDER — KETOROLAC TROMETHAMINE 60 MG/2ML IM SOLN
60.0000 mg | Freq: Once | INTRAMUSCULAR | Status: AC
  Administered 2018-05-19: 60 mg via INTRAMUSCULAR

## 2018-05-19 MED ORDER — PREDNISONE 50 MG PO TABS: 50.0000 mg | ORAL_TABLET | Freq: Every day | ORAL | 0 refills | Status: AC

## 2018-05-19 NOTE — ED Triage Notes (Signed)
Pt here for mid back pain x 2 days. She has been using heat, aleve, and chiropractor without relief. Denies any injury.

## 2018-05-19 NOTE — ED Provider Notes (Signed)
[facility]    CSN: [id number] Arrival date & time: 05/19/18  1030     History   Chief Complaint Chief Complaint  Patient presents with  . Back Pain    HPI Betty Gomez is a 32 y.o. female history of anxiety, asthma presenting today for evaluation of back pain.  Patient states that she frequently does with back spasms off and on.  She has previously had x-rays that have been negative.  She typically takes Flexeril and tramadol from her PCP and states that she is out of these.  Her most recent symptoms have been going on for approximately 2 days.  She has been using heating pad as well as trying aspirin, Aleve and Tylenol without relief.  Denies numbness or tingling.  Denies radiation to legs.  Denies loss of bowel or bladder control.  Denies saddle anesthesia.  Denies any new injury or any increase in activity recently.  HPI  Past Medical History:  Diagnosis Date  . Anxiety   . Asthma   . Depression    ok, came off meds, has someone she sees for this.  . Endometriosis   . Infection    UTI    Patient Active Problem List   Diagnosis Date Noted  . 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 Benzodiazepine abuse (HCC) 09/21/2017  . Normal labor 09/20/2016  . Labor and delivery, indication for care 09/08/2016  . Decreased fetal movement 07/24/2016  . Pregnancy 09/15/2015  . Insufficient prenatal care 09/15/2015  . Indication for care in labor or delivery 09/10/2015  . Braxton Hicks contractions 09/08/2015  . Candida albicans infection 08/16/2015  . Abnormal antenatal AFP screen 05/24/2015  . Irregular contractions 05/24/2015    Past Surgical History:  Procedure Laterality Date  . DIAGNOSTIC LAPAROSCOPY     endometriosis  . TUBAL LIGATION N/A 09/17/2015   Procedure: POST PARTUM TUBAL LIGATION;  Surgeon: Vena Austria, MD;  Location: [facility] ORS;  Service: Gynecology;  Laterality: N/A;  . TUBAL LIGATION Bilateral  09/21/2016   Procedure: POST PARTUM TUBAL LIGATION;  Surgeon: Nadara Mustard, MD;  Location: [facility] ORS;  Service: Gynecology;  Laterality: Bilateral;    OB History    Gravida  4   Para  4   Term  4   Preterm  0   AB  0   Living  4     SAB  0   TAB  0   Ectopic  0   Multiple  0   Live Births  4        Obstetric Comments  Pt states last infant contracted GBS even though pt treated in labor. Pt states infant caught it anyway- treated successfully. IF positive for GBS at 36 weeks this pregnancy - pt says plan is for C/S. 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, grtowing well         Home Medications    Prior to Admission medications   Medication Sig Start Date End Date Taking? Authorizing Provider  albuterol (PROVENTIL HFA;VENTOLIN HFA) 108 (90 Base) MCG/ACT inhaler Inhale 1-2 puffs into the lungs every 6 (six) hours as needed for wheezing or shortness of breath. 04/19/18   Cathie Hoops, Amy V, PA-C  cyclobenzaprine (FLEXERIL) 10 MG tablet Take 1 tablet (10 mg total) by mouth 2 (two) times daily as needed for muscle spasms. 05/19/18   Ulus Hazen C, PA-C  ibuprofen (ADVIL,MOTRIN) 800 MG tablet Take 1 tablet (800 mg  total) by mouth 3 (three) times daily. 05/19/18   Moo Gravley C, PA-C  predniSONE (DELTASONE) 50 MG tablet Take 1 tablet (50 mg total) by mouth daily for 5 days. 05/19/18 05/24/18  Shunte Senseney, Junius Creamer, PA-C    Family History Family History  Problem Relation Age of Onset  . Hypertension Mother   . Diabetes Mother   . Heart disease Mother 4       failure  . Heart disease Maternal Grandfather   . Diabetes Paternal Grandmother   . Hypertension Paternal Grandmother   . Cancer Paternal Grandfather        lung  . Hearing loss Neg Hx     Social History Social History   Tobacco Use  . Smoking status: Heavy Tobacco Smoker    Packs/day: 1.00    Years: 10.00    Pack years: 10.00    Types: Cigarettes  . Smokeless tobacco: Never Used  Substance Use Topics  .  Alcohol use: No  . Drug use: Not Currently    Types: Marijuana, Methamphetamines     Allergies   Patient has no known allergies.   Review of Systems Review of Systems  Constitutional: Negative for activity change and appetite change.  HENT: Negative for trouble swallowing.   Eyes: Negative for pain and visual disturbance.  Respiratory: Negative for shortness of breath.   Cardiovascular: Negative for chest pain.  Gastrointestinal: Negative for abdominal pain, nausea and vomiting.  Musculoskeletal: Positive for back pain, gait problem, myalgias and neck pain. Negative for arthralgias and neck stiffness.  Skin: Negative for color change and wound.  Neurological: Negative for dizziness, seizures, syncope, weakness, light-headedness, numbness and headaches.     Physical Exam Triage Vital Signs ED Triage Vitals [05/19/18 1055]  Enc Vitals Group     BP 120/69     Pulse Rate 84     Resp 18     Temp 98 F (36.7 C)     Temp src      SpO2 99 %     Weight      Height      Head Circumference      Peak Flow      Pain Score 10     Pain Loc      Pain Edu?      Excl. in GC?    No data found.  Updated Vital Signs BP 120/69   Pulse 84   Temp 98 F (36.7 C)   Resp 18   LMP 05/05/2018   SpO2 99%   Visual Acuity Right Eye Distance:   Left Eye Distance:   Bilateral Distance:    Right Eye Near:   Left Eye Near:    Bilateral Near:     Physical Exam  Constitutional: She appears well-developed and well-nourished. No distress.  HENT:  Head: Normocephalic and atraumatic.  Eyes: Conjunctivae are normal.  Neck: Neck supple.  Cardiovascular: Normal rate and regular rhythm.  No murmur heard. Pulmonary/Chest: Effort normal and breath sounds normal. No respiratory distress.  Abdominal: Soft. There is no tenderness.  Musculoskeletal: She exhibits no edema.  Tenderness to palpation of lower thoracic spine midline as well as thoracic paraspinal musculature bilaterally.  Negative  straight leg raise.  Neurological: She is alert.  Skin: Skin is warm and dry.  Psychiatric: She has a normal mood and affect.  Nursing note and vitals reviewed.    UC Treatments / Results  Labs (all labs ordered are listed, but only abnormal results are  displayed) Labs Reviewed - No data to display  EKG None  Radiology No results found.  Procedures Procedures (including critical care time)  Medications Ordered in UC Medications  ketorolac (TORADOL) injection 60 mg (has no administration in time range)    Initial Impression / Assessment and Plan / UC Course  I have reviewed the triage vital signs and the nursing notes.  Pertinent labs & imaging results that were available during my care of the patient were reviewed by me and considered in my medical decision making (see chart for details).    Back pain likely muscular. Provided Toradol in clinic today.  We will send home with ibuprofen, Flexeril as well as a short course of prednisone.  No red flags.Discussed strict return precautions. Patient verbalized understanding and is agreeable with plan.  Final Clinical Impressions(s) / UC Diagnoses   Final diagnoses:  Muscle spasm of back     Discharge Instructions     Use anti-inflammatories for pain/swelling. You may take up to 800 mg Ibuprofen every 8 hours with food. You may supplement Ibuprofen with Tylenol [phone] mg every 8 hours.   You may use flexeril as needed to help with pain. This is a muscle relaxer and causes sedation- please use only at bedtime or when you will be home and not have to drive/work  Please also take prednisone daily for the next 5 days.  Please return if symptoms not improving, worsening, develop numbness or tingling.  Or loss of bowel or bladder control.   ED Prescriptions    Medication Sig Dispense Auth. Provider   cyclobenzaprine (FLEXERIL) 10 MG tablet Take 1 tablet (10 mg total) by mouth 2 (two) times daily as needed for muscle  spasms. 30 tablet Mearl Olver C, PA-C   ibuprofen (ADVIL,MOTRIN) 800 MG tablet Take 1 tablet (800 mg total) by mouth 3 (three) times daily. 21 tablet Bonni Neuser C, PA-C   predniSONE (DELTASONE) 50 MG tablet Take 1 tablet (50 mg total) by mouth daily for 5 days. 5 tablet Geri Hepler C, PA-C     Controlled Substance Prescriptions [place] Controlled Substance Registry consulted? Not Applicable   Lew DawesWieters, Ryson Bacha C, [place]PA-C 05/19/18 1130

## 2018-05-19 NOTE — Discharge Instructions (Signed)
Use anti-inflammatories for pain/swelling. You may take up to 800 mg Ibuprofen every 8 hours with food. You may supplement Ibuprofen with Tylenol [phone] mg every 8 hours.   You may use flexeril as needed to help with pain. This is a muscle relaxer and causes sedation- please use only at bedtime or when you will be home and not have to drive/work  Please also take prednisone daily for the next 5 days.  Please return if symptoms not improving, worsening, develop numbness or tingling.  Or loss of bowel or bladder control.

## 2018-05-31 ENCOUNTER — Ambulatory Visit (HOSPITAL_COMMUNITY)
Admission: EM | Admit: 2018-05-31 | Discharge: 2018-05-31 | Disposition: A | Discharge status: Home / Self Care | Payer: Medicaid Other | Attending: Family Medicine | Admitting: Family Medicine

## 2018-05-31 ENCOUNTER — Encounter (HOSPITAL_COMMUNITY): Payer: Self-pay

## 2018-05-31 DIAGNOSIS — M62830 Muscle spasm of back: Secondary | ICD-10-CM

## 2018-05-31 MED ORDER — METHOCARBAMOL 500 MG PO TABS: 500.0000 mg | ORAL_TABLET | Freq: Two times a day (BID) | ORAL | 0 refills | Status: DC

## 2018-05-31 MED ORDER — TRAMADOL HCL 50 MG PO TABS: 50.0000 mg | ORAL_TABLET | Freq: Four times a day (QID) | ORAL | 0 refills | Status: DC | PRN

## 2018-05-31 NOTE — ED Triage Notes (Signed)
Pt presents with complaints of having continued back pain and being out of her Flexeril.

## 2018-05-31 NOTE — Discharge Instructions (Signed)
Be aware, pain medications may cause drowsiness. Please do not drive, operate heavy machinery or make important decisions while on this medication, it can cloud your judgement.  

## 2018-05-31 NOTE — ED Provider Notes (Signed)
[facility]   [id number] 05/31/18 Arrival Time: 1301  ASSESSMENT & PLAN:  1. Muscle spasm of back    Meds ordered this encounter  Medications  . methocarbamol (ROBAXIN) 500 MG tablet    Sig: Take 1 tablet (500 mg total) by mouth 2 (two) times daily.    Dispense:  20 tablet    Refill:  0  . traMADol (ULTRAM) 50 MG tablet    Sig: Take 1 tablet (50 mg total) by mouth every 6 (six) hours as needed.    Dispense:  12 tablet    Refill:  0   Elects trail of different muscle relaxer. Encouraged ROM as tolerated. Medication sedation precautions given. Plans f/u with PCP. May need referral to PT.  Reviewed expectations re: course of current medical issues. Questions answered. Outlined signs and symptoms indicating need for more acute intervention. Patient verbalized understanding. After Visit Summary given.  SUBJECTIVE: History from: patient. Betty Gomez is a 32 y.o. female who was seen here on 6/14. Reports continued muscle spasm of L mid back. On and off. Stiff with sharp exacerbations. No SOB or resp difficulties. Injury/trama: no. Relieved by: 'getting in the right position'. Worsened by: certain movements. Associated symptoms: none reported. Extremity sensation changes or weakness: none. Self treatment: Flexeril with mild help. History of similar: no  ROS: As per HPI.   OBJECTIVE:  Vitals:   05/31/18 1343  BP: 129/72  Pulse: (!) 101  Resp: 18  Temp: 98.5 F (36.9 C)  SpO2: 100%    General appearance: alert; no distress Extremities: warm and well perfused; symmetrical with no gross deformities; localized tenderness over her left mid back musculature with no swelling and no bruising; ROM: normal at waist CV: normal extremity capillary refill Skin: warm and dry Neurologic: normal gait; normal symmetric reflexes in all extremities; normal sensation in all extremities Psychological: alert and cooperative; normal mood and affect  No Known  Allergies  Past Medical History:  Diagnosis Date  . Anxiety   . Asthma   . Depression    ok, came off meds, has someone she sees for this.  . Endometriosis   . Infection    UTI   Social History   Socioeconomic History  . Marital status: Married    Spouse name: Not on file  . Number of children: Not on file  . Years of education: Not on file  . Highest education level: Not on file  Occupational History  . Not on file  Social Needs  . Financial resource strain: Not on file  . Food insecurity:    Worry: Not on file    Inability: Not on file  . Transportation needs:    Medical: Not on file    Non-medical: Not on file  Tobacco Use  . Smoking status: Heavy Tobacco Smoker    Packs/day: 1.00    Years: 10.00    Pack years: 10.00    Types: Cigarettes  . Smokeless tobacco: Never Used  Substance and Sexual Activity  . Alcohol use: No  . Drug use: Not Currently    Types: Marijuana, Methamphetamines  . Sexual activity: Yes  Lifestyle  . Physical activity:    Days per week: Not on file    Minutes per session: Not on file  . Stress: Not on file  Relationships  . Social connections:    Talks on phone: Not on file    Gets together: Not on file    Attends religious service: Not on  file    Active member of club or organization: Not on file    Attends meetings of clubs or organizations: Not on file    Relationship status: Not on file  . Intimate partner violence:    Fear of current or ex partner: Not on file    Emotionally abused: Not on file    Physically abused: Not on file    Forced sexual activity: Not on file  Other Topics Concern  . Not on file  Social History Narrative  . Not on file   Family History  Problem Relation Age of Onset  . Hypertension Mother   . Diabetes Mother   . Heart disease Mother 5       failure  . Heart disease Maternal Grandfather   . Diabetes Paternal Grandmother   . Hypertension Paternal Grandmother   . Cancer Paternal Grandfather         lung  . Hearing loss Neg Hx    Past Surgical History:  Procedure Laterality Date  . DIAGNOSTIC LAPAROSCOPY     endometriosis  . TUBAL LIGATION N/A 09/17/2015   Procedure: POST PARTUM TUBAL LIGATION;  Surgeon: Vena Austria, MD;  Location: [facility];  Service: Gynecology;  Laterality: N/A;  . TUBAL LIGATION Bilateral 09/21/2016   Procedure: POST PARTUM TUBAL LIGATION;  Surgeon: Nadara Mustard, MD;  Location: [facility];  Service: Gynecology;  Laterality: Bilateral;      Mardella Layman, MD 05/31/18 1455

## 2018-06-24 ENCOUNTER — Emergency Department (HOSPITAL_COMMUNITY)
Admission: EM | Admit: 2018-06-24 | Discharge: 2018-06-24 | Disposition: A | Discharge status: Home / Self Care | Payer: Medicaid Other | Attending: Emergency Medicine | Admitting: Emergency Medicine

## 2018-06-24 ENCOUNTER — Encounter (HOSPITAL_COMMUNITY): Payer: Self-pay | Admitting: Emergency Medicine

## 2018-06-24 DIAGNOSIS — Z79899 Other long term (current) drug therapy: Secondary | ICD-10-CM | POA: Diagnosis not present

## 2018-06-24 DIAGNOSIS — F10920 Alcohol use, unspecified with intoxication, uncomplicated: Secondary | ICD-10-CM | POA: Diagnosis present

## 2018-06-24 DIAGNOSIS — J45909 Unspecified asthma, uncomplicated: Secondary | ICD-10-CM | POA: Diagnosis not present

## 2018-06-24 DIAGNOSIS — F17210 Nicotine dependence, cigarettes, uncomplicated: Secondary | ICD-10-CM | POA: Insufficient documentation

## 2018-06-24 MED ORDER — SODIUM CHLORIDE 0.9 % IV SOLN: Freq: Once | INTRAVENOUS | Status: DC

## 2018-06-24 MED ORDER — THIAMINE HCL 100 MG/ML IJ SOLN
100.0000 mg | Freq: Once | INTRAMUSCULAR | Status: AC
  Administered 2018-06-24: 100 mg via INTRAVENOUS
  Filled 2018-06-24: qty 2

## 2018-06-24 MED ORDER — SODIUM CHLORIDE 0.9 % IV BOLUS
1000.0000 mL | Freq: Once | INTRAVENOUS | Status: AC
  Administered 2018-06-24: 1000 mL via INTRAVENOUS
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
## 2018-06-24 NOTE — ED Notes (Signed)
Patient refused vital signs. Patient states her ride is outside and is about to leave her.

## 2018-06-24 NOTE — Discharge Instructions (Signed)
Please return for any headaches, changes in your mental status.  Please return if you feel unsafe.
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
## 2018-10-16 ENCOUNTER — Emergency Department
Admission: EM | Admit: 2018-10-16 | Discharge: 2018-10-16 | Disposition: A | Discharge status: Home / Self Care | Payer: Medicaid Other | Attending: Emergency Medicine | Admitting: Emergency Medicine

## 2018-10-16 ENCOUNTER — Encounter: Payer: Self-pay | Admitting: Emergency Medicine

## 2018-10-16 ENCOUNTER — Other Ambulatory Visit: Payer: Self-pay

## 2018-10-16 DIAGNOSIS — J45909 Unspecified asthma, uncomplicated: Secondary | ICD-10-CM | POA: Insufficient documentation

## 2018-10-16 DIAGNOSIS — K130 Diseases of lips: Secondary | ICD-10-CM | POA: Insufficient documentation

## 2018-10-16 DIAGNOSIS — R110 Nausea: Secondary | ICD-10-CM | POA: Insufficient documentation

## 2018-10-16 DIAGNOSIS — F1510 Other stimulant abuse, uncomplicated: Secondary | ICD-10-CM | POA: Insufficient documentation

## 2018-10-16 DIAGNOSIS — R509 Fever, unspecified: Secondary | ICD-10-CM | POA: Insufficient documentation

## 2018-10-16 DIAGNOSIS — F17210 Nicotine dependence, cigarettes, uncomplicated: Secondary | ICD-10-CM | POA: Insufficient documentation

## 2018-10-16 DIAGNOSIS — F1210 Cannabis abuse, uncomplicated: Secondary | ICD-10-CM | POA: Insufficient documentation

## 2018-10-16 DIAGNOSIS — S01531A Puncture wound without foreign body of lip, initial encounter: Secondary | ICD-10-CM

## 2018-10-16 MED ORDER — CEPHALEXIN 500 MG PO CAPS: 500.0000 mg | ORAL_CAPSULE | Freq: Three times a day (TID) | ORAL | 0 refills | Status: DC

## 2018-10-16 NOTE — ED Provider Notes (Signed)
[facility] Emergency Department Provider Note  ____________________________________________   First MD Initiated Contact with Patient 10/16/18 [phone]     (approximate)  I have reviewed the triage vital signs and the nursing notes.   HISTORY  Chief Complaint Wound Infection   HPI Betty Gomez is a 32 y.o. female presents to the ED with complaint of left lower lip infection for approximately 5 days.  Patient states that it is gradually gotten worse.  She states that she used a "kit" to pierce her lower lip.  She states it is now infected and area has drained.  She states that she believes she has been running fever "as high as 103".  She states she has been nauseous.  She took Tylenol at 3 AM today.  She also states that she cannot tolerate anyone touching the area.  Currently she rates her pain as a 10/10.   Past Medical History:  Diagnosis Date  . Anxiety   . Asthma   . Depression    ok, came off meds, has someone she sees for this.  . Endometriosis   . Infection    UTI    Patient Active Problem List   Diagnosis Date Noted  . 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 Benzodiazepine abuse ([place]) 09/21/2017  . Normal labor 09/20/2016  . Labor and delivery, indication for care 09/08/2016  . Decreased fetal movement 07/24/2016  . Pregnancy 09/15/2015  . Insufficient prenatal care 09/15/2015  . Indication for care in labor or delivery 09/10/2015  . Braxton Hicks contractions 09/08/2015  . Candida albicans infection 08/16/2015  . Abnormal antenatal AFP screen 05/24/2015  . Irregular contractions 05/24/2015    Past Surgical History:  Procedure Laterality Date  . DIAGNOSTIC LAPAROSCOPY     endometriosis  . TUBAL LIGATION N/A 09/17/2015   Procedure: POST PARTUM TUBAL LIGATION;  Surgeon: Malachy Mood, MD;  Location: [facility] ORS;  Service: Gynecology;  Laterality: N/A;  . TUBAL LIGATION Bilateral 09/21/2016   Procedure: POST PARTUM TUBAL LIGATION;  Surgeon: Gae Dry, MD;  Location: [facility] ORS;  Service: Gynecology;  Laterality: Bilateral;    Prior to Admission medications   Medication Sig Start Date End Date Taking? Authorizing Provider  albuterol (PROVENTIL HFA;VENTOLIN HFA) 108 (90 Base) MCG/ACT inhaler Inhale 1-2 puffs into the lungs every 6 (six) hours as needed for wheezing or shortness of breath. 04/19/18  Yes Yu, Amy V, PA-C  ARIPiprazole (ABILIFY) 10 MG tablet Take 10 mg by mouth daily.   Yes [provider]  gabapentin (NEURONTIN) 300 MG capsule Take 300 mg by mouth 2 (two) times daily.   Yes [provider]  ibuprofen (ADVIL,MOTRIN) 800 MG tablet Take 1 tablet (800 mg total) by mouth 3 (three) times daily. 05/19/18  Yes Wieters, Hallie C, PA-C  traZODone (DESYREL) 100 MG tablet Take 200 mg by mouth at bedtime.   Yes [provider]  cephALEXin (KEFLEX) 500 MG capsule Take 1 capsule (500 mg total) by mouth 3 (three) times daily. 10/16/18   Johnn Hai, PA-C    Allergies Patient has no known allergies.  Family History  Problem Relation Age of Onset  . Hypertension Mother   . Diabetes Mother   . Heart disease Mother 13       failure  . Heart disease Maternal Grandfather   . Diabetes Paternal Grandmother   . Hypertension Paternal Grandmother   . Cancer Paternal Grandfather  lung  . Hearing loss Neg Hx     Social History Social History   Tobacco Use  . Smoking status: Heavy Tobacco Smoker    Packs/day: 1.00    Years: 10.00    Pack years: 10.00    Types: Cigarettes  . Smokeless tobacco: Never Used  Substance Use Topics  . Alcohol use: No  . Drug use: Not Currently    Types: Marijuana, Methamphetamines    Review of Systems Constitutional: Positive fever/negative chills ENT: Left lower lip swollen/infected. Cardiovascular: Denies chest pain. Respiratory: Denies shortness of breath. Gastrointestinal: No abdominal pain.  Positive  nausea, no vomiting. Skin: Left lower lip pierced. Neurological: Negative for headaches, focal weakness or numbness. ___________________________________________   PHYSICAL EXAM:  VITAL SIGNS: ED Triage Vitals  Enc Vitals Group     BP 10/16/18 0657 112/65     Pulse Rate 10/16/18 0657 89     Resp 10/16/18 0657 17     Temp 10/16/18 0657 97.7 F (36.5 C)     Temp Source 10/16/18 0657 Oral     SpO2 10/16/18 0657 97 %     Weight 10/16/18 0651 145 lb (65.8 kg)     Height 10/16/18 0651 5' 5"  (1.651 m)     Head Circumference --      Peak Flow --      Pain Score 10/16/18 0650 10     Pain Loc --      Pain Edu? --      Excl. in [place]? --    Constitutional: Alert and oriented. Well appearing and in no acute distress. Eyes: Conjunctivae are normal.  Head: Atraumatic. Nose: No congestion/rhinnorhea. Mouth/Throat: Mucous membranes are moist.  Oropharynx non-erythematous.  Left lower lip area is swollen with a self-inflicted puncture wound just below the vermilion border.  No active drainage present.  Area is markedly tender to light palpation.  No active drainage is noted inner lip.  No surrounding cellulitis is present.  No evidence of piercing stud is present. Neck: No stridor.   Hematological/Lymphatic/Immunilogical: No cervical lymphadenopathy. Cardiovascular: Normal rate, regular rhythm. Grossly normal heart sounds.  Good peripheral circulation. Respiratory: Normal respiratory effort.  No retractions. Lungs CTAB. Musculoskeletal: Moves upper and lower extremities any difficulty.  Normal gait was noted. Neurologic:  Normal speech and language. No gross focal neurologic deficits are appreciated.  Skin:  Skin is warm, dry. Psychiatric: Mood and affect are normal. Speech and behavior are normal.  ____________________________________________   LABS (all labs ordered are listed, but only abnormal results are displayed)  Labs Reviewed - No data to  display ____________________________________________   PROCEDURES  Procedure(s) performed: None  Procedures  Critical Care performed: No  ____________________________________________   INITIAL IMPRESSION / ASSESSMENT AND PLAN / ED COURSE  As part of my medical decision making, I reviewed the following data within the electronic MEDICAL RECORD NUMBER Notes from prior ED visits and [place] Controlled Substance Database  Patient presents to the ED with complaint of lip piercing infection.  Patient states that she appears strong lip approximately 5 days ago and is gradually become infected.  She endorses fever and has been taking Tylenol with the last dose being at approximately 3 AM.  She is afebrile in the ED.  Patient states she is unable to tolerate anyone touching her lip.  She was given a prescription for Keflex 500 mg 3 times daily for 10 days.  She is encouraged to use warm compresses to the area frequently.  She is to  return to the emergency department in 2 days for removal of the stud in her lip.  She is also encouraged to return to the emergency department if any severe worsening of her symptoms.  She will continue to take Tylenol or ibuprofen as needed for pain.  ____________________________________________   FINAL CLINICAL IMPRESSION(S) / ED DIAGNOSES  Final diagnoses:  Pierced lip infection     ED Discharge Orders         Ordered    cephALEXin (KEFLEX) 500 MG capsule  3 times daily,   Status:  Discontinued     10/16/18 0722    cephALEXin (KEFLEX) 500 MG capsule  3 times daily     10/16/18 6088           Note:  This document was prepared using Dragon voice recognition software and may include unintentional dictation errors.    Johnn Hai, PA-C 10/16/18 0930    Delman Kitten, MD 10/16/18 1640

## 2018-10-16 NOTE — ED Triage Notes (Addendum)
Pt says she tried to pierce her left lower lip herself about 5 days ago and now it's infected; area with drainage noted; pt says inside "is kind of like an ulcer"; pt says she's been running fevers, yesterday it was as high as 103; pt says she's nauseated as well; pt says she took Tylenol around 3am today

## 2018-10-16 NOTE — ED Notes (Signed)
Pt discharged home after verbalizing understanding of discharge instructions; nad noted. 

## 2018-10-16 NOTE — ED Notes (Signed)
Pt presents with lip infection on the left side of her lower lip. She pierced it Wednesday at home and it is now swollen and red. Pt alert & oriented with NAD noted.

## 2018-10-16 NOTE — Discharge Instructions (Addendum)
Follow-up with your primary care provider if any continued problems.  Begin using warm compresses to the area frequently.  Your antibiotic was sent to [facility] on [address].  You are to begin taking it every day for the next 10 days.  Return to the emergency department if any severe worsening of your symptoms such as fever or increased swelling

## 2018-10-18 ENCOUNTER — Emergency Department
Admission: EM | Admit: 2018-10-18 | Discharge: 2018-10-18 | Disposition: A | Discharge status: Home / Self Care | Payer: Medicaid Other | Attending: Emergency Medicine | Admitting: Emergency Medicine

## 2018-10-18 ENCOUNTER — Other Ambulatory Visit: Payer: Self-pay

## 2018-10-18 ENCOUNTER — Encounter: Payer: Self-pay | Admitting: Emergency Medicine

## 2018-10-18 DIAGNOSIS — Z79899 Other long term (current) drug therapy: Secondary | ICD-10-CM | POA: Diagnosis not present

## 2018-10-18 DIAGNOSIS — Y33XXXA Other specified events, undetermined intent, initial encounter: Secondary | ICD-10-CM | POA: Diagnosis not present

## 2018-10-18 DIAGNOSIS — S00551D Superficial foreign body of lip, subsequent encounter: Secondary | ICD-10-CM

## 2018-10-18 DIAGNOSIS — J45909 Unspecified asthma, uncomplicated: Secondary | ICD-10-CM | POA: Insufficient documentation

## 2018-10-18 DIAGNOSIS — F17210 Nicotine dependence, cigarettes, uncomplicated: Secondary | ICD-10-CM | POA: Diagnosis not present

## 2018-10-18 DIAGNOSIS — S00501D Unspecified superficial injury of lip, subsequent encounter: Secondary | ICD-10-CM | POA: Diagnosis present

## 2018-10-18 MED ORDER — HYDROCODONE-ACETAMINOPHEN 5-325 MG PO TABS
1.0000 | ORAL_TABLET | Freq: Once | ORAL | Status: AC
  Administered 2018-10-18: 1 via ORAL
  Filled 2018-10-18: qty 1

## 2018-10-18 MED ORDER — LIDOCAINE-EPINEPHRINE 2 %-1:100000 IJ SOLN
20.0000 mL | Freq: Once | INTRAMUSCULAR | Status: AC
  Administered 2018-10-18: 20 mL
  Filled 2018-10-18: qty 1

## 2018-10-18 MED ORDER — ACETAMINOPHEN-CODEINE 300-30 MG PO TABS: 1.0000 | ORAL_TABLET | Freq: Four times a day (QID) | ORAL | 0 refills | Status: DC | PRN

## 2018-10-18 NOTE — ED Triage Notes (Signed)
PT was seen for self piercing infection to the lower portion of her bottom left lip. PT states the scab was pulled off this am.

## 2018-10-18 NOTE — ED Notes (Signed)
piercing stud through pt left bottom lip. Area on outside appears red and inflamed, pt states swelling has decreased since starting antibiotics. Pt states a flat metal backing in healed inside of her lip and she is unable to remove stud due to backing being inclosed inside bottom lip. On observation inside of lip appears to be healed over the piercing with no metal exposed on the inside of lip.

## 2018-10-18 NOTE — Discharge Instructions (Addendum)
Continue taking antibiotics until completely finished. Take pain medication only if needed every 6 hours.  After this you may take Tylenol or ibuprofen as needed for pain.  Soft diet today.  Warm compresses to the area frequently.  Follow-up with your primary care provider if any continued problems.

## 2018-10-18 NOTE — ED Provider Notes (Signed)
[facility] Emergency Department Provider Note   ____________________________________________   First MD Initiated Contact with Patient 10/18/18 1230     (approximate)  I have reviewed the triage vital signs and the nursing notes.   HISTORY  Chief Complaint Lip pain   HPI Betty Gomez is a 32 y.o. female presents to the ED with complaint of continued foreign body to her lower lip.  She was seen earlier in the week at which time she had moderate amount of infection and foreign body was not removed due to patient's inability to tolerate being touched.  She is here today to have area explored and piercing removed.  She is her pain as a 10/10.  Past Medical History:  Diagnosis Date  . Anxiety   . Asthma   . Depression    ok, came off meds, has someone she sees for this.  . Endometriosis   . Infection    UTI    Patient Active Problem List   Diagnosis Date Noted  . 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 Benzodiazepine abuse ([place]) 09/21/2017  . Normal labor 09/20/2016  . Labor and delivery, indication for care 09/08/2016  . Decreased fetal movement 07/24/2016  . Pregnancy 09/15/2015  . Insufficient prenatal care 09/15/2015  . Indication for care in labor or delivery 09/10/2015  . Braxton Hicks contractions 09/08/2015  . Candida albicans infection 08/16/2015  . Abnormal antenatal AFP screen 05/24/2015  . Irregular contractions 05/24/2015    Past Surgical History:  Procedure Laterality Date  . DIAGNOSTIC LAPAROSCOPY     endometriosis  . TUBAL LIGATION N/A 09/17/2015   Procedure: POST PARTUM TUBAL LIGATION;  Surgeon: Malachy Mood, MD;  Location: [facility] ORS;  Service: Gynecology;  Laterality: N/A;  . TUBAL LIGATION Bilateral 09/21/2016   Procedure: POST PARTUM TUBAL LIGATION;  Surgeon: Gae Dry, MD;  Location: [facility] ORS;  Service: Gynecology;  Laterality: Bilateral;    Prior to Admission  medications   Medication Sig Start Date End Date Taking? Authorizing Provider  Acetaminophen-Codeine (TYLENOL/CODEINE #3) 300-30 MG tablet Take 1 tablet by mouth every 6 (six) hours as needed for pain. 10/18/18   Johnn Hai, PA-C  albuterol (PROVENTIL HFA;VENTOLIN HFA) 108 (90 Base) MCG/ACT inhaler Inhale 1-2 puffs into the lungs every 6 (six) hours as needed for wheezing or shortness of breath. 04/19/18   Tasia Catchings, Amy V, PA-C  ARIPiprazole (ABILIFY) 10 MG tablet Take 10 mg by mouth daily.    [provider]  cephALEXin (KEFLEX) 500 MG capsule Take 1 capsule (500 mg total) by mouth 3 (three) times daily. 10/16/18   Johnn Hai, PA-C  gabapentin (NEURONTIN) 300 MG capsule Take 300 mg by mouth 2 (two) times daily.    [provider]  ibuprofen (ADVIL,MOTRIN) 800 MG tablet Take 1 tablet (800 mg total) by mouth 3 (three) times daily. 05/19/18   Wieters, Hallie C, PA-C  traZODone (DESYREL) 100 MG tablet Take 200 mg by mouth at bedtime.    [provider]    Allergies Patient has no known allergies.  Family History  Problem Relation Age of Onset  . Hypertension Mother   . Diabetes Mother   . Heart disease Mother 87       failure  . Heart disease Maternal Grandfather   . Diabetes Paternal Grandmother   . Hypertension Paternal Grandmother   . Cancer Paternal Grandfather        lung  . Hearing  loss Neg Hx     Social History Social History   Tobacco Use  . Smoking status: Heavy Tobacco Smoker    Packs/day: 1.00    Years: 10.00    Pack years: 10.00    Types: Cigarettes  . Smokeless tobacco: Never Used  Substance Use Topics  . Alcohol use: No  . Drug use: Not Currently    Types: Marijuana, Methamphetamines    Review of Systems Constitutional: No fever/chills ENT: Left lower lip edematous. Cardiovascular: Denies chest pain. Gastrointestinal: No abdominal pain.  No nausea, no vomiting. Musculoskeletal: Negative for back pain. Skin: Left lower lip  piercing with infection. Neurological: Negative for headaches, focal weakness or numbness. ___________________________________________   PHYSICAL EXAM:  VITAL SIGNS: ED Triage Vitals  Enc Vitals Group     BP 10/18/18 1219 128/69     Pulse Rate 10/18/18 1219 86     Resp 10/18/18 1219 16     Temp 10/18/18 1219 (!) 97.5 F (36.4 C)     Temp Source 10/18/18 1219 Oral     SpO2 10/18/18 1219 99 %     Weight 10/18/18 1218 145 lb (65.8 kg)     Height 10/18/18 1218 _0  (1.651 m)     Head Circumference --      Peak Flow --      Pain Score 10/18/18 1217 10     Pain Loc --      Pain Edu? --      Excl. in [place]? --    Constitutional: Alert and oriented. Well appearing and in no acute distress. Eyes: Conjunctivae are normal.  Head: Atraumatic. Mouth/Throat: Mucous membranes are moist.   Neck: No stridor.   Cardiovascular: Normal rate, regular rhythm. Grossly normal heart sounds.  Good peripheral circulation. Respiratory: Normal respiratory effort.  No retractions. Lungs CTAB. Gastrointestinal: Soft and nontender. No distention. Musculoskeletal: No lower extremity tenderness nor edema.  No joint effusions. Neurologic:  Normal speech and language. No gross focal neurologic deficits are appreciated.  Skin:  Skin is warm, dry.  Left lower lip with moderate edema but less than when she was seen 2 days ago.  Purulent material is noted at the site of piercing.  Tender to palpation. Psychiatric: Mood and affect are normal. Speech and behavior are normal.  ____________________________________________   LABS (all labs ordered are listed, but only abnormal results are displayed)  Labs Reviewed - No data to display    PROCEDURES  Procedure(s) performed:   .Foreign Body Removal Date/Time: 10/18/2018 2:06 PM Performed by: Johnn Hai, PA-C Authorized by: Johnn Hai, PA-C  Consent: Verbal consent obtained. Consent given by: patient Patient understanding: patient states  understanding of the procedure being performed Patient consent: the patient's understanding of the procedure matches consent given Procedure consent: procedure consent matches procedure scheduled Site marked: the operative site was marked Patient identity confirmed: verbally with patient Body area: skin General location: head/neck Location details: mouth Anesthesia: local infiltration  Anesthesia: Local Anesthetic: lidocaine 1% with epinephrine  Sedation: Patient sedated: no  Patient restrained: no Removal mechanism: hemostat and scalpel Dressing: dressing applied Tendon involvement: none Depth: subcutaneous Complexity: simple 1 objects recovered. Objects recovered: lip ring Post-procedure assessment: foreign body removed Patient tolerance: Patient tolerated the procedure well with no immediate complications    Critical Care performed: No  ____________________________________________   INITIAL IMPRESSION / ASSESSMENT AND PLAN / ED COURSE  As part of my medical decision making, I reviewed the following data within the electronic medical  record:  Notes from prior ED visits and [place] Controlled Substance Database  Patient presents today with piercing to her left lower lip that she did with a home kit that has been infected.  She was seen 2 days ago in the ED at which time she was placed on antibiotics.  She returns today to have the piercing removed.  Patient tolerated procedure well.  She will continue taking antibiotics until completely finished.  She is encouraged to eat soft foods today and clean the area frequently.  Patient was given a prescription for Tylenol with codeine for pain every 6 hours as needed.  She is aware that she may return to the emergency department if any severe worsening of her symptoms.  ____________________________________________   FINAL CLINICAL IMPRESSION(S) / ED DIAGNOSES  Final diagnoses:  Foreign body in lip, subsequent encounter     ED  Discharge Orders         Ordered    Acetaminophen-Codeine (TYLENOL/CODEINE #3) 300-30 MG tablet  Every 6 hours PRN     10/18/18 1413           Note:  This document was prepared using Dragon voice recognition software and may include unintentional dictation errors.    Johnn Hai, PA-C 10/18/18 1450    Lisa Roca, MD 10/18/18 1550

## 2018-11-08 ENCOUNTER — Other Ambulatory Visit: Payer: Self-pay

## 2018-11-08 ENCOUNTER — Encounter: Payer: Self-pay | Admitting: Emergency Medicine

## 2018-11-08 ENCOUNTER — Emergency Department
Admission: EM | Admit: 2018-11-08 | Discharge: 2018-11-08 | Disposition: A | Discharge status: Home / Self Care | Payer: Medicaid Other | Attending: Emergency Medicine | Admitting: Emergency Medicine

## 2018-11-08 DIAGNOSIS — E860 Dehydration: Secondary | ICD-10-CM | POA: Insufficient documentation

## 2018-11-08 DIAGNOSIS — Z79899 Other long term (current) drug therapy: Secondary | ICD-10-CM | POA: Diagnosis not present

## 2018-11-08 DIAGNOSIS — H1033 Unspecified acute conjunctivitis, bilateral: Secondary | ICD-10-CM | POA: Diagnosis not present

## 2018-11-08 DIAGNOSIS — F17210 Nicotine dependence, cigarettes, uncomplicated: Secondary | ICD-10-CM | POA: Insufficient documentation

## 2018-11-08 DIAGNOSIS — J45909 Unspecified asthma, uncomplicated: Secondary | ICD-10-CM | POA: Insufficient documentation

## 2018-11-08 DIAGNOSIS — H5789 Other specified disorders of eye and adnexa: Secondary | ICD-10-CM | POA: Diagnosis present

## 2018-11-08 MED ORDER — POLYMYXIN B-TRIMETHOPRIM 10000-0.1 UNIT/ML-% OP SOLN: 2.0000 [drp] | Freq: Four times a day (QID) | OPHTHALMIC | 0 refills | Status: DC

## 2018-11-08 MED ORDER — IBUPROFEN 400 MG PO TABS
400.0000 mg | ORAL_TABLET | Freq: Once | ORAL | Status: AC | PRN
  Administered 2018-11-08: 400 mg via ORAL

## 2018-11-08 MED ORDER — IBUPROFEN 400 MG PO TABS
ORAL_TABLET | ORAL | Status: AC
  Filled 2018-11-08: qty 1

## 2018-11-08 MED ORDER — SODIUM CHLORIDE 0.9 % IV BOLUS
1000.0000 mL | Freq: Once | INTRAVENOUS | Status: AC
  Administered 2018-11-08: 1000 mL via INTRAVENOUS

## 2018-11-08 MED ORDER — TETRACAINE HCL 0.5 % OP SOLN
2.0000 [drp] | Freq: Once | OPHTHALMIC | Status: AC
Start: 2018-11-08 — End: 2018-11-08
  Administered 2018-11-08: 2 [drp] via OPHTHALMIC

## 2018-11-08 MED ORDER — ACETAMINOPHEN 325 MG PO TABS
650.0000 mg | ORAL_TABLET | Freq: Once | ORAL | Status: AC
  Administered 2018-11-08: 650 mg via ORAL
  Filled 2018-11-08: qty 2

## 2018-11-08 MED ORDER — KETOROLAC TROMETHAMINE 0.5 % OP SOLN: 1.0000 [drp] | Freq: Four times a day (QID) | OPHTHALMIC | 0 refills | Status: DC

## 2018-11-08 NOTE — ED Notes (Signed)
See triage note   States she thinks she had gotten something in both eyes  States she washed her face and rinsed her eyes  Both eyes red and irritated

## 2018-11-08 NOTE — ED Triage Notes (Signed)
Pt in via POV with acute onset of irritation and yellow discharge from bilateral eyes.  Redness noted to bilateral eyes.  Pt reports rubbing them and washing them out without any relief.  NAD noted at this time.

## 2018-11-08 NOTE — ED Provider Notes (Signed)
[facility] Emergency Department Provider Note  ____________________________________________  Time seen: Approximately 5:59 PM  I have reviewed the triage vital signs and the nursing notes.   HISTORY  Chief Complaint Eye Pain    HPI Betty Gomez is a 32 y.o. female who presents the emergency department with 2 complaints.  Patient's primary complaint is bilateral eye pain, redness, drainage.  Patient reports that she was working on her house, got some drywall in her eyes.  Patient has been rubbing at same, patient does have purulent drainage from both eyes and a burning/irritated sensation to both eyes.  Patient denies any visual changes.  She does not wear glasses or contacts.  Patient reports that she is used illicit drugs, feels that she is withdrawing and is dehydrated.  Patient reports that her mouth feels dry, she is having difficulty staying hydrated with fluids.  Patient is requesting IV fluids for discharge.   Patient denies any headache, chest pain, shortness of breath, abdominal pain, nausea or vomiting.   Past Medical History:  Diagnosis Date  . Anxiety   . Asthma   . Depression    ok, came off meds, has someone she sees for this.  . Endometriosis   . Infection    UTI    Patient Active Problem List   Diagnosis Date Noted  . 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 Benzodiazepine abuse ([facility]) 09/21/2017  . Normal labor 09/20/2016  . Labor and delivery, indication for care 09/08/2016  . Decreased fetal movement 07/24/2016  . Pregnancy 09/15/2015  . Insufficient prenatal care 09/15/2015  . Indication for care in labor or delivery 09/10/2015  . Braxton Hicks contractions 09/08/2015  . Candida albicans infection 08/16/2015  . Abnormal antenatal AFP screen 05/24/2015  . Irregular contractions 05/24/2015    Past Surgical History:  Procedure Laterality Date  . DIAGNOSTIC LAPAROSCOPY     endometriosis   . TUBAL LIGATION N/A 09/17/2015   Procedure: POST PARTUM TUBAL LIGATION;  Surgeon: Vena AustriaAndreas Staebler, MD;  Location: [facility] ORS;  Service: Gynecology;  Laterality: N/A;  . TUBAL LIGATION Bilateral 09/21/2016   Procedure: POST PARTUM TUBAL LIGATION;  Surgeon: Nadara Mustardobert P Harris, MD;  Location: [facility] ORS;  Service: Gynecology;  Laterality: Bilateral;    Prior to Admission medications   Medication Sig Start Date End Date Taking? Authorizing Provider  Acetaminophen-Codeine (TYLENOL/CODEINE #3) 300-30 MG tablet Take 1 tablet by mouth every 6 (six) hours as needed for pain. 10/18/18   Tommi RumpsSummers, Rhonda L, PA-C  albuterol (PROVENTIL HFA;VENTOLIN HFA) 108 (90 Base) MCG/ACT inhaler Inhale 1-2 puffs into the lungs every 6 (six) hours as needed for wheezing or shortness of breath. 04/19/18   Cathie HoopsYu, Amy V, PA-C  ARIPiprazole (ABILIFY) 10 MG tablet Take 10 mg by mouth daily.    [provider]  cephALEXin (KEFLEX) 500 MG capsule Take 1 capsule (500 mg total) by mouth 3 (three) times daily. 10/16/18   Tommi RumpsSummers, Rhonda L, PA-C  gabapentin (NEURONTIN) 300 MG capsule Take 300 mg by mouth 2 (two) times daily.    [provider]  ibuprofen (ADVIL,MOTRIN) 800 MG tablet Take 1 tablet (800 mg total) by mouth 3 (three) times daily. 05/19/18   Wieters, Hallie C, PA-C  ketorolac (ACULAR) 0.5 % ophthalmic solution Place 1 drop into the right eye 4 (four) times daily. 11/08/18   , Delorise RoyalsJonathan D, PA-C  traZODone (DESYREL) 100 MG tablet Take 200 mg by mouth at bedtime.    [provider]  trimethoprim-polymyxin b (POLYTRIM) ophthalmic solution Place 2 drops into both eyes every 6 (six) hours. 11/08/18   , Delorise Royals, PA-C    Allergies Patient has no known allergies.  Family History  Problem Relation Age of Onset  . Hypertension Mother   . Diabetes Mother   . Heart disease Mother 51       failure  . Heart disease Maternal Grandfather   . Diabetes Paternal Grandmother   . Hypertension  Paternal Grandmother   . Cancer Paternal Grandfather        lung  . Hearing loss Neg Hx     Social History Social History   Tobacco Use  . Smoking status: Heavy Tobacco Smoker    Packs/day: 0.50    Years: 10.00    Pack years: 5.00    Types: Cigarettes  . Smokeless tobacco: Never Used  Substance Use Topics  . Alcohol use: No  . Drug use: Yes    Types: Marijuana, Methamphetamines     Review of Systems  Constitutional: No fever/chills.  Patient reports that she feels dehydrated as she is withdrawing from illicit drugs. Eyes: No visual changes.  Bilateral eye irritation, purulent drainage out of both eyes. ENT: No upper respiratory complaints. Cardiovascular: no chest pain. Respiratory: no cough. No SOB. Gastrointestinal: No abdominal pain.  No nausea, no vomiting.   Musculoskeletal: Negative for musculoskeletal pain. Skin: Negative for rash, abrasions, lacerations, ecchymosis. Neurological: Negative for headaches, focal weakness or numbness. 10-point ROS otherwise negative.  ____________________________________________   PHYSICAL EXAM:  VITAL SIGNS: ED Triage Vitals  Enc Vitals Group     BP 11/08/18 1636 120/85     Pulse Rate 11/08/18 1636 (!) 109     Resp 11/08/18 1636 16     Temp 11/08/18 1636 97.9 F (36.6 C)     Temp Source 11/08/18 1636 Oral     SpO2 11/08/18 1636 97 %     Weight 11/08/18 1638 145 lb (65.8 kg)     Height 11/08/18 1638 5\' 6"  (1.676 m)     Head Circumference --      Peak Flow --      Pain Score 11/08/18 1636 10     Pain Loc --      Pain Edu? --      Excl. in [place]? --      Constitutional: Alert and oriented. Well appearing and in no acute distress. Eyes: Conjunctivae are erythematous bilaterally.  Purulent drainage noted bilateral lower eyelashes.Marland Kitchen PERRL. EOMI. funduscopic exam reveals red reflex bilaterally.  Vasculature and optic disc is unremarkable bilaterally. Head: Atraumatic. ENT:      Ears:       Nose: No congestion/rhinnorhea.       Mouth/Throat: Mucous membranes are dry. Neck: No stridor.  Cardiovascular: Normal rate, regular rhythm. Normal S1 and S2.  Good peripheral circulation. Respiratory: Normal respiratory effort without tachypnea or retractions. Lungs CTAB. Good air entry to the bases with no decreased or absent breath sounds. Musculoskeletal: Full range of motion to all extremities. No gross deformities appreciated. Neurologic:  Normal speech and language. No gross focal neurologic deficits are appreciated.  Skin:  Skin is warm, dry and intact. No rash noted. Psychiatric: Mood and affect are normal. Speech and behavior are normal. Patient exhibits appropriate insight and judgement.   ____________________________________________   LABS (all labs ordered are listed, but only abnormal results are displayed)  Labs Reviewed - No data to display ____________________________________________  EKG   ____________________________________________  RADIOLOGY   No results found.  ____________________________________________    PROCEDURES  Procedure(s) performed:    Procedures    Medications  sodium chloride 0.9 % bolus 1,000 mL (1,000 mLs Intravenous New Bag/Given 11/08/18 1812)  ibuprofen (ADVIL,MOTRIN) tablet 400 mg (400 mg Oral Given 11/08/18 1643)  tetracaine (PONTOCAINE) 0.5 % ophthalmic solution 2 drop (2 drops Both Eyes Given by Other 11/08/18 1805)  acetaminophen (TYLENOL) tablet 650 mg (650 mg Oral Given 11/08/18 1816)     ____________________________________________   INITIAL IMPRESSION / ASSESSMENT AND PLAN / ED COURSE  Pertinent labs & imaging results that were available during my care of the patient were reviewed by me and considered in my medical decision making (see chart for details).  Review of the [place] CSRS was performed in accordance of the [facility] prior to dispensing any controlled drugs.      Patient's diagnosis is consistent with bacterial conjunctivitis withdrawal from  illicit medications.  Patient presents emergency department with bilateral eye irritation, purulent drainage.  Findings are consistent with bacterial conjunctivitis.  Differential included foreign body, viral conjunctivitis, chemical conjunctivitis.  Patient also requested IV for "dehydration" as she is been unable to maintain good oral hydration as she is withdrawing from illicit medications.  Patient was given IV fluids, Tylenol and Motrin for headache.  Patient reports good symptom improvement after IV fluids.  No indication for work-up to include labs or imaging at this time.  Follow-up primary care as needed.  Patient will be discharged with Polytrim and Acular for conjunctivitis..   Patient is given ED precautions to return to the ED for any worsening or new symptoms.     ____________________________________________  FINAL CLINICAL IMPRESSION(S) / ED DIAGNOSES  Final diagnoses:  Acute bacterial conjunctivitis of both eyes      NEW MEDICATIONS STARTED DURING THIS VISIT:  ED Discharge Orders         Ordered    trimethoprim-polymyxin b (POLYTRIM) ophthalmic solution  Every 6 hours     11/08/18 1900    ketorolac (ACULAR) 0.5 % ophthalmic solution  4 times daily     11/08/18 1900              This chart was dictated using voice recognition software/Dragon. Despite best efforts to proofread, errors can occur which can change the meaning. Any change was purely unintentional.    Racheal Patches, PA-C 11/08/18 Elwin Sleight, [place], MD 11/09/18 [id number]

## 2018-11-16 ENCOUNTER — Other Ambulatory Visit: Payer: Self-pay

## 2018-11-16 ENCOUNTER — Emergency Department
Admission: EM | Admit: 2018-11-16 | Discharge: 2018-11-17 | Disposition: A | Discharge status: Other Acute Inpatient Hospital | Payer: Medicaid Other | Attending: Emergency Medicine | Admitting: Emergency Medicine

## 2018-11-16 DIAGNOSIS — F1210 Cannabis abuse, uncomplicated: Secondary | ICD-10-CM | POA: Diagnosis not present

## 2018-11-16 DIAGNOSIS — F4325 Adjustment disorder with mixed disturbance of emotions and conduct: Secondary | ICD-10-CM | POA: Diagnosis present

## 2018-11-16 DIAGNOSIS — F17210 Nicotine dependence, cigarettes, uncomplicated: Secondary | ICD-10-CM | POA: Diagnosis not present

## 2018-11-16 DIAGNOSIS — F1010 Alcohol abuse, uncomplicated: Secondary | ICD-10-CM | POA: Diagnosis not present

## 2018-11-16 DIAGNOSIS — F319 Bipolar disorder, unspecified: Secondary | ICD-10-CM | POA: Insufficient documentation

## 2018-11-16 DIAGNOSIS — F1310 Sedative, hypnotic or anxiolytic abuse, uncomplicated: Secondary | ICD-10-CM | POA: Diagnosis present

## 2018-11-16 DIAGNOSIS — F331 Major depressive disorder, recurrent, moderate: Secondary | ICD-10-CM

## 2018-11-16 DIAGNOSIS — F329 Major depressive disorder, single episode, unspecified: Secondary | ICD-10-CM

## 2018-11-16 DIAGNOSIS — R45851 Suicidal ideations: Secondary | ICD-10-CM | POA: Diagnosis not present

## 2018-11-16 DIAGNOSIS — E876 Hypokalemia: Secondary | ICD-10-CM | POA: Insufficient documentation

## 2018-11-16 DIAGNOSIS — F1510 Other stimulant abuse, uncomplicated: Secondary | ICD-10-CM | POA: Diagnosis not present

## 2018-11-16 DIAGNOSIS — J45909 Unspecified asthma, uncomplicated: Secondary | ICD-10-CM | POA: Diagnosis not present

## 2018-11-16 DIAGNOSIS — Z79899 Other long term (current) drug therapy: Secondary | ICD-10-CM | POA: Diagnosis not present

## 2018-11-16 LAB — SALICYLATE LEVEL: Salicylate Lvl: <7 mg/dL (ref 2.8–30.0)

## 2018-11-16 LAB — COMPREHENSIVE METABOLIC PANEL
ALT: 311 U/L — ABNORMAL HIGH (ref 0–44)
AST: 172 U/L — ABNORMAL HIGH (ref 15–41)
Albumin: 3.9 g/dL (ref 3.5–5.0)
Alkaline Phosphatase: 113 U/L (ref 38–126)
Anion gap: 7 (ref 5–15)
BILIRUBIN TOTAL: 0.5 mg/dL (ref 0.3–1.2)
BUN: 10 mg/dL (ref 6–20)
CHLORIDE: 105 mmol/L (ref 98–111)
CO2: 25 mmol/L (ref 22–32)
Calcium: 9.1 mg/dL (ref 8.9–10.3)
Creatinine, Ser: 0.83 mg/dL (ref 0.44–1.00)
GFR calc Af Amer: >60 mL/min (ref >60)
GFR calc non Af Amer: >60 mL/min (ref >60)
Glucose, Bld: 111 mg/dL — ABNORMAL HIGH (ref 70–99)
Potassium: 3.1 mmol/L — ABNORMAL LOW (ref 3.5–5.1)
Sodium: 137 mmol/L (ref 135–145)
Total Protein: 7.3 g/dL (ref 6.5–8.1)

## 2018-11-16 LAB — ETHANOL: ALCOHOL ETHYL (B): 12 mg/dL — AB (ref <10)

## 2018-11-16 LAB — CBC
HCT: 40.9 % (ref 36.0–46.0)
Hemoglobin: 13.4 g/dL (ref 12.0–15.0)
MCH: 30 pg (ref 26.0–34.0)
MCHC: 32.8 g/dL (ref 30.0–36.0)
MCV: 91.7 fL (ref 80.0–100.0)
Platelets: 266 10*3/uL (ref 150–400)
RBC: 4.46 MIL/uL (ref 3.87–5.11)
RDW: 12.9 % (ref 11.5–15.5)
WBC: 9.7 10*3/uL (ref 4.0–10.5)
nRBC: 0 % (ref 0.0–0.2)

## 2018-11-16 LAB — ACETAMINOPHEN LEVEL: Acetaminophen (Tylenol), Serum: <10 ug/mL — ABNORMAL LOW (ref 10–30)

## 2018-11-16 MED ORDER — FOLIC ACID 1 MG PO TABS: 1.0000 mg | ORAL_TABLET | Freq: Every day | ORAL | Status: DC

## 2018-11-16 MED ORDER — LORAZEPAM 2 MG/ML IJ SOLN: 1.0000 mg | Freq: Four times a day (QID) | INTRAMUSCULAR | Status: DC | PRN

## 2018-11-16 MED ORDER — NICOTINE 21 MG/24HR TD PT24
MEDICATED_PATCH | TRANSDERMAL | Status: AC
  Filled 2018-11-16: qty 1

## 2018-11-16 MED ORDER — ADULT MULTIVITAMIN W/MINERALS CH: 1.0000 | ORAL_TABLET | Freq: Every day | ORAL | Status: DC

## 2018-11-16 MED ORDER — ACETAMINOPHEN 325 MG PO TABS: 650.0000 mg | ORAL_TABLET | ORAL | Status: DC | PRN

## 2018-11-16 MED ORDER — CHLORDIAZEPOXIDE HCL 25 MG PO CAPS
25.0000 mg | ORAL_CAPSULE | Freq: Once | ORAL | Status: DC
  Filled 2018-11-16: qty 1

## 2018-11-16 MED ORDER — LORAZEPAM 1 MG PO TABS: 1.0000 mg | ORAL_TABLET | Freq: Four times a day (QID) | ORAL | Status: DC | PRN

## 2018-11-16 MED ORDER — VITAMIN B-1 100 MG PO TABS: 100.0000 mg | ORAL_TABLET | Freq: Every day | ORAL | Status: DC

## 2018-11-16 MED ORDER — THIAMINE HCL 100 MG/ML IJ SOLN: 100.0000 mg | Freq: Every day | INTRAMUSCULAR | Status: DC

## 2018-11-16 MED ORDER — LORAZEPAM 1 MG PO TABS: 1.0000 mg | ORAL_TABLET | Freq: Every day | ORAL | Status: DC

## 2018-11-16 MED ORDER — ARIPIPRAZOLE 10 MG PO TABS
10.0000 mg | ORAL_TABLET | Freq: Every day | ORAL | Status: DC
  Filled 2018-11-16: qty 1

## 2018-11-16 MED ORDER — NICOTINE 21 MG/24HR TD PT24
21.0000 mg | MEDICATED_PATCH | Freq: Every day | TRANSDERMAL | Status: DC
  Administered 2018-11-16: 21 mg via TRANSDERMAL

## 2018-11-16 MED ORDER — POTASSIUM CHLORIDE CRYS ER 20 MEQ PO TBCR
40.0000 meq | EXTENDED_RELEASE_TABLET | Freq: Once | ORAL | Status: AC
  Administered 2018-11-16: 40 meq via ORAL
  Filled 2018-11-16: qty 2

## 2018-11-16 NOTE — BH Assessment (Signed)
Assessment Note  Betty Gomez is an 32 y.o. female.Who presents from home due to her families concerns of depression and suicidal thoughts. Patient states that her parents brought her here after speaking with them today about her addiction issues. Patient states that she recently went through a traumatic event in which she found her uncle after he passed away. She reported that this occurred only a few days ago. She states that on today she attempted to speak with her mother and father as she has been depressed and undergoing a lot of undue stress surrounding her relationship.Per her report she currently live with her grandmother and her 4  children live with her mother and father.  Patient shares that she has a history of bipolar disorder and PTSD. She continued to explained that  in the past taking both Klonopin and Xanax for anxiety both of which have been discontinued. This occurred approximately 1 month ago. Patient reports that she receives mental health services at [facility] and is currently still taking Gabapentin and Abilify. She shares that due to her inability to receive medication for anxiety she began to use drugs to manage symptoms. Patient reports using both heroin and methamphetamines, most recent news on today.There is no indication pt is currently responding to internal stimuli or experiencing delusional thought content.Pt. denies any suicidal ideation, plan or intent. Pt. denies the presence of any auditory or visual hallucinations at this time. Patient denies any other medical complaints.   Diagnosis: Bipolar Disorder   Past Medical History:  Past Medical History:  Diagnosis Date  . Anxiety   . Asthma   . Depression    ok, came off meds, has someone she sees for this.  . Endometriosis   . Infection    UTI    Past Surgical History:  Procedure Laterality Date  . DIAGNOSTIC LAPAROSCOPY     endometriosis  . TUBAL LIGATION N/A 09/17/2015   Procedure: POST  PARTUM TUBAL LIGATION;  Surgeon: Vena Austria, MD;  Location: [facility] ORS;  Service: Gynecology;  Laterality: N/A;  . TUBAL LIGATION Bilateral 09/21/2016   Procedure: POST PARTUM TUBAL LIGATION;  Surgeon: Nadara Mustard, MD;  Location: [facility] ORS;  Service: Gynecology;  Laterality: Bilateral;    Family History:  Family History  Problem Relation Age of Onset  . Hypertension Mother   . Diabetes Mother   . Heart disease Mother 60       failure  . Heart disease Maternal Grandfather   . Diabetes Paternal Grandmother   . Hypertension Paternal Grandmother   . Cancer Paternal Grandfather        lung  . Hearing loss Neg Hx     Social History:  reports that she has been smoking cigarettes. She has a 5.00 pack-year smoking history. She has never used smokeless tobacco. She reports current drug use. Drugs: Marijuana and Methamphetamines. She reports that she does not drink alcohol.  Additional Social History:  Alcohol / Drug Use Pain Medications: See PTA Prescriptions: See PTA Over the Counter: See PTA Longest period of sobriety (when/how long): Unable to quantify  Negative Consequences of Use: Personal relationships, Work / School Substance #1 Name of Substance 1: Meth  1 - Age of First Use: 32 yo 1 - Amount (size/oz): Unable to quantify  1 - Frequency: denied regular use  1 - Duration: unknown 1 - Last Use / Amount: On today  Substance #2 Name of Substance 2: Heroin  2 - Age of First Use: 32  yo 2 - Amount (size/oz): Unable to quantify  2 - Frequency: varies 2 - Duration: "Only a few months" 2 - Last Use / Amount: UTA  CIWA: CIWA-Ar BP: (!) 128/101 Pulse Rate: (!) 113 COWS:    Allergies: No Known Allergies  Home Medications: (Not in a hospital admission)   OB/GYN Status:  No LMP recorded (lmp unknown).  General Assessment Data Location of Assessment: [facility] ED TTS Assessment: In system Is this a Tele or Face-to-Face Assessment?: Face-to-Face Is this an Initial Assessment  or a Re-assessment for this encounter?: Initial Assessment Patient Accompanied by:: N/A Language Other than English: No What gender do you identify as?: Female Living Arrangements: Other relatives Can pt return to current living arrangement?: Yes Admission Status: Voluntary Petitioner: Family member Is patient capable of signing voluntary admission?: Yes Referral Source: Self/Family/Friend Insurance type: MEDICAID   Medical Screening Exam [facility] Walk-in ONLY) Medical Exam completed: Yes  Crisis Care Plan Living Arrangements: Other relatives Legal Guardian: Other:(none) Name of Psychiatrist: [facility] Name of Therapist: [employer]  Education Status Is patient currently in school?: No Is the patient employed, unemployed or receiving disability?: Unemployed  Risk to self with the past 6 months Suicidal Ideation: No Has patient been a risk to self within the past 6 months prior to admission? : No Suicidal Intent: No Has patient had any suicidal intent within the past 6 months prior to admission? : No Is patient at risk for suicide?: No, but patient needs Medical Clearance Suicidal Plan?: No Has patient had any suicidal plan within the past 6 months prior to admission? : No Access to Means: No What has been your use of drugs/alcohol within the last 12 months?: n Previous Attempts/Gestures: No How many times?: 0 Other Self Harm Risks: drug use  Triggers for Past Attempts: None known Intentional Self Injurious Behavior: None Family Suicide History: No Recent stressful life event(s): Turmoil (Comment), Trauma (Comment), Divorce, Conflict (Comment) Persecutory voices/beliefs?: No Depression: Yes Depression Symptoms: Tearfulness, Insomnia Substance abuse history and/or treatment for substance abuse?: Yes Suicide prevention information given to non-admitted patients: Not applicable  Risk to Others within the past 6 months Homicidal Ideation: No Does patient have any lifetime  risk of violence toward others beyond the six months prior to admission? : No Thoughts of Harm to Others: No Current Homicidal Intent: No Current Homicidal Plan: No Access to Homicidal Means: No History of harm to others?: No Assessment of Violence: None Noted Does patient have access to weapons?: No Criminal Charges Pending?: No Does patient have a court date: No Is patient on probation?: No  Psychosis Hallucinations: None noted Delusions: None noted  Mental Status Report Appearance/Hygiene: In scrubs Eye Contact: Good Motor Activity: Freedom of movement Speech: Soft Level of Consciousness: Alert Mood: Sad Affect: Preoccupied Anxiety Level: Minimal Thought Processes: Relevant, Coherent Judgement: Partial Orientation: Time, Place, Person, Situation Obsessive Compulsive Thoughts/Behaviors: None  Cognitive Functioning Concentration: Fair Memory: Recent Intact, Remote Intact Is patient IDD: No Insight: Fair Impulse Control: Fair Appetite: Fair Have you had any weight changes? : No Change Sleep: No Change Vegetative Symptoms: None  ADLScreening [facility] Assessment Services) Patient's cognitive ability adequate to safely complete daily activities?: Yes Patient able to express need for assistance with ADLs?: Yes Independently performs ADLs?: Yes (appropriate for developmental age)  Prior Inpatient Therapy Prior Inpatient Therapy: Yes Prior Therapy Dates: 2019 Prior Therapy Facilty/Provider(s): [facility] Reason for Treatment: Bipolar  Prior Outpatient Therapy Prior Outpatient Therapy: Yes Prior Therapy Dates: 2019 [facility]  Reason for Treatment: Bipolar Disorder Does patient have an ACCT team?: No Does patient have Intensive In-House Services?  : No Does patient have [facility] services? : No Does patient have P4CC services?: No  ADL Screening (condition at time of admission) Patient's cognitive ability adequate to safely complete daily  activities?: Yes Patient able to express need for assistance with ADLs?: Yes Independently performs ADLs?: Yes (appropriate for developmental age)       Abuse/Neglect Assessment (Assessment to be complete while patient is alone) Abuse/Neglect Assessment Can Be Completed: Yes Physical Abuse: Denies Verbal Abuse: Denies Sexual Abuse: Denies Exploitation of patient/patient's resources: Denies Self-Neglect: Denies Possible abuse reported to:: [place] department of social services Values / Beliefs Cultural Requests During Hospitalization: None Spiritual Requests During Hospitalization: None Consults Spiritual Care Consult Needed: No Social Work Consult Needed: No            Disposition:  Disposition Initial Assessment Completed for this Encounter: Yes Patient referred [facility]: Other (Comment[facility]  On Site Evaluation by:   Reviewed with Physician:    Asa SaunasShawanna N Dustine Bertini 11/16/2018 5:30 AM

## 2018-11-16 NOTE — ED Triage Notes (Signed)
Pt here for depression, family states she has been feeling suicidal. Pt denies any SI at this time.

## 2018-11-16 NOTE — ED Notes (Signed)
PT  PLACED  UNDER IVC PAPERS  PER  DR  CLAPACS   PENDING  PLACEMENT

## 2018-11-16 NOTE — ED Notes (Signed)
Hourly rounding reveals patient in room. No complaints, stable, in no acute distress. Q15 minute rounds and monitoring via Security Cameras to continue. 

## 2018-11-16 NOTE — ED Notes (Signed)
Hourly rounding reveals patient sleeping in room. No complaints, stable, in no acute distress. Q15 minute rounds and monitoring via Security to continue. 

## 2018-11-16 NOTE — ED Notes (Signed)
Pt was found by security in the restroom with another pt. Pt was removed from the restroom and reminded of our policy that only one person may be in the restroom at a time. Pt was compliant and went back to her room as asked.

## 2018-11-16 NOTE — BH Assessment (Signed)
[facility] by Dr. Toni Amendlapacs.  Attending Physician will be Dr. Carmon Ginsberg'Neil.   Patient [facility].   ER staff is aware of the admission:  Misty StanleyLisa, ER Secretary    Dr. Sharma CovertNorman, ER MD   Prudencio Burlyhea, Patient's Nurse   Lupita DawnGwen S., Patient Access.

## 2018-11-16 NOTE — ED Notes (Signed)
Report to include Situation, Background, Assessment, and Recommendations received from Rhea RN. Patient alert and oriented, warm and dry, in no acute distress. Patient denies SI, HI, AVH and pain. Patient made aware of Q15 minute rounds and security cameras for their safety. Patient instructed to come to me with needs or concerns. 

## 2018-11-16 NOTE — Consult Note (Signed)
[facility] Face-to-Face Psychiatry Consult   Reason for Consult: Consult for this 32 year old woman who comes to the emergency room agitated disorganized making suicidal statements Referring Physician: Sharma Covert [name] MRN:  [id number] Principal Diagnosis: Suicidal ideation Diagnosis:  Principal Problem:   Suicidal ideation Active Problems:   Adjustment disorder with mixed disturbance of emotions and conduct   Benzodiazepine abuse (HCC)   Depression   Alcohol abuse   Total Time spent with patient: 1 hour  Subjective:   Betty Gomez is a 32 y.o. female patient admitted with "drugs".  HPI: Patient seen chart reviewed.  32 year old woman came to the emergency room agitated making suicidal statements.  Patient has been waxing and waning in her mental state since she came in.  When I came to see her she was very sleepy and difficult to arouse.  When she did wake up she had a hard time staying awake.  Her history was contradictory.  The first thing she told me was that she was here for drugs.  Within a minute she told me that she did not use any drugs at all and had never had a drug problem.  A few minutes later she told me that her primary problem was drug use.  Through all of this he remained disorganized tearful and agitated.  Mood is depressed lonely and down.  Has not slept in several days.  Patient is vague about the time course of all of this.  She says her mood has been bad for years but has been worse recently.  She denies that she is currently taking any medication for mental health care.  She is supposed to be going to [place] and they have prescribed Suboxone for her but she is not taking it now and has also been noncompliant with prescribed Abilify and gabapentin.  Patient denied to me that she had been drinking although she did have a small amount of alcohol in her system and her liver function tests are elevated.  We have not yet been able to get a urine drug  screen from her.  Social history: Unclear where she has been staying.  Says that she stays here and there with various family members.  She talks about how 2 or 3 days ago she witnessed her uncle dying of an overdose.  She has 4 children unclear if any of them are actually living with her right now.  Medical history: No known medical problems outside of her substance abuse  Substance abuse history: Very vague because of her reluctance to be honest and upfront about it.  We have seen her before in the emergency room for agitated behavior that seem to be related to alcohol use.  As I said earlier the patient told me within minutes that she either had no drug problem or that she "uses all the drugs".  He does however say that she is supposed to be at drug court in [place].  Past Psychiatric History: Details a little fuzzy.  The only time we have seen her before psychiatrically we did not admit her.  She however tells me that she has been admitted to hospitals in the past but cannot remember which ones.  Do not have any records available for that.  She says she has tried to kill herself before but again is sketchy with the details.  She has been prescribed Abilify and gabapentin by [facility] apparently with the thought that she has a mood disorder possibly bipolar but she  is noncompliant with him.  Risk to Self: Suicidal Ideation: No Suicidal Intent: No Is patient at risk for suicide?: No, but patient needs Medical Clearance Suicidal Plan?: No Access to Means: No What has been your use of drugs/alcohol within the last 12 months?: n How many times?: 0 Other Self Harm Risks: drug use  Triggers for Past Attempts: None known Intentional Self Injurious Behavior: None Risk to Others: Homicidal Ideation: No Thoughts of Harm to Others: No Current Homicidal Intent: No Current Homicidal Plan: No Access to Homicidal Means: No History of harm to others?: No Assessment of Violence: None Noted Does patient  have access to weapons?: No Criminal Charges Pending?: No Does patient have a court date: No Prior Inpatient Therapy: Prior Inpatient Therapy: Yes Prior Therapy Dates: 2019 Prior Therapy Facilty/Provider(s): [facility] Reason for Treatment: Bipolar Prior Outpatient Therapy: Prior Outpatient Therapy: Yes Prior Therapy Dates: 2019 Prior Therapy Facilty/Provider(s): [facility]  Reason for Treatment: Bipolar Disorder Does patient have an ACCT team?: No Does patient have Intensive In-House Services?  : No Does patient have [facility] services? : No Does patient have P4CC services?: No  Past Medical History:  Past Medical History:  Diagnosis Date  . Anxiety   . Asthma   . Depression    ok, came off meds, has someone she sees for this.  . Endometriosis   . Infection    UTI    Past Surgical History:  Procedure Laterality Date  . DIAGNOSTIC LAPAROSCOPY     endometriosis  . TUBAL LIGATION N/A 09/17/2015   Procedure: POST PARTUM TUBAL LIGATION;  Surgeon: Vena Austria, MD;  Location: [facility] ORS;  Service: Gynecology;  Laterality: N/A;  . TUBAL LIGATION Bilateral 09/21/2016   Procedure: POST PARTUM TUBAL LIGATION;  Surgeon: Nadara Mustard, MD;  Location: [facility] ORS;  Service: Gynecology;  Laterality: Bilateral;   Family History:  Family History  Problem Relation Age of Onset  . Hypertension Mother   . Diabetes Mother   . Heart disease Mother 62       failure  . Heart disease Maternal Grandfather   . Diabetes Paternal Grandmother   . Hypertension Paternal Grandmother   . Cancer Paternal Grandfather        lung  . Hearing loss Neg Hx    Family Psychiatric  History: Had an uncle who recently according to her died of a suicide attempt. Social History:  Social History   Substance and Sexual Activity  Alcohol Use No     Social History   Substance and Sexual Activity  Drug Use Yes  . Types: Marijuana, Methamphetamines    Social History   Socioeconomic History  . Marital status:  Married    Spouse name: Not on file  . Number of children: Not on file  . Years of education: Not on file  . Highest education level: Not on file  Occupational History  . Not on file  Social Needs  . Financial resource strain: Not on file  . Food insecurity:    Worry: Not on file    Inability: Not on file  . Transportation needs:    Medical: Not on file    Non-medical: Not on file  Tobacco Use  . Smoking status: Heavy Tobacco Smoker    Packs/day: 0.50    Years: 10.00    Pack years: 5.00    Types: Cigarettes  . Smokeless tobacco: Never Used  Substance and Sexual Activity  . Alcohol use: No  . Drug use: Yes  Types: Marijuana, Methamphetamines  . Sexual activity: Yes  Lifestyle  . Physical activity:    Days per week: Not on file    Minutes per session: Not on file  . Stress: Not on file  Relationships  . Social connections:    Talks on phone: Not on file    Gets together: Not on file    Attends religious service: Not on file    Active member of club or organization: Not on file    Attends meetings of clubs or organizations: Not on file    Relationship status: Not on file  Other Topics Concern  . Not on file  Social History Narrative  . Not on file   Additional Social History:    Allergies:  No Known Allergies  Labs:  Results for orders placed or performed during the hospital encounter of 11/16/18 (from the past 48 hour(s))  CBC     Status: None   Collection Time: 11/16/18 12:21 AM  Result Value Ref Range   WBC 9.7 4.0 - 10.5 K/uL   RBC 4.46 3.87 - 5.11 MIL/uL   Hemoglobin 13.4 12.0 - 15.0 g/dL   HCT 78.2 95.6 - 21.3 %   MCV 91.7 80.0 - 100.0 fL   MCH 30.0 26.0 - 34.0 pg   MCHC 32.8 30.0 - 36.0 g/dL   RDW 08.6 57.8 - 46.9 %   Platelets 266 150 - 400 K/uL   nRBC 0.0 0.0 - 0.2 %    Comment: Performed at [facility], [address] [place], [place] [address]  Comprehensive metabolic panel     Status: Abnormal   Collection Time: 11/16/18 12:21  AM  Result Value Ref Range   Sodium 137 135 - 145 mmol/L   Potassium 3.1 (L) 3.5 - 5.1 mmol/L   Chloride 105 98 - 111 mmol/L   CO2 25 22 - 32 mmol/L   Glucose, Bld 111 (H) 70 - 99 mg/dL   BUN 10 6 - 20 mg/dL   Creatinine, Ser 8.41 0.44 - 1.00 mg/dL   Calcium 9.1 8.9 - 32.4 mg/dL   Total Protein 7.3 6.5 - 8.1 g/dL   Albumin 3.9 3.5 - 5.0 g/dL   AST 401 (H) 15 - 41 U/L   ALT 311 (H) 0 - 44 U/L   Alkaline Phosphatase 113 38 - 126 U/L   Total Bilirubin 0.5 0.3 - 1.2 mg/dL   GFR calc non Af Amer >60 >60 mL/min   GFR calc Af Amer >60 >60 mL/min   Anion gap 7 5 - 15    Comment: Performed at [facility], [address] [place], [place] [address]  Ethanol     Status: Abnormal   Collection Time: 11/16/18 12:21 AM  Result Value Ref Range   Alcohol, Ethyl (B) 12 (H) <10 mg/dL    Comment: (NOTE) Lowest detectable limit for serum alcohol is 10 mg/dL. For medical purposes only. Performed at [facility], [address] [place], [place] [address]   Acetaminophen level     Status: Abnormal   Collection Time: 11/16/18 12:21 AM  Result Value Ref Range   Acetaminophen (Tylenol), Serum <10 (L) 10 - 30 ug/mL    Comment: (NOTE) Therapeutic concentrations vary significantly. A range of 10-30 ug/mL  may be an effective concentration for many patients. However, some  are best treated at concentrations outside of this range. Acetaminophen concentrations >150 ug/mL at 4 hours after ingestion  and >50 ug/mL at 12 hours after  ingestion are often associated with  toxic reactions. Performed at [facility], [address] [place], [place] [address]   Salicylate level     Status: None   Collection Time: 11/16/18 12:21 AM  Result Value Ref Range   Salicylate Lvl <7.0 2.8 - 30.0 mg/dL    Comment: Performed at [facility] [place], [address] [place], [place] [address]    Current Facility-Administered Medications  Medication Dose Route Frequency Provider Last  Rate Last Dose  . acetaminophen (TYLENOL) tablet 650 mg  650 mg Oral Q4H PRN Irean Hong, MD      . ARIPiprazole (ABILIFY) tablet 10 mg  10 mg Oral Daily Irean Hong, MD      . chlordiazePOXIDE (LIBRIUM) capsule 25 mg  25 mg Oral Once Steffani Dionisio, Jackquline Denmark, MD      . LORazepam (ATIVAN) tablet 1 mg  1 mg Oral Daily Irean Hong, MD   Stopped at 11/16/18 1113   Current Outpatient Medications  Medication Sig Dispense Refill  . Acetaminophen-Codeine (TYLENOL/CODEINE #3) 300-30 MG tablet Take 1 tablet by mouth every 6 (six) hours as needed for pain. 6 tablet 0  . albuterol (PROVENTIL HFA;VENTOLIN HFA) 108 (90 Base) MCG/ACT inhaler Inhale 1-2 puffs into the lungs every 6 (six) hours as needed for wheezing or shortness of breath. 1 Inhaler 0  . ARIPiprazole (ABILIFY) 10 MG tablet Take 10 mg by mouth daily.    . cephALEXin (KEFLEX) 500 MG capsule Take 1 capsule (500 mg total) by mouth 3 (three) times daily. 30 capsule 0  . gabapentin (NEURONTIN) 300 MG capsule Take 300 mg by mouth 2 (two) times daily.    Marland Kitchen ibuprofen (ADVIL,MOTRIN) 800 MG tablet Take 1 tablet (800 mg total) by mouth 3 (three) times daily. 21 tablet 0  . ketorolac (ACULAR) 0.5 % ophthalmic solution Place 1 drop into the right eye 4 (four) times daily. 5 mL 0  . traZODone (DESYREL) 100 MG tablet Take 200 mg by mouth at bedtime.    Marland Kitchen trimethoprim-polymyxin b (POLYTRIM) ophthalmic solution Place 2 drops into both eyes every 6 (six) hours. 10 mL 0    Musculoskeletal: Strength & Muscle Tone: within normal limits Gait & Station: unsteady Patient leans: N/A  Psychiatric Specialty Exam: Physical Exam  Nursing note and vitals reviewed. Constitutional: She appears well-developed and well-nourished.  HENT:  Head: Normocephalic and atraumatic.  Eyes: Pupils are equal, round, and reactive to light. Conjunctivae are normal.  Neck: Normal range of motion.  Cardiovascular: Regular rhythm and normal heart sounds.  Respiratory: Effort normal. No  respiratory distress.  GI: Soft.  Musculoskeletal: Normal range of motion.  Neurological: She is alert.  Skin: Skin is warm and dry.  Psychiatric: Her affect is blunt. Her speech is tangential and slurred. She is agitated. She is not aggressive. Thought content is not paranoid. Cognition and memory are impaired. She expresses impulsivity and inappropriate judgment. She expresses suicidal ideation. She expresses no homicidal ideation. She expresses no suicidal plans. She exhibits abnormal recent memory.    ROS  Blood pressure (!) 128/101, pulse (!) 113, temperature 97.8 F (36.6 C), temperature source Oral, resp. rate 20, height 5\' 5"  (1.651 m), weight 65.8 kg, SpO2 100 %, unknown if currently breastfeeding.Body mass index is 24.13 kg/m.  General Appearance: Disheveled  Eye Contact:  Minimal  Speech:  Slow and Slurred  Volume:  Decreased  Mood:  Anxious and Irritable  Affect:  Labile  Thought Process:  Disorganized  Orientation:  Negative  Thought Content:  Illogical, Paranoid Ideation, Rumination and Tangential  Suicidal Thoughts:  Yes.  without intent/plan  Homicidal Thoughts:  No  Memory:  Immediate;   Fair Recent;   Poor Remote;   Poor  Judgement:  Poor  Insight:  Lacking  Psychomotor Activity:  Restlessness  Concentration:  Concentration: Poor  Recall:  Poor  Fund of Knowledge:  Poor  Language:  Poor  Akathisia:  No  Handed:  Right  AIMS (if indicated):     Assets:  Physical Health  ADL's:  Impaired  Cognition:  Impaired,  Mild  Sleep:        Treatment Plan Summary: Daily contact with patient to assess and evaluate symptoms and progress in treatment, Medication management and Plan Patient who continues to be agitated emotional disorganized in her thinking.  Unclear if some of this is still intoxication or some form of withdrawal or all depression.  Does not appear safe to be discharged right now.  Continues to make suicidal statements.  I will continue the involuntary  commitment and plan that she will be admitted to the psychiatric hospital.  Orders will be done for detox medication and full set of labs 15-minute checks in place.  Case reviewed with TTS and emergency room doctor.  Disposition: Recommend psychiatric Inpatient admission when medically cleared.  Mordecai RasmussenJohn Amanat Hackel, MD 11/16/2018 4:13 PM

## 2018-11-16 NOTE — ED Notes (Signed)
Pt sleeping. Breakfast tray kept at RN station

## 2018-11-16 NOTE — ED Notes (Signed)
Hourly rounding reveals patient sleeping in room. No complaints, stable, in no acute distress. Q15 minute rounds and monitoring via Security Cameras to continue. 

## 2018-11-16 NOTE — ED Notes (Signed)
Pt sitting in day room socializing with other pts, pt was given a snack tray and a beverage. Pt in no acute distress. Nothing needed from staff at this time

## 2018-11-16 NOTE — ED Notes (Signed)
Pt refusing to take Librium.  Repeatedly asking for Valium.  Dr. Toni Amendlapacs made aware.

## 2018-11-16 NOTE — ED Provider Notes (Addendum)
[facility] Emergency Department Provider Note   ____________________________________________   First MD Initiated Contact with Patient 11/16/18 [phone]     (approximate)  I have reviewed the triage vital signs and the nursing notes.   HISTORY  Chief Complaint Depression    HPI Betty Gomez is a 32 y.o. female who presents to the ED from home with a chief complaint of depression.  Family reports patient has been feeling suicidal.  Patient denies active SI/HI/AH/VH at this time.  Voices no medical complaints.   Past Medical History:  Diagnosis Date  . Anxiety   . Asthma   . Depression    ok, came off meds, has someone she sees for this.  . Endometriosis   . Infection    UTI    Patient Active Problem List   Diagnosis Date Noted  . 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 Benzodiazepine abuse (HCC) 09/21/2017  . Normal labor 09/20/2016  . Labor and delivery, indication for care 09/08/2016  . Decreased fetal movement 07/24/2016  . Pregnancy 09/15/2015  . Insufficient prenatal care 09/15/2015  . Indication for care in labor or delivery 09/10/2015  . Braxton Hicks contractions 09/08/2015  . Candida albicans infection 08/16/2015  . Abnormal antenatal AFP screen 05/24/2015  . Irregular contractions 05/24/2015    Past Surgical History:  Procedure Laterality Date  . DIAGNOSTIC LAPAROSCOPY     endometriosis  . TUBAL LIGATION N/A 09/17/2015   Procedure: POST PARTUM TUBAL LIGATION;  Surgeon: Vena Austria, MD;  Location: [facility] ORS;  Service: Gynecology;  Laterality: N/A;  . TUBAL LIGATION Bilateral 09/21/2016   Procedure: POST PARTUM TUBAL LIGATION;  Surgeon: Nadara Mustard, MD;  Location: [facility] ORS;  Service: Gynecology;  Laterality: Bilateral;    Prior to Admission medications   Medication Sig Start Date End Date Taking? Authorizing Provider  Acetaminophen-Codeine (TYLENOL/CODEINE #3) 300-30 MG tablet  Take 1 tablet by mouth every 6 (six) hours as needed for pain. 10/18/18   Tommi Rumps, PA-C  albuterol (PROVENTIL HFA;VENTOLIN HFA) 108 (90 Base) MCG/ACT inhaler Inhale 1-2 puffs into the lungs every 6 (six) hours as needed for wheezing or shortness of breath. 04/19/18   Cathie Hoops, Amy V, PA-C  ARIPiprazole (ABILIFY) 10 MG tablet Take 10 mg by mouth daily.    [provider]  cephALEXin (KEFLEX) 500 MG capsule Take 1 capsule (500 mg total) by mouth 3 (three) times daily. 10/16/18   Tommi Rumps, PA-C  gabapentin (NEURONTIN) 300 MG capsule Take 300 mg by mouth 2 (two) times daily.    [provider]  ibuprofen (ADVIL,MOTRIN) 800 MG tablet Take 1 tablet (800 mg total) by mouth 3 (three) times daily. 05/19/18   Wieters, Hallie C, PA-C  ketorolac (ACULAR) 0.5 % ophthalmic solution Place 1 drop into the right eye 4 (four) times daily. 11/08/18   Cuthriell, Delorise Royals, PA-C  traZODone (DESYREL) 100 MG tablet Take 200 mg by mouth at bedtime.    [provider]  trimethoprim-polymyxin b (POLYTRIM) ophthalmic solution Place 2 drops into both eyes every 6 (six) hours. 11/08/18   Cuthriell, Delorise Royals, PA-C    Allergies Patient has no known allergies.  Family History  Problem Relation Age of Onset  . Hypertension Mother   . Diabetes Mother   . Heart disease Mother 3       failure  . Heart disease Maternal Grandfather   . Diabetes Paternal Grandmother   . Hypertension Paternal  Grandmother   . Cancer Paternal Grandfather        lung  . Hearing loss Neg Hx     Social History Social History   Tobacco Use  . Smoking status: Heavy Tobacco Smoker    Packs/day: 0.50    Years: 10.00    Pack years: 5.00    Types: Cigarettes  . Smokeless tobacco: Never Used  Substance Use Topics  . Alcohol use: No  . Drug use: Yes    Types: Marijuana, Methamphetamines    Review of Systems  Constitutional: No fever/chills Eyes: No visual changes. ENT: No sore  throat. Cardiovascular: Denies chest pain. Respiratory: Denies shortness of breath. Gastrointestinal: No abdominal pain.  No nausea, no vomiting.  No diarrhea.  No constipation. Genitourinary: Negative for dysuria. Musculoskeletal: Negative for back pain. Skin: Negative for rash. Neurological: Negative for headaches, focal weakness or numbness. Psychiatric:Positive for depression with SI.  ____________________________________________   PHYSICAL EXAM:  VITAL SIGNS: ED Triage Vitals  Enc Vitals Group     BP 11/16/18 0015 (!) 128/101     Pulse Rate 11/16/18 0015 (!) 113     Resp 11/16/18 0015 20     Temp 11/16/18 0015 97.8 F (36.6 C)     Temp Source 11/16/18 0015 Oral     SpO2 11/16/18 0015 100 %     Weight 11/16/18 0016 145 lb (65.8 kg)     Height 11/16/18 0016 5\' 5"  (1.651 m)     Head Circumference --      Peak Flow --      Pain Score 11/16/18 0016 0     Pain Loc --      Pain Edu? --      Excl. in GC? --     Constitutional: Alert and oriented. Well appearing and in no acute distress. Eyes: Conjunctivae are normal. PERRL. EOMI. Head: Atraumatic. Nose: No congestion/rhinnorhea. Mouth/Throat: Mucous membranes are moist.  Oropharynx non-erythematous. Neck: No stridor.   Cardiovascular: Normal rate, regular rhythm. Grossly normal heart sounds.  Good peripheral circulation. Respiratory: Normal respiratory effort.  No retractions. Lungs CTAB. Gastrointestinal: Soft and nontender. No distention. No abdominal bruits. No CVA tenderness. Musculoskeletal: No lower extremity tenderness nor edema.  No joint effusions. Neurologic:  Normal speech and language. No gross focal neurologic deficits are appreciated. No gait instability. Skin:  Skin is warm, dry and intact. No rash noted. Psychiatric: Mood and affect are flat. Speech and behavior are normal.  ____________________________________________   LABS (all labs ordered are listed, but only abnormal results are  displayed)  Labs Reviewed  COMPREHENSIVE METABOLIC PANEL - Abnormal; Notable for the following components:      Result Value   Potassium 3.1 (*)    Glucose, Bld 111 (*)    AST 172 (*)    ALT 311 (*)    All other components within normal limits  ETHANOL - Abnormal; Notable for the following components:   Alcohol, Ethyl (B) 12 (*)    All other components within normal limits  ACETAMINOPHEN LEVEL - Abnormal; Notable for the following components:   Acetaminophen (Tylenol), Serum <10 (*)    All other components within normal limits  CBC  SALICYLATE LEVEL  URINALYSIS, COMPLETE (UACMP) WITH MICROSCOPIC  URINE DRUG SCREEN, QUALITATIVE ([facility] ONLY)  POC URINE PREG, ED   ____________________________________________  EKG  None ____________________________________________  RADIOLOGY  ED MD interpretation: None  Official radiology report(s): No results found.  ____________________________________________   PROCEDURES  Procedure(s) performed: None  Procedures  Critical Care performed: No  ____________________________________________   INITIAL IMPRESSION / ASSESSMENT AND PLAN / ED COURSE  As part of my medical decision making, I reviewed the following data within the electronic MEDICAL RECORD NUMBER Nursing notes reviewed and incorporated, Labs reviewed, Old chart reviewed, A consult was requested and obtained from this/these consultant(s) Psychiatry and Notes from prior ED visits   32 year old female with a history of depression who presents for depression with suicidal thoughts.  Currently denies active SI.  Contracts for safety while in the emergency department.  Patient is medically cleared at this time.  Will remain voluntary in the ED pending psychiatric evaluation and disposition.   Clinical Course as of Nov 17 715  Thu Nov 16, 2018  0310 Laboratory results noted.  Will replete potassium.  Elevation of transaminases without elevation of T bili.  Patient denies  abdominal pain.  Patient may be transferred to the [facility] pending psychiatric evaluation and disposition.   [JS]  [phone] Patient awaiting [facility] psychiatry evaluation.  She will remain in the ED under voluntary status pending psychiatric evaluation and disposition.   [JS]  0715 Patient was evaluated by [facility] psychiatrist Dr. Loretha Brasil who recommends voluntary inpatient psychiatric admission.  Recommends Abilify 10 mg daily at 1 PM, Tylenol 650 every 4 hours as needed for pain, Ativan 1 mg daily at 9 AM.  Patient will be transferred to the [facility] awaiting psychiatric disposition.   [JS]    Clinical Course User Index [JS] Irean Hong, MD     ____________________________________________   FINAL CLINICAL IMPRESSION(S) / ED DIAGNOSES  Final diagnoses:  Moderate episode of recurrent major depressive disorder (HCC)  Hypokalemia     ED Discharge Orders    None       Note:  This document was prepared using Dragon voice recognition software and may include unintentional dictation errors.    Irean Hong, MD 11/16/18 9604    Irean Hong, MD 11/16/18 [phone]

## 2018-11-16 NOTE — ED Notes (Signed)
Pt refused to sign voluntary consent for treatment.  Pt continuously asking for Valium.  Repeatedly asking to speak with Dr. Toni Amendlapacs after he has already spoken to the patient.

## 2018-11-16 NOTE — ED Notes (Signed)
VOL/SOC completed/ Rec. Inpt. Admit/Pending Disposition

## 2018-11-17 ENCOUNTER — Encounter: Payer: Self-pay | Admitting: Psychiatry

## 2018-11-17 ENCOUNTER — Inpatient Hospital Stay: Payer: Medicaid Other

## 2018-11-17 ENCOUNTER — Inpatient Hospital Stay
Admission: AD | Admit: 2018-11-17 | Discharge: 2018-11-21 | DRG: 885 | Disposition: A | Discharge status: Home / Self Care | Payer: Medicaid Other | Source: Intra-hospital | Attending: Psychiatry | Admitting: Psychiatry

## 2018-11-17 DIAGNOSIS — F17210 Nicotine dependence, cigarettes, uncomplicated: Secondary | ICD-10-CM | POA: Diagnosis present

## 2018-11-17 DIAGNOSIS — Z91411 Personal history of adult psychological abuse: Secondary | ICD-10-CM

## 2018-11-17 DIAGNOSIS — F410 Panic disorder [episodic paroxysmal anxiety] without agoraphobia: Secondary | ICD-10-CM | POA: Diagnosis present

## 2018-11-17 DIAGNOSIS — N3000 Acute cystitis without hematuria: Secondary | ICD-10-CM | POA: Diagnosis present

## 2018-11-17 DIAGNOSIS — J45909 Unspecified asthma, uncomplicated: Secondary | ICD-10-CM | POA: Diagnosis present

## 2018-11-17 DIAGNOSIS — R4587 Impulsiveness: Secondary | ICD-10-CM | POA: Diagnosis present

## 2018-11-17 DIAGNOSIS — F1510 Other stimulant abuse, uncomplicated: Secondary | ICD-10-CM

## 2018-11-17 DIAGNOSIS — N898 Other specified noninflammatory disorders of vagina: Secondary | ICD-10-CM | POA: Diagnosis present

## 2018-11-17 DIAGNOSIS — F1110 Opioid abuse, uncomplicated: Secondary | ICD-10-CM

## 2018-11-17 DIAGNOSIS — Z91410 Personal history of adult physical and sexual abuse: Secondary | ICD-10-CM | POA: Diagnosis not present

## 2018-11-17 DIAGNOSIS — Z720 Tobacco use: Secondary | ICD-10-CM

## 2018-11-17 DIAGNOSIS — F1210 Cannabis abuse, uncomplicated: Secondary | ICD-10-CM

## 2018-11-17 DIAGNOSIS — F3130 Bipolar disorder, current episode depressed, mild or moderate severity, unspecified: Principal | ICD-10-CM | POA: Diagnosis present

## 2018-11-17 DIAGNOSIS — F1994 Other psychoactive substance use, unspecified with psychoactive substance-induced mood disorder: Secondary | ICD-10-CM | POA: Diagnosis not present

## 2018-11-17 DIAGNOSIS — F1010 Alcohol abuse, uncomplicated: Secondary | ICD-10-CM | POA: Diagnosis present

## 2018-11-17 DIAGNOSIS — Z9119 Patient's noncompliance with other medical treatment and regimen: Secondary | ICD-10-CM

## 2018-11-17 DIAGNOSIS — R062 Wheezing: Secondary | ICD-10-CM

## 2018-11-17 DIAGNOSIS — Z79899 Other long term (current) drug therapy: Secondary | ICD-10-CM | POA: Diagnosis not present

## 2018-11-17 DIAGNOSIS — F319 Bipolar disorder, unspecified: Secondary | ICD-10-CM

## 2018-11-17 DIAGNOSIS — F1910 Other psychoactive substance abuse, uncomplicated: Secondary | ICD-10-CM | POA: Diagnosis not present

## 2018-11-17 DIAGNOSIS — R05 Cough: Secondary | ICD-10-CM

## 2018-11-17 DIAGNOSIS — J189 Pneumonia, unspecified organism: Secondary | ICD-10-CM | POA: Diagnosis present

## 2018-11-17 DIAGNOSIS — I959 Hypotension, unspecified: Secondary | ICD-10-CM | POA: Diagnosis present

## 2018-11-17 DIAGNOSIS — Z818 Family history of other mental and behavioral disorders: Secondary | ICD-10-CM | POA: Diagnosis not present

## 2018-11-17 DIAGNOSIS — R058 Other specified cough: Secondary | ICD-10-CM

## 2018-11-17 DIAGNOSIS — F1924 Other psychoactive substance dependence with psychoactive substance-induced mood disorder: Secondary | ICD-10-CM | POA: Diagnosis present

## 2018-11-17 DIAGNOSIS — F332 Major depressive disorder, recurrent severe without psychotic features: Secondary | ICD-10-CM | POA: Insufficient documentation

## 2018-11-17 DIAGNOSIS — F1310 Sedative, hypnotic or anxiolytic abuse, uncomplicated: Secondary | ICD-10-CM | POA: Diagnosis not present

## 2018-11-17 DIAGNOSIS — E876 Hypokalemia: Secondary | ICD-10-CM | POA: Diagnosis not present

## 2018-11-17 DIAGNOSIS — F314 Bipolar disorder, current episode depressed, severe, without psychotic features: Secondary | ICD-10-CM | POA: Diagnosis not present

## 2018-11-17 LAB — URINALYSIS, COMPLETE (UACMP) WITH MICROSCOPIC
Bacteria, UA: NONE SEEN
Bilirubin Urine: NEGATIVE
Glucose, UA: NEGATIVE mg/dL
Ketones, ur: NEGATIVE mg/dL
Nitrite: NEGATIVE
Protein, ur: NEGATIVE mg/dL
SPECIFIC GRAVITY, URINE: 1.016 (ref 1.005–1.030)
WBC, UA: >50 WBC/hpf — ABNORMAL HIGH (ref 0–5)
pH: 5 (ref 5.0–8.0)

## 2018-11-17 LAB — LIPID PANEL
Cholesterol: 108 mg/dL (ref 0–200)
HDL: 52 mg/dL (ref >40)
LDL CALC: 45 mg/dL (ref 0–99)
Total CHOL/HDL Ratio: 2.1 RATIO
Triglycerides: 54 mg/dL (ref <150)
VLDL: 11 mg/dL (ref 0–40)

## 2018-11-17 LAB — TSH: TSH: 3.99 u[IU]/mL (ref 0.350–4.500)

## 2018-11-17 LAB — CHLAMYDIA/NGC RT PCR (ARMC ONLY)
Chlamydia Tr: NOT DETECTED
N gonorrhoeae: NOT DETECTED

## 2018-11-17 LAB — URINE DRUG SCREEN, QUALITATIVE (ARMC ONLY)
Amphetamines, Ur Screen: POSITIVE — AB
Barbiturates, Ur Screen: NOT DETECTED
Benzodiazepine, Ur Scrn: POSITIVE — AB
Cannabinoid 50 Ng, Ur ~~LOC~~: POSITIVE — AB
Cocaine Metabolite,Ur ~~LOC~~: POSITIVE — AB
MDMA (Ecstasy)Ur Screen: NOT DETECTED
Methadone Scn, Ur: NOT DETECTED
Opiate, Ur Screen: NOT DETECTED
Phencyclidine (PCP) Ur S: NOT DETECTED
Tricyclic, Ur Screen: NOT DETECTED

## 2018-11-17 LAB — HEMOGLOBIN A1C
Hgb A1c MFr Bld: 5 % (ref 4.8–5.6)
Mean Plasma Glucose: 96.8 mg/dL

## 2018-11-17 LAB — PREGNANCY, URINE: PREG TEST UR: NEGATIVE

## 2018-11-17 MED ORDER — THIAMINE HCL 100 MG/ML IJ SOLN: 100.0000 mg | Freq: Every day | INTRAMUSCULAR | Status: DC

## 2018-11-17 MED ORDER — LOPERAMIDE HCL 2 MG PO CAPS: 2.0000 mg | ORAL_CAPSULE | ORAL | Status: DC | PRN

## 2018-11-17 MED ORDER — GUAIFENESIN 100 MG/5ML PO SOLN
5.0000 mL | ORAL | Status: DC | PRN
  Administered 2018-11-21: 100 mg via ORAL
  Filled 2018-11-17 (×2): qty 5

## 2018-11-17 MED ORDER — ALBUTEROL SULFATE HFA 108 (90 BASE) MCG/ACT IN AERS
1.0000 | INHALATION_SPRAY | RESPIRATORY_TRACT | Status: DC | PRN
  Filled 2018-11-17 (×2): qty 6.7

## 2018-11-17 MED ORDER — MAGNESIUM HYDROXIDE 400 MG/5ML PO SUSP: 30.0000 mL | Freq: Every day | ORAL | Status: DC | PRN

## 2018-11-17 MED ORDER — NICOTINE 14 MG/24HR TD PT24
14.0000 mg | MEDICATED_PATCH | Freq: Every day | TRANSDERMAL | Status: DC
  Administered 2018-11-17 – 2018-11-21 (×5): 14 mg via TRANSDERMAL
  Filled 2018-11-17 (×6): qty 1

## 2018-11-17 MED ORDER — LORAZEPAM 1 MG PO TABS
1.0000 mg | ORAL_TABLET | Freq: Every day | ORAL | Status: DC
  Administered 2018-11-17: 1 mg via ORAL
  Filled 2018-11-17: qty 1

## 2018-11-17 MED ORDER — ADULT MULTIVITAMIN W/MINERALS CH
1.0000 | ORAL_TABLET | Freq: Every day | ORAL | Status: DC
  Administered 2018-11-17 – 2018-11-21 (×5): 1 via ORAL
  Filled 2018-11-17 (×5): qty 1

## 2018-11-17 MED ORDER — LORAZEPAM 2 MG/ML IJ SOLN: 2.0000 mg | Freq: Two times a day (BID) | INTRAMUSCULAR | Status: DC | PRN

## 2018-11-17 MED ORDER — ARIPIPRAZOLE 10 MG PO TABS
10.0000 mg | ORAL_TABLET | Freq: Every day | ORAL | Status: DC
  Administered 2018-11-17 – 2018-11-21 (×5): 10 mg via ORAL
  Filled 2018-11-17 (×5): qty 1

## 2018-11-17 MED ORDER — ACETAMINOPHEN 325 MG PO TABS: 650.0000 mg | ORAL_TABLET | Freq: Four times a day (QID) | ORAL | Status: DC | PRN

## 2018-11-17 MED ORDER — LORAZEPAM 2 MG/ML IJ SOLN: 1.0000 mg | Freq: Four times a day (QID) | INTRAMUSCULAR | Status: AC | PRN

## 2018-11-17 MED ORDER — AZITHROMYCIN 250 MG PO TABS
250.0000 mg | ORAL_TABLET | Freq: Every day | ORAL | Status: AC
  Administered 2018-11-18 – 2018-11-21 (×4): 250 mg via ORAL
  Filled 2018-11-17 (×4): qty 1

## 2018-11-17 MED ORDER — ACETAMINOPHEN 325 MG PO TABS
650.0000 mg | ORAL_TABLET | Freq: Four times a day (QID) | ORAL | Status: DC | PRN
  Administered 2018-11-17: 650 mg via ORAL
  Filled 2018-11-17: qty 2

## 2018-11-17 MED ORDER — CEFDINIR 300 MG PO CAPS
300.0000 mg | ORAL_CAPSULE | Freq: Two times a day (BID) | ORAL | Status: DC
  Administered 2018-11-17 – 2018-11-21 (×8): 300 mg via ORAL
  Filled 2018-11-17 (×11): qty 1

## 2018-11-17 MED ORDER — TRAZODONE HCL 100 MG PO TABS
100.0000 mg | ORAL_TABLET | Freq: Every evening | ORAL | Status: DC | PRN
  Administered 2018-11-17 – 2018-11-20 (×4): 100 mg via ORAL
  Filled 2018-11-17 (×5): qty 1

## 2018-11-17 MED ORDER — FOLIC ACID 1 MG PO TABS
1.0000 mg | ORAL_TABLET | Freq: Every day | ORAL | Status: DC
  Administered 2018-11-17 – 2018-11-21 (×5): 1 mg via ORAL
  Filled 2018-11-17 (×5): qty 1

## 2018-11-17 MED ORDER — AZITHROMYCIN 500 MG PO TABS
500.0000 mg | ORAL_TABLET | Freq: Every day | ORAL | Status: AC
  Administered 2018-11-17: 500 mg via ORAL
  Filled 2018-11-17: qty 1

## 2018-11-17 MED ORDER — ALUM & MAG HYDROXIDE-SIMETH 200-200-20 MG/5ML PO SUSP: 30.0000 mL | ORAL | Status: DC | PRN

## 2018-11-17 MED ORDER — ACETAMINOPHEN 325 MG PO TABS: 650.0000 mg | ORAL_TABLET | ORAL | Status: DC | PRN

## 2018-11-17 MED ORDER — LORAZEPAM 1 MG PO TABS: 1.0000 mg | ORAL_TABLET | Freq: Four times a day (QID) | ORAL | Status: DC | PRN

## 2018-11-17 MED ORDER — LORAZEPAM 1 MG PO TABS: 1.0000 mg | ORAL_TABLET | Freq: Four times a day (QID) | ORAL | Status: AC | PRN

## 2018-11-17 MED ORDER — VITAMIN B-1 100 MG PO TABS
100.0000 mg | ORAL_TABLET | Freq: Every day | ORAL | Status: DC
  Administered 2018-11-17: 100 mg via ORAL
  Filled 2018-11-17: qty 1

## 2018-11-17 MED ORDER — HYDROXYZINE HCL 25 MG PO TABS
25.0000 mg | ORAL_TABLET | Freq: Three times a day (TID) | ORAL | Status: DC | PRN
  Administered 2018-11-18 – 2018-11-20 (×4): 25 mg via ORAL
  Filled 2018-11-17 (×4): qty 1

## 2018-11-17 MED ORDER — GABAPENTIN 300 MG PO CAPS
300.0000 mg | ORAL_CAPSULE | Freq: Two times a day (BID) | ORAL | Status: DC
  Administered 2018-11-18 – 2018-11-21 (×7): 300 mg via ORAL
  Filled 2018-11-17 (×7): qty 1

## 2018-11-17 MED ORDER — VITAMIN B-1 100 MG PO TABS
100.0000 mg | ORAL_TABLET | Freq: Every day | ORAL | Status: DC
  Administered 2018-11-18 – 2018-11-21 (×4): 100 mg via ORAL
  Filled 2018-11-17 (×4): qty 1

## 2018-11-17 MED ORDER — LORAZEPAM 2 MG/ML IJ SOLN: 1.0000 mg | Freq: Four times a day (QID) | INTRAMUSCULAR | Status: DC | PRN

## 2018-11-17 NOTE — Progress Notes (Signed)
   11/17/18 1030  Clinical Encounter Type  Visited With Patient;Health care provider  Visit Type Initial;Spiritual support;Behavioral Health  Referral From Social work  Consult/Referral To Chaplain  Spiritual Encounters  Spiritual Needs Emotional;Other (Comment)   CH attended the patient's treatment team meeting. The patient stated that her goal is be happier. The patient appeared to be very tired and slow of speech and thought. She did ask how her time at this hospital "would work?" The MD explained the process and she seemed to accept the answer. She requested inpatient rehab for drug rehab. The CSW will work on the options for rehab placement.

## 2018-11-17 NOTE — Progress Notes (Signed)
Recreation Therapy Notes  Date: 11/17/2018  Time: 9:30 pm   Location: Craft [facility]   Behavioral response: N/A   Intervention Topic: Stress  Discussion/Intervention: Patient did not attend group.   Clinical Observations/Feedback:  Patient did not attend group.   Achol Azpeitia LRT/CTRS        Taylore Hinde 11/17/2018 11:41 AM 
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
## 2018-11-17 NOTE — Plan of Care (Signed)
Problem: Safety: Goal: Ability to remain free from injury will improve Outcome: Progressing   Problem: Medication: Goal: Compliance with prescribed medication regimen will improve Outcome: Progressing DAR Note: Pt visible in bed on initial contact. Presents generalized fatigue and lethargic. Per pt "I'm too tired, weak and depressed". Observed with nonproductive coughs. Compliant with medications when offered. Denies SI, HI, AVH and pain when assessed. Required multiple prompts to get out of bed for medications and procedures. Seen this evening per consult order for c/o productive coughs with greenish/yellow phlegm and breathing difficulties. Started on antibiotics.  Scheduled medications given per MD's orders with verbal education and effects monitored. Encouraged pt to voice concerns, comply with current treatment regimen and attend to ADLs. Q 15 minutes safety checks continues without self harm gestures.  Pt has been medication compliant. Denies side effects at this time.  Pt receptive to care. Tolerates meals fairly. Remains safe on and off unit.

## 2018-11-17 NOTE — Consult Note (Signed)
[facility] - [place] [facility]   PATIENT NAME: Betty Gomez    MR#:  [id number]  DATE OF BIRTH:  [birth year]  DATE OF ADMISSION:  11/17/2018  PRIMARY CARE PHYSICIAN: Patient, No Pcp Per   REQUESTING/REFERRING PHYSICIAN: Dr Hessie Knows  CHIEF COMPLAINT:  Consult for pneumonia  HISTORY OF PRESENT ILLNESS:  Betty Gomez  is a 32 y.o. female complaining of a bad cough and cannot breathe very well going on for the past 3 days.  She states she has been coughing up greenish-yellow phlegm.  Slight wheeze some cough.  She is feeling a little lightheaded.  She is having some chills and sweats.  Hospitalist services were contacted for evaluation when a chest x-ray could not rule out pneumonia in the right middle lobe.   Patient was interviewed and examined with the patient's nurse Olivette present during the entire time  PAST MEDICAL HISTORY:   Past Medical History:  Diagnosis Date  . Anxiety   . Asthma   . Depression    ok, came off meds, has someone she sees for this.  . Endometriosis   . Infection    UTI    PAST SURGICAL HISTOIRY:   Past Surgical History:  Procedure Laterality Date  . DIAGNOSTIC LAPAROSCOPY     endometriosis  . TUBAL LIGATION N/A 09/17/2015   Procedure: POST PARTUM TUBAL LIGATION;  Surgeon: Vena Austria, MD;  Location: [facility] ORS;  Service: Gynecology;  Laterality: N/A;  . TUBAL LIGATION Bilateral 09/21/2016   Procedure: POST PARTUM TUBAL LIGATION;  Surgeon: Nadara Mustard, MD;  Location: [facility] ORS;  Service: Gynecology;  Laterality: Bilateral;    SOCIAL HISTORY:   Social History   Tobacco Use  . Smoking status: Heavy Tobacco Smoker    Packs/day: 0.50    Years: 10.00    Pack years: 5.00    Types: Cigarettes  . Smokeless tobacco: Never Used  Substance Use Topics  . Alcohol use: No    FAMILY HISTORY:   Family History  Problem Relation Age of Onset  . Hypertension Mother   . Diabetes Mother   . Heart disease Mother 53        failure  . Heart failure Mother   . Depression Father   . Heart disease Maternal Grandfather   . Diabetes Paternal Grandmother   . Hypertension Paternal Grandmother   . Cancer Paternal Grandfather        lung  . Hearing loss Neg Hx     DRUG ALLERGIES:  No Known Allergies  REVIEW OF SYSTEMS:  CONSTITUTIONAL: No fever.  Positive for chills and sweats.  Positive for weight loss.  Positive for fatigue.  EYES: Patient had some grease that went in her left eye and she has been having some issues with her left eye. EARS, NOSE, AND THROAT: No tinnitus or ear pain.  Positive for sore throat and dysphasia to both solids and liquids. RESPIRATORY: Positive for cough, shortness of breath, and wheezing.  No hemoptysis.  CARDIOVASCULAR: Some chest pain, no orthopnea, edema.  GASTROINTESTINAL: No nausea, vomiting, or abdominal pain.  Some diarrhea over the last 2 days. GENITOURINARY: No dysuria, hematuria.  ENDOCRINE: No polyuria, nocturia,  HEMATOLOGY: No anemia, easy bruising or bleeding SKIN: No rash or lesion. MUSCULOSKELETAL: No joint pain or arthritis.   NEUROLOGIC: Positive for lightheadedness. PSYCHIATRY: No anxiety or depression.   MEDICATIONS AT HOME:   Prior to Admission medications   Medication Sig Start Date End Date Taking? Authorizing Provider  Acetaminophen-Codeine (TYLENOL/CODEINE #3) 300-30 MG tablet Take 1 tablet by mouth every 6 (six) hours as needed for pain. 10/18/18   Tommi RumpsSummers, Rhonda L, PA-C  albuterol (PROVENTIL HFA;VENTOLIN HFA) 108 (90 Base) MCG/ACT inhaler Inhale 1-2 puffs into the lungs every 6 (six) hours as needed for wheezing or shortness of breath. 04/19/18   Cathie HoopsYu, Amy V, PA-C  ARIPiprazole (ABILIFY) 10 MG tablet Take 10 mg by mouth daily.    [provider]  cephALEXin (KEFLEX) 500 MG capsule Take 1 capsule (500 mg total) by mouth 3 (three) times daily. 10/16/18   Tommi RumpsSummers, Rhonda L, PA-C  gabapentin (NEURONTIN) 300 MG capsule Take 300 mg by mouth 2  (two) times daily.    [provider]  ibuprofen (ADVIL,MOTRIN) 800 MG tablet Take 1 tablet (800 mg total) by mouth 3 (three) times daily. 05/19/18   Wieters, Hallie C, PA-C  ketorolac (ACULAR) 0.5 % ophthalmic solution Place 1 drop into the right eye 4 (four) times daily. 11/08/18   Cuthriell, Delorise RoyalsJonathan D, PA-C  traZODone (DESYREL) 100 MG tablet Take 200 mg by mouth at bedtime.    [provider]  trimethoprim-polymyxin b (POLYTRIM) ophthalmic solution Place 2 drops into both eyes every 6 (six) hours. 11/08/18   Cuthriell, Delorise RoyalsJonathan D, PA-C      VITAL SIGNS:  Blood pressure 100/64, pulse 93, temperature 99.3 F (37.4 C), temperature source Oral, resp. rate 16, height 5\' 5"  (1.651 m), weight 67.6 kg, SpO2 95 %, unknown if currently breastfeeding.  PHYSICAL EXAMINATION:  GENERAL:  32 y.o.-year-old patient lying in the bed with no acute distress.  EYES: Pupils equal, round, reactive to light and accommodation. No scleral icterus. Extraocular muscles intact.  HEENT: Head atraumatic, normocephalic. Oropharynx and nasopharynx clear.  Tympanic membrane no erythema. NECK:  Supple, no jugular venous distention. No thyroid enlargement, no tenderness.  LUNGS: Normal breath sounds bilaterally, slight left upper lung wheeze, no rales,rhonchi or crepitation. No use of accessory muscles of respiration.  CARDIOVASCULAR: S1, S2 normal. No murmurs, rubs, or gallops.  ABDOMEN: Soft, nontender, nondistended. Bowel sounds present. No organomegaly or mass.  EXTREMITIES: No pedal edema, cyanosis, or clubbing.  NEUROLOGIC: Cranial nerves II through XII are intact. Muscle strength 5/5 in all extremities. Sensation intact. Gait not checked.  PSYCHIATRIC: The patient is alert and oriented x 3.  SKIN: No obvious rash, lesion, or ulcer.   LABORATORY PANEL:   CBC Recent Labs  Lab 11/16/18 0021  WBC 9.7  HGB 13.4  HCT 40.9  PLT 266    ------------------------------------------------------------------------------------------------------------------  Chemistries  Recent Labs  Lab 11/16/18 0021  NA 137  K 3.1*  CL 105  CO2 25  GLUCOSE 111*  BUN 10  CREATININE 0.83  CALCIUM 9.1  AST 172*  ALT 311*  ALKPHOS 113  BILITOT 0.5   ------------------------------------------------------------------------------------------------------------------    RADIOLOGY:  Dg Chest 2 View  Result Date: 11/17/2018 CLINICAL DATA:  Productive cough with wheezing EXAM: CHEST - 2 VIEW COMPARISON:  05/01/2018 FINDINGS: Equivocal hazy opacity at the level of the right middle lobe. There may be central airway thickening. No edema, effusion, or pneumothorax. Normal heart size. IMPRESSION: 1. Equivocal for early pneumonia in the right middle lobe. 2. Airway thickening. Electronically Signed   By: Marnee SpringJonathon  Watts M.D.   On: 11/17/2018 15:21    EKG:   Normal sinus rhythm 82 bpm  IMPRESSION AND PLAN:   1.  Pneumonia right middle lobe.  Since the patient is on the psychiatric floor I will give  Omnicef and Zithromax. 2.  Acute cystitis.  Follow-up urine culture.  Omnicef would cover the urine also. 3.  Relative hypotension.  Patient needs to stay hydrated. 4.  Polysubstance abuse with cocaine, methamphetamine, heroin that she admitted to.  Urine toxicology also positive for cannabis and benzodiazepine. 5.  Tobacco abuse.  Smoking cessation counseling done 4 minutes by me.  Nicotine patch already ordered. 6.  Elevated liver function tests likely secondary to polysubstance abuse.  Check liver function test tomorrow morning. 7.  Hypokalemia.  Check again tomorrow morning.  All the records are reviewed and case discussed with Consulting provider. Management plans discussed with the patient, and she is in agreement.  CODE STATUS: full code  TOTAL TIME TAKING CARE OF THIS PATIENT: 45 minutes.    Alford Highland M.D on 11/17/2018 at 8:27  PM  Between 7am to 6pm - Pager - [phone]  After 6pm go to [email or web address] - password [email or web address]  [facility]  Office  [phone]  CC: Primary care Physician: Patient, No Pcp Per

## 2018-11-17 NOTE — Progress Notes (Signed)
Recreation Therapy Notes  INPATIENT RECREATION THERAPY ASSESSMENT  Patient Details Name: Betty Gomez MRN: [id number] DOB: Jan 10, 1986 Today's Date: 11/17/2018       Information Obtained From: Chart Review  Able to Participate in Assessment/Interview:    Patient Presentation:    Reason for Admission (Per Patient): Active Symptoms  Patient Stressors: Death  Coping Skills:   Other (Comment), Substance Abuse(Cooking)  Leisure Interests (2+):  Social - Family(Outdoor activities)  Frequency of Recreation/Participation: Monthly  Awareness of Community Resources:     Community Resources:     Current Use:    If no, Barriers?:    Expressed Interest in State Street CorporationCommunity Resource Information:    [place]  Patient Main Form of Transportation:    Patient Strengths:  good mother  Patient Identified Areas of Improvement:  Drug use  Patient Goal for Hospitalization:  To be happier  Current SI (including self-harm):     Current HI:     Current AVH:    Staff Intervention Plan: Group Attendance, Collaborate with Interdisciplinary Treatment Team  Consent to Intern Participation: N/A  Elesa Garman 11/17/2018, 4:08 PM

## 2018-11-17 NOTE — Tx Team (Addendum)
Interdisciplinary Treatment and Diagnostic Plan Update  11/17/2018 Time of Session: 1030am Betty Gomez MRN: [id number]  Principal Diagnosis: <principal problem not specified>  Secondary Diagnoses: Active Problems:   Severe recurrent major depression without psychotic features (HCC)   Current Medications:  Current Facility-Administered Medications  Medication Dose Route Frequency Provider Last Rate Last Dose  . acetaminophen (TYLENOL) tablet 650 mg  650 mg Oral Q6H PRN Hessie Knows, MD      . albuterol (PROVENTIL HFA;VENTOLIN HFA) 108 (90 Base) MCG/ACT inhaler 1-2 puff  1-2 puff Inhalation Q4H PRN Hessie Knows, MD      . alum & mag hydroxide-simeth (MAALOX/MYLANTA) 200-200-20 MG/5ML suspension 30 mL  30 mL Oral Q4H PRN Clapacs, John T, MD      . ARIPiprazole (ABILIFY) tablet 10 mg  10 mg Oral Daily Clapacs, Jackquline Denmark, MD   10 mg at 11/17/18 0853  . folic acid (FOLVITE) tablet 1 mg  1 mg Oral Daily Clapacs, Jackquline Denmark, MD   1 mg at 11/17/18 0853  . [START ON 11/18/2018] gabapentin (NEURONTIN) capsule 300 mg  300 mg Oral BID Hessie Knows, MD      . guaiFENesin (ROBITUSSIN) 100 MG/5ML solution 100 mg  5 mL Oral Q4H PRN Hessie Knows, MD      . hydrOXYzine (ATARAX/VISTARIL) tablet 25 mg  25 mg Oral TID PRN Clapacs, John T, MD      . loperamide (IMODIUM) capsule 2 mg  2 mg Oral PRN Hessie Knows, MD      . LORazepam (ATIVAN) injection 1 mg  1 mg Intramuscular Q6H PRN Hessie Knows, MD       Or  . LORazepam (ATIVAN) tablet 1 mg  1 mg Oral Q6H PRN Hessie Knows, MD      . LORazepam (ATIVAN) injection 2 mg  2 mg Intramuscular BID PRN Hessie Knows, MD      . magnesium hydroxide (MILK OF MAGNESIA) suspension 30 mL  30 mL Oral Daily PRN Clapacs, John T, MD      . multivitamin with minerals tablet 1 tablet  1 tablet Oral Daily Clapacs, Jackquline Denmark, MD   1 tablet at 11/17/18 [phone]  . nicotine (NICODERM CQ - dosed in mg/24 hours) patch 14 mg  14 mg Transdermal Daily Clapacs, Jackquline Denmark, MD   14 mg at  11/17/18 0855  . [START ON 11/18/2018] thiamine (VITAMIN B-1) tablet 100 mg  100 mg Oral Daily O'Neal, Fransico Setters, MD      . traZODone (DESYREL) tablet 100 mg  100 mg Oral QHS PRN Clapacs, Jackquline Denmark, MD   100 mg at 11/17/18 0113   PTA Medications: Medications Prior to Admission  Medication Sig Dispense Refill Last Dose  . Acetaminophen-Codeine (TYLENOL/CODEINE #3) 300-30 MG tablet Take 1 tablet by mouth every 6 (six) hours as needed for pain. 6 tablet 0   . albuterol (PROVENTIL HFA;VENTOLIN HFA) 108 (90 Base) MCG/ACT inhaler Inhale 1-2 puffs into the lungs every 6 (six) hours as needed for wheezing or shortness of breath. 1 Inhaler 0 Past Month at Unknown time  . ARIPiprazole (ABILIFY) 10 MG tablet Take 10 mg by mouth daily.     . cephALEXin (KEFLEX) 500 MG capsule Take 1 capsule (500 mg total) by mouth 3 (three) times daily. 30 capsule 0   . gabapentin (NEURONTIN) 300 MG capsule Take 300 mg by mouth 2 (two) times daily.     Marland Kitchen ibuprofen (ADVIL,MOTRIN) 800 MG tablet Take 1 tablet (800 mg total) by mouth 3 (three)  times daily. 21 tablet 0 Past Week at Unknown time  . ketorolac (ACULAR) 0.5 % ophthalmic solution Place 1 drop into the right eye 4 (four) times daily. 5 mL 0   . traZODone (DESYREL) 100 MG tablet Take 200 mg by mouth at bedtime.     Marland Kitchen trimethoprim-polymyxin b (POLYTRIM) ophthalmic solution Place 2 drops into both eyes every 6 (six) hours. 10 mL 0     Patient Stressors: Substance abuse Traumatic event Other: anxiety, depression  Patient Strengths: Ability for insight Average or above average intelligence Capable of independent living Communication skills General fund of knowledge  Treatment Modalities: Medication Management, Group therapy, Case management,  1 to 1 session with clinician, Psychoeducation, Recreational therapy.   Physician Treatment Plan for Primary Diagnosis: <principal problem not specified> Long Term Goal(s):     Short Term Goals:    Medication Management:  Evaluate patient's response, side effects, and tolerance of medication regimen.  Therapeutic Interventions: 1 to 1 sessions, Unit Group sessions and Medication administration.  Evaluation of Outcomes: Progressing  Physician Treatment Plan for Secondary Diagnosis: Active Problems:   Severe recurrent major depression without psychotic features (HCC)  Long Term Goal(s):     Short Term Goals:       Medication Management: Evaluate patient's response, side effects, and tolerance of medication regimen.  Therapeutic Interventions: 1 to 1 sessions, Unit Group sessions and Medication administration.  Evaluation of Outcomes: Progressing   RN Treatment Plan for Primary Diagnosis: <principal problem not specified> Long Term Goal(s): Knowledge of disease and therapeutic regimen to maintain health will improve  Short Term Goals: Ability to verbalize feelings will improve, Ability to identify and develop effective coping behaviors will improve and Compliance with prescribed medications will improve  Medication Management: RN will administer medications as ordered by provider, will assess and evaluate patient's response and provide education to patient for prescribed medication. RN will report any adverse and/or side effects to prescribing provider.  Therapeutic Interventions: 1 on 1 counseling sessions, Psychoeducation, Medication administration, Evaluate responses to treatment, Monitor vital signs and CBGs as ordered, Perform/monitor CIWA, COWS, AIMS and Fall Risk screenings as ordered, Perform wound care treatments as ordered.  Evaluation of Outcomes: Progressing   LCSW Treatment Plan for Primary Diagnosis: <principal problem not specified> Long Term Goal(s): Safe transition to appropriate next level of care at discharge, Engage patient in therapeutic group addressing interpersonal concerns.  Short Term Goals: Engage patient in aftercare planning with referrals and resources  Therapeutic  Interventions: Assess for all discharge needs, 1 to 1 time with Social worker, Explore available resources and support systems, Assess for adequacy in community support network, Educate family and significant other(s) on suicide prevention, Complete Psychosocial Assessment, Interpersonal group therapy.  Evaluation of Outcomes: Progressing   Progress in Treatment: Attending groups: Yes. Participating in groups: Yes. Taking medication as prescribed: Yes. Toleration medication: Yes. Family/Significant other contact made: No, will contact:  pt declined Patient understands diagnosis: Yes. Discussing patient identified problems/goals with staff: Yes. Medical problems stabilized or resolved: Yes. Denies suicidal/homicidal ideation: Yes. Issues/concerns per patient self-inventory: No. Other: NA  New problem(s) identified: No, Describe:  None reported  New Short Term/Long Term Goal(s):"Be happier"  Patient Goals:  "Be happier"  Discharge Plan or Barriers: Pt will return home and follow up with outpatient treatment  Reason for Continuation of Hospitalization: Medication stabilization  Estimated Length of Stay:3-5 days  Recreational Therapy: Patient Stressor: Death, Relationship Patient Goal: Patient will engage in groups without prompting or encouragement from LRT  x3 group sessions within 5 recreation therapy group sessions   Attendees: Patient:Betty Gomez 11/17/2018 3:17 PM  Physician: Ardeen FillersSarita oneal, MD 11/17/2018 3:17 PM  Nursing: Bruce Donathyawn Chisem, RN 11/17/2018 3:17 PM  RN Care Manager: 11/17/2018 3:17 PM  Social Worker: Lowella Dandyarren Livingston LCSW 11/17/2018 3:17 PM  Recreational Therapist: Garret ReddishShay Arne Schlender, LRT 11/17/2018 3:17 PM  Other: Daleen SquibbGreg Wierda LCSW 11/17/2018 3:17 PM  Other: Damian LeavellJohn Mullins Chaplain 11/17/2018 3:17 PM  Other: 11/17/2018 3:17 PM    Scribe for Treatment Team: Suzan SlickARREN T LIVINGSTON, LCSW 11/17/2018 3:17 PM

## 2018-11-17 NOTE — Tx Team (Signed)
Initial Treatment Plan 11/17/2018 1:58 AM Betty CrankerAshley B Fredman JXB:[id number]    PATIENT STRESSORS: Substance abuse Traumatic event Other: anxiety, depression   PATIENT STRENGTHS: Ability for insight Average or above average intelligence Capable of independent living Communication skills General fund of knowledge   PATIENT IDENTIFIED PROBLEMS: Suicidal thoughts 11/17/18  Substance abuse 11/17/18  Depression 11/17/18  Anxiety 11/17/18               DISCHARGE CRITERIA:  Improved stabilization in mood, thinking, and/or behavior Need for constant or close observation no longer present Withdrawal symptoms are absent or subacute and managed without 24-hour nursing intervention  PRELIMINARY DISCHARGE PLAN: Attend aftercare/continuing care group Placement in alternative living arrangements  PATIENT/FAMILY INVOLVEMENT: This treatment plan has been presented to and reviewed with the patient, Betty Gomez.  The patient has been given the opportunity to ask questions and make suggestions.  Lenox Pondserry J Rayhana Slider, RN 11/17/2018, 1:58 AM

## 2018-11-17 NOTE — [facility] Suicide Risk Assessment (Signed)
[facility] INPATIENT:  Family/Significant Other Suicide Prevention Education  Suicide Prevention Education:  Patient Refusal for Family/Significant Other Suicide Prevention Education: The patient Betty Gomez has refused to provide written consent for family/significant other to be provided Family/Significant Other Suicide Prevention Education during admission and/or prior to discharge.  Physician notified.  Farron Lafond T Y[place] Kreitzer 11/17/2018, 3:10 PM

## 2018-11-17 NOTE — [facility] Group Notes (Signed)
[facility] Group Notes:  (Nursing/MHT/Case Management/Adjunct)  Date:  11/17/2018  Time:  2:41 PM  Type of Therapy:  Psychoeducational Skills  Participation Level:  Did Not Attend   Lynelle SmokeCara [facility] View [facility] CenterMadoni 11/17/2018, 2:41 PM

## 2018-11-17 NOTE — Progress Notes (Addendum)
D: [facility]. Patient skin assessment completed with Heather, BHT, skin is intact, with no contraband found. Pt. Was admitted under the services of, Dr. Flora Lipps'Neal.   Pt. During the admissions process is lethargic and appears slightly sedated. The patient's speech is soft, slow, garbled, and tangential. Pt. Thought process is disorganized and frequently changes her story and/or details of her story and it is difficult to identify what is true and what is not accurate. Pt. Is a poor historian overall. Pt. During interviewing requests several times to be given, "valium" and states to this writer that, "the doctor said I could have valium when I got down here", "they haven't been giving me any medications while I was up stairs, it's frustrating". The patient also several times states, "I came here voluntary I don't understand what you mean when you say I've been involuntarily committed". Pt. Given extensive admissions education regarding the involuntary commitment she has been placed under as well as admissions education, but continues during our conversation to ask, "what does it mean court ordered", "I came here voluntarily", "when can I go", "will the doctor order valium for me in the morning". Pt. Needed to be redirected several times during the admissions processing, but was able to be redirected. Pt. Reports she is here, because, "I was brought here voluntarily", then reports she is here to be helped with her anxiety. Pt. Asked about drug and alcohol use and minimizes her use frequently and/or changed her answers when reporting her usage, but does state, "it's because of the Suboxone", referring to why she is presenting lethargic, then states, "It's because they told me I wasn't coming down here, so I went to bed". Pt. Reports eventually that she has been struggling with anxiety, depression, and substance abuse, but reportedly only struggling with  substance abuse, because, "no one has given me medications to control my anxiety". Pt. Does not feel she should be place on IVC, because, "I never said I was having suicidal thoughts, I said that I just wished I would die". Pt. Denies currently si/hi/avh, contracting verbally for safety.     A: After this writer's interviewing with the patient and admissions processing, the patient was oriented to unit/room/call light and given a meal tray. Pt. During this time stated, "will I get a refill of my suboxone before I leave?". Pt. Also requests PRN sleep-aid several times stating, "I know I won't be able to sleep unless you give me something". Patient was encourage to participate in unit activities and continue with plan of care being put into place. Q x 15 minute observation checks were initiated for safety.   R: Patient is partially receptive to initial treatment plan being put into place. Pt. To be monitored utilizing CIWA protocols per MD orders. Pt. Expresses desire to go to a long term rehab facility for treatment.

## 2018-11-17 NOTE — [facility] Group Notes (Signed)
[facility] LCSW Group Therapy Note  Date/Time: 11/17/18, 1300  Type of Therapy/Topic:  Group Therapy:  Balance in Life  Participation Level:  minimal  Description of Group:    This group will address the concept of balance and how it feels and looks when one is unbalanced. Patients will be encouraged to process areas in their lives that are out of balance, and identify reasons for remaining unbalanced. Facilitators will guide patients utilizing problem- solving interventions to address and correct the stressor making their life unbalanced. Understanding and applying boundaries will be explored and addressed for obtaining  and maintaining a balanced life. Patients will be encouraged to explore ways to assertively make their unbalanced needs known to significant others in their lives, using other group members and facilitator for support and feedback.  Therapeutic Goals: 1. Patient will identify two or more emotions or situations they have that consume much of in their lives. 2. Patient will identify signs/triggers that life has become out of balance:  3. Patient will identify two ways to set boundaries in order to achieve balance in their lives:  4. Patient will demonstrate ability to communicate their needs through discussion and/or role plays  Summary of Patient Progress:Pt shared that family and friends are two areas that are currently out of balance.  Pt not very active during discussion regarding positive ways to regain balance in life. Pt appeared to be sleeping at times.           Therapeutic Modalities:   Cognitive Behavioral Therapy Solution-Focused Therapy Assertiveness Training  Daleen SquibbGreg Corrigan Kretschmer, [place]CS[place]
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
## 2018-11-17 NOTE — Plan of Care (Signed)
Patient just recently admitted to the unit. Patient has not had sufficient time to show progressions at this time. Will continue to monitor for progressions.    Problem: Education: Goal: Knowledge of disease or condition will improve Outcome: Not Progressing Goal: Understanding of discharge needs will improve Outcome: Not Progressing   Problem: Health Behavior/Discharge Planning: Goal: Ability to identify changes in lifestyle to reduce recurrence of condition will improve Outcome: Not Progressing Goal: Identification of resources available to assist in meeting health care needs will improve Outcome: Not Progressing   Problem: Physical Regulation: Goal: Complications related to the disease process, condition or treatment will be avoided or minimized Outcome: Not Progressing   Problem: Safety: Goal: Ability to remain free from injury will improve Outcome: Not Progressing   Problem: Self-Concept: Goal: Will verbalize positive feelings about self Outcome: Not Progressing   Problem: Coping: Goal: Coping ability will improve Outcome: Not Progressing   Problem: Medication: Goal: Compliance with prescribed medication regimen will improve Outcome: Not Progressing   Problem: Self-Concept: Goal: Ability to disclose and discuss suicidal ideas will improve Outcome: Not Progressing Goal: Will verbalize positive feelings about self Outcome: Not Progressing

## 2018-11-17 NOTE — Progress Notes (Signed)
Patient this morning when speaking with this Clinical research associatewriter for CIWA assessment is much more organized in her thought process. Pt. Was able to tell me that she reportedly just prior to admission had taken, "three strips of Suboxone, I did some meth, and I took some Klonopins". Pt. Still a bit lethargic this morning, but orientation is x 4.

## 2018-11-18 DIAGNOSIS — F314 Bipolar disorder, current episode depressed, severe, without psychotic features: Secondary | ICD-10-CM

## 2018-11-18 LAB — CBC
HCT: 36.8 % (ref 36.0–46.0)
Hemoglobin: 12 g/dL (ref 12.0–15.0)
MCH: 29.9 pg (ref 26.0–34.0)
MCHC: 32.6 g/dL (ref 30.0–36.0)
MCV: 91.8 fL (ref 80.0–100.0)
Platelets: 231 10*3/uL (ref 150–400)
RBC: 4.01 MIL/uL (ref 3.87–5.11)
RDW: 12.9 % (ref 11.5–15.5)
WBC: 6.3 10*3/uL (ref 4.0–10.5)
nRBC: 0 % (ref 0.0–0.2)

## 2018-11-18 LAB — COMPREHENSIVE METABOLIC PANEL
ALK PHOS: 86 U/L (ref 38–126)
ALT: 201 U/L — ABNORMAL HIGH (ref 0–44)
AST: 89 U/L — ABNORMAL HIGH (ref 15–41)
Albumin: 3.1 g/dL — ABNORMAL LOW (ref 3.5–5.0)
Anion gap: 5 (ref 5–15)
BUN: 9 mg/dL (ref 6–20)
CALCIUM: 8.6 mg/dL — AB (ref 8.9–10.3)
CO2: 28 mmol/L (ref 22–32)
Chloride: 103 mmol/L (ref 98–111)
Creatinine, Ser: 0.82 mg/dL (ref 0.44–1.00)
GFR calc Af Amer: >60 mL/min (ref >60)
GFR calc non Af Amer: >60 mL/min (ref >60)
Glucose, Bld: 92 mg/dL (ref 70–99)
Potassium: 3.7 mmol/L (ref 3.5–5.1)
Sodium: 136 mmol/L (ref 135–145)
Total Bilirubin: 0.5 mg/dL (ref 0.3–1.2)
Total Protein: 5.9 g/dL — ABNORMAL LOW (ref 6.5–8.1)

## 2018-11-18 LAB — HEPATITIS PANEL, ACUTE
HCV Ab: >11 s/co ratio — ABNORMAL HIGH (ref 0.0–0.9)
Hep A IgM: NEGATIVE
Hep B C IgM: NEGATIVE
Hepatitis B Surface Ag: NEGATIVE

## 2018-11-18 LAB — RPR: RPR Ser Ql: NONREACTIVE

## 2018-11-18 LAB — HIV ANTIBODY (ROUTINE TESTING W REFLEX): HIV Screen 4th Generation wRfx: NONREACTIVE

## 2018-11-18 NOTE — Plan of Care (Signed)
Pt. Denies si/hi/avh, contracting verbally for safety. Pt. Is complaint with medications and unit procedures. Pt. Able to verbalize more feelings this evening with an improve thought process. Pt. Reports she can remain safe while on the unit.    Problem: Safety: Goal: Ability to remain free from injury will improve Outcome: Progressing   Problem: Self-Concept: Goal: Will verbalize positive feelings about self Outcome: Progressing   Problem: Medication: Goal: Compliance with prescribed medication regimen will improve Outcome: Progressing   Problem: Self-Concept: Goal: Ability to disclose and discuss suicidal ideas will improve Outcome: Progressing

## 2018-11-18 NOTE — Plan of Care (Signed)
  Problem: Safety: Goal: Ability to remain free from injury will improve Outcome: Progressing   Problem: Self-Concept: Goal: Will verbalize positive feelings about self Outcome: Progressing   Problem: Coping: Goal: Coping ability will improve Outcome: Progressing   Problem: Medication: Goal: Compliance with prescribed medication regimen will improve Outcome: Progressing   Problem: Self-Concept: Goal: Ability to disclose and discuss suicidal ideas will improve Outcome: Progressing

## 2018-11-18 NOTE — Progress Notes (Signed)
D:  Upon arrival to the unit the patient is resting in her room, but shortly after is out walking around. Pt. During this writer's assessment is much more organized then previous nights admission. Pt. During this writer's assessment reports she is currently struggling over the recent loss of her uncle. Pt. Reports her depression and anxiety today are better reporting them as, "medium" in severity. Pt. Continues to deny si/hi/avh, contracting verbally for safety. Reports she never was suicidal. Pt. Reports she is depressed about being away from her children. Pt. Does report today has had a much better day. Pt. Affect flat and mood is depressed and anxious, but pleasant. Pt. Still presenting a bit lethargic. Pt. Expresses she is goal oriented and motivated to be better. Pt. Excited about rehab reportedly. Pt. Attends snacks appropriately.  A: Q x 15 minute observation checks were completed for safety. Patient was provided with education, but needs reinforcement. Patient was given/offered medications per orders. Patient  was encourage to attend groups, participate in unit activities and continue with plan of care. Pt. Chart and plans of care reviewed. Pt. Given support and encouragement.   R: Patient is complaint with medication and unit procedures with direction and encouragement. Pt. Monitored per CIWA scoring per orders. MD orders for urine lab work complete. Pt. Cough this evening not as aggressive, denies any pain. Pt. breathing regular and unlabored.              Precautionary checks every 15 minutes for safety maintained, room free of safety hazards, patient sustains no injury or falls during this shift. Will endorse care to next shift.

## 2018-11-18 NOTE — Progress Notes (Signed)
D: Pt denies SI/HI/AV ;hallucinations. Pt is pleasant and cooperative. Pt states she is depressed because of the death of her uncle. Patient has been in bed most of morning. Patient engages minimal with this Clinical research associatewriter.  A: Pt was offered support and encouragement. Pt was given scheduled medications. Pt was encourage to attend groups. Q 15 minute checks were done for safety.  R:Pt attends groups and interacts well with peers and staff. Pt is taking medication. Pt has no complaints.Pt receptive to treatment and safety maintained on unit.

## 2018-11-18 NOTE — BHH Group Notes (Signed)
LCSW Group Therapy Note  11/18/2018 1:15pm  Type of Therapy and Topic:  Group Therapy:  Healthy Self Image and Positive Change  Participation Level:  Did Not Attend   Description of Group:  In this group, patients will compare and contrast their current "I am...." statements to the visions they identify as desirable for their lives.  Patients discuss fears and how they can make positive changes in their cognitions that will positively impact their behaviors.  Facilitator played a motivational 3-minute speech and patients were left with the task of thinking about what "I am...." statements they can start using in their lives immediately.  Therapeutic Goals: 1. Patient will state their current self-perception as expressed in an "I Am" statement 2. Patient will contrast this with their desired vision for their live 3. Patient will identify 3 fears that negatively impact their behavior 4. Patient will discuss cognitive distortions that stem from their fears 5. Patient will verbalize statements that challenge their cognitive distortions  Summary of Patient Progress:  Pt was invited to attend group but chose not to attend. CSW will continue to encourage pt to attend group throughout their admission.    Therapeutic Modalities Cognitive Behavioral Therapy Motivational Interviewing  Yovan Leeman  CUEBAS-COLON, LCSW 11/18/2018 4:03 PM

## 2018-11-18 NOTE — Progress Notes (Signed)
[employer] - [place] [facility]   PATIENT NAME: Betty Gomez    MR#:  [id number]  DATE OF BIRTH:  22-Apr-1986  SUBJECTIVE:  CHIEF COMPLAINT:  No chief complaint on file.  Patient complains of cough with some sputum. REVIEW OF SYSTEMS:  Review of Systems  Constitutional: Negative for chills, fever and malaise/fatigue.  HENT: Negative for sore throat.   Eyes: Negative for blurred vision and double vision.  Respiratory: Positive for cough. Negative for hemoptysis, shortness of breath, wheezing and stridor.   Cardiovascular: Negative for chest pain, palpitations, orthopnea and leg swelling.  Gastrointestinal: Negative for abdominal pain, blood in stool, diarrhea, melena, nausea and vomiting.  Genitourinary: Negative for dysuria, flank pain and hematuria.  Musculoskeletal: Negative for back pain and joint pain.  Skin: Negative for rash.  Neurological: Negative for dizziness, sensory change, focal weakness, seizures, loss of consciousness, weakness and headaches.  Endo/Heme/Allergies: Negative for polydipsia.  Psychiatric/Behavioral: Negative for depression. The patient is not nervous/anxious.     DRUG ALLERGIES:  No Known Allergies VITALS:  Blood pressure 101/75, pulse 84, temperature 97.8 F (36.6 C), temperature source Oral, resp. rate 16, height 5\' 5"  (1.651 m), weight 67.6 kg, SpO2 94 %, unknown if currently breastfeeding. PHYSICAL EXAMINATION:  Physical Exam Constitutional:      General: She is not in acute distress. HENT:     Head: Normocephalic.     Mouth/Throat:     Mouth: Mucous membranes are moist.  Eyes:     General: No scleral icterus.    Conjunctiva/sclera: Conjunctivae normal.     Pupils: Pupils are equal, round, and reactive to light.  Neck:     Musculoskeletal: Normal range of motion and neck supple.     Vascular: No JVD.     Trachea: No tracheal deviation.  Cardiovascular:     Rate and Rhythm: Normal rate and regular rhythm.   Heart sounds: Normal heart sounds. No murmur. No gallop.   Pulmonary:     Effort: Pulmonary effort is normal. No respiratory distress.     Breath sounds: Normal breath sounds. No wheezing or rales.  Abdominal:     General: Bowel sounds are normal. There is no distension.     Palpations: Abdomen is soft.     Tenderness: There is no abdominal tenderness. There is no rebound.  Musculoskeletal: Normal range of motion.        General: No tenderness.  Skin:    Findings: No erythema or rash.  Neurological:     General: No focal deficit present.     Mental Status: She is alert and oriented to person, place, and time.     Cranial Nerves: No cranial nerve deficit.  Psychiatric:        Mood and Affect: Mood normal.    LABORATORY PANEL:  Female CBC Recent Labs  Lab 11/18/18 0708  WBC 6.3  HGB 12.0  HCT 36.8  PLT 231   ------------------------------------------------------------------------------------------------------------------ Chemistries  Recent Labs  Lab 11/18/18 0708  NA 136  K 3.7  CL 103  CO2 28  GLUCOSE 92  BUN 9  CREATININE 0.82  CALCIUM 8.6*  AST 89*  ALT 201*  ALKPHOS 86  BILITOT 0.5   RADIOLOGY:  No results found. ASSESSMENT AND PLAN:   1.  Pneumonia right middle lobe.   Continue Omnicef and Zithromax for 4 more days.  Robitussin as needed.  2.  Acute cystitis.  Continue antibiotics as above, Follow-up urine culture.  3.  Relative  hypotension.  Patient needs to stay hydrated. 4.  Polysubstance abuse with cocaine, methamphetamine, heroin that she admitted to.  Urine toxicology also positive for cannabis and benzodiazepine. 5.  Tobacco abuse.  Smoking cessation counseling done 4 minutes by me.  Nicotine patch already ordered. 6.  Elevated liver function tests likely secondary to polysubstance abuse.  improving. 7.  Hypokalemia.    Improved.  Medically stable.  Sign off.  All the records are reviewed and case discussed with Care Management/Social  Worker. Management plans discussed with the patient, family and they are in agreement.  CODE STATUS: Full Code  TOTAL TIME TAKING CARE OF THIS PATIENT: 25 minutes.   More than 50% of the time was spent in counseling/coordination of care: YES  POSSIBLE D/C IN ? DAYS, DEPENDING ON CLINICAL CONDITION.   Shaune PollackQing Deziah Renwick M.D on 11/18/2018 at 4:03 PM  Between 7am to 6pm - Pager - [phone]  After 6pm go to [email or web address] - Therapist, nutritionalpassword EPAS [facility]  [facility]

## 2018-11-18 NOTE — Progress Notes (Signed)
D: Pt denies SI/HI/AVH, contracts verbally for safety. Pt. Reports she can remain safe while on the unit. Pt. This evening is isolative and withdrawn to her room and participation is absent and not visible around the unit. Pt is pleasant and cooperative though, and engages when asked questions during this writer's assessment. Pt. Overall during assessments forwards little and is minimal, but appropriate. Pt. has no Complaints, denies pain. Pt. Denies trouble breathing, no respiratory distress noted. Pt. Reports this evening she just misses being home. Pt. Reports again this evening she is eager to participate in outpatient rehab and is excited about the outpatient rehab transportation service being provided. Pt. Reports her anxiety and depression is, "medium", but just about not being home and with her children.   A: Q x 15 minute observation checks were completed for safety. Patient was provided with education, but needs reinforcement.  Patient was given/offered medications per orders. Patient  was encourage to attend groups, participate in unit activities and continue with plan of care. Pt. Chart and plans of care reviewed. Pt. Given support and encouragement.   R: Patient is complaint with medications. Pt. Monitored per CIWA protocols. Pt. Does not attend snack time or group this evening.             Precautionary checks every 15 minutes for safety maintained, room free of safety hazards, patient sustains no injury or falls during this shift. Will endorse care to next shift.

## 2018-11-18 NOTE — Plan of Care (Signed)
Pt. Is complaint with medications. Pt. Denies si/hi/avh, contracts verbally for safety. Pt. Engages with this Clinical research associatewriter during assessments appropriately. Pt. Reports doing better this evening. Pt. Reports she can remain safe while on the unit. Pt. Reports improved coping and is feeling motivated to do better.     Problem: Safety: Goal: Ability to remain free from injury will improve Outcome: Progressing   Problem: Self-Concept: Goal: Will verbalize positive feelings about self Outcome: Progressing   Problem: Coping: Goal: Coping ability will improve Outcome: Progressing   Problem: Medication: Goal: Compliance with prescribed medication regimen will improve Outcome: Progressing   Problem: Self-Concept: Goal: Ability to disclose and discuss suicidal ideas will improve Outcome: Progressing

## 2018-11-18 NOTE — Progress Notes (Signed)
[facility]  11/18/2018 1:22 PM Betty Gomez  MRN:  [id number] Subjective: Patient with bipolar disorder and substance abuse.  Patient seen chart reviewed.  Patient reports that her mood is feeling "fine" but her affect is very flat and withdrawn.  Spends most of her time laying in bed.  She does tell me that she is looking forward to going to group this afternoon.  As far as substance abuse the patient is able to acknowledge the problem at least a little bit.  Not showing any signs of active withdrawal.  Vital signs normal.  Labs were all reviewed and nothing particularly out of the ordinary other than the expected elevated liver function tests.  Toxicology report was finally done and did show multiple drugs positive including both amphetamines and cocaine. Principal Problem: Bipolar disorder, unspecified (HCC) Diagnosis: Principal Problem:   Bipolar disorder, unspecified (HCC) Active Problems:   Alcohol abuse   Substance induced mood disorder (HCC)   Cannabis abuse   Methamphetamine abuse (HCC)   Opioid abuse (HCC)   Tobacco abuse   Polysubstance abuse (HCC)  Total Time spent with patient: 30 minutes  Past Psychiatric History: Past history of substance abuse mood instability behavior problems poor self-care.  Noncompliance and poor insight  Past Medical History:  Past Medical History:  Diagnosis Date  . Anxiety   . Asthma   . Depression    ok, came off meds, has someone she sees for this.  . Endometriosis   . Infection    UTI    Past Surgical History:  Procedure Laterality Date  . DIAGNOSTIC LAPAROSCOPY     endometriosis  . TUBAL LIGATION N/A 09/17/2015   Procedure: POST PARTUM TUBAL LIGATION;  Surgeon: Vena AustriaAndreas Staebler, MD;  Location: [facility] ORS;  Service: Gynecology;  Laterality: N/A;  . TUBAL LIGATION Bilateral 09/21/2016   Procedure: POST PARTUM TUBAL LIGATION;  Surgeon: Nadara Mustardobert P Harris, MD;  Location: [facility] ORS;  Service: Gynecology;  Laterality: Bilateral;    Family History:  Family History  Problem Relation Age of Onset  . Hypertension Mother   . Diabetes Mother   . Heart disease Mother [id number]       failure  . Heart failure Mother   . Depression Father   . Heart disease Maternal Grandfather   . Diabetes Paternal Grandmother   . Hypertension Paternal Grandmother   . Cancer Paternal Grandfather        lung  . Hearing loss Neg Hx    Family Psychiatric  History: See previous note Social History:  Social History   Substance and Sexual Activity  Alcohol Use No     Social History   Substance and Sexual Activity  Drug Use Yes  . Types: Marijuana, Methamphetamines, Heroin    Social History   Socioeconomic History  . Marital status: Married    Spouse name: Not on file  . Number of children: Not on file  . Years of education: Not on file  . Highest education level: Not on file  Occupational History  . Not on file  Social Needs  . Financial resource strain: Not on file  . Food insecurity:    Worry: Not on file    Inability: Not on file  . Transportation needs:    Medical: Not on file    Non-medical: Not on file  Tobacco Use  . Smoking status: Heavy Tobacco Smoker    Packs/day: 0.50    Years: 10.00    Pack years: 5.00  Types: Cigarettes  . Smokeless tobacco: Never Used  Substance and Sexual Activity  . Alcohol use: No  . Drug use: Yes    Types: Marijuana, Methamphetamines, Heroin  . Sexual activity: Yes  Lifestyle  . Physical activity:    Days per week: Not on file    Minutes per session: Not on file  . Stress: Not on file  Relationships  . Social connections:    Talks on phone: Not on file    Gets together: Not on file    Attends religious service: Not on file    Active member of club or organization: Not on file    Attends meetings of clubs or organizations: Not on file    Relationship status: Not on file  Other Topics Concern  . Not on file  Social History Narrative  . Not on file   Additional Social  History:                         Sleep: Fair  Appetite:  Fair  Current Medications: Current Facility-Administered Medications  Medication Dose Route Frequency Provider Last Rate Last Dose  . acetaminophen (TYLENOL) tablet 650 mg  650 mg Oral Q6H PRN Hessie Knows, MD      . albuterol (PROVENTIL HFA;VENTOLIN HFA) 108 (90 Base) MCG/ACT inhaler 1-2 puff  1-2 puff Inhalation Q4H PRN Hessie Knows, MD      . alum & mag hydroxide-simeth (MAALOX/MYLANTA) 200-200-20 MG/5ML suspension 30 mL  30 mL Oral Q4H PRN Aristides Luckey T, MD      . ARIPiprazole (ABILIFY) tablet 10 mg  10 mg Oral Daily Tephanie Escorcia, Jackquline Denmark, MD   10 mg at 11/18/18 0848  . azithromycin (ZITHROMAX) tablet 250 mg  250 mg Oral Daily Alford Highland, MD   250 mg at 11/18/18 0848  . cefdinir (OMNICEF) capsule 300 mg  300 mg Oral Q12H Alford Highland, MD   300 mg at 11/18/18 0848  . folic acid (FOLVITE) tablet 1 mg  1 mg Oral Daily Sokha Craker, Jackquline Denmark, MD   1 mg at 11/18/18 0848  . gabapentin (NEURONTIN) capsule 300 mg  300 mg Oral BID Hessie Knows, MD   300 mg at 11/18/18 0848  . guaiFENesin (ROBITUSSIN) 100 MG/5ML solution 100 mg  5 mL Oral Q4H PRN Hessie Knows, MD      . hydrOXYzine (ATARAX/VISTARIL) tablet 25 mg  25 mg Oral TID PRN Ivah Girardot T, MD      . loperamide (IMODIUM) capsule 2 mg  2 mg Oral PRN Hessie Knows, MD      . LORazepam (ATIVAN) injection 1 mg  1 mg Intramuscular Q6H PRN Hessie Knows, MD       Or  . LORazepam (ATIVAN) tablet 1 mg  1 mg Oral Q6H PRN Hessie Knows, MD      . LORazepam (ATIVAN) injection 2 mg  2 mg Intramuscular BID PRN Hessie Knows, MD      . magnesium hydroxide (MILK OF MAGNESIA) suspension 30 mL  30 mL Oral Daily PRN Augusten Lipkin T, MD      . multivitamin with minerals tablet 1 tablet  1 tablet Oral Daily Dajon Lazar, Jackquline Denmark, MD   1 tablet at 11/18/18 0848  . nicotine (NICODERM CQ - dosed in mg/24 hours) patch 14 mg  14 mg Transdermal Daily Voncille Simm, Jackquline Denmark, MD   14 mg at 11/18/18  0849  . thiamine (VITAMIN B-1) tablet 100 mg  100 mg Oral Daily  Hessie Knows, MD   100 mg at 11/18/18 0848  . traZODone (DESYREL) tablet 100 mg  100 mg Oral QHS PRN Marca Gadsby, Jackquline Denmark, MD   100 mg at 11/17/18 2150    Lab Results:  Results for orders placed or performed during the hospital encounter of 11/17/18 (from the past 48 hour(s))  Urinalysis, Complete w Microscopic     Status: Abnormal   Collection Time: 11/17/18  1:01 AM  Result Value Ref Range   Color, Urine YELLOW (A) YELLOW   APPearance CLOUDY (A) CLEAR   Specific Gravity, Urine 1.016 1.005 - 1.030   pH 5.0 5.0 - 8.0   Glucose, UA NEGATIVE NEGATIVE mg/dL   Hgb urine dipstick MODERATE (A) NEGATIVE   Bilirubin Urine NEGATIVE NEGATIVE   Ketones, ur NEGATIVE NEGATIVE mg/dL   Protein, ur NEGATIVE NEGATIVE mg/dL   Nitrite NEGATIVE NEGATIVE   Leukocytes, UA MODERATE (A) NEGATIVE   RBC / HPF 21-50 0 - 5 RBC/hpf   WBC, UA >50 (H) 0 - 5 WBC/hpf   Bacteria, UA NONE SEEN NONE SEEN   Squamous Epithelial / LPF 11-20 0 - 5   Mucus PRESENT     Comment: Performed at [facility], [address] [place], [place] [address]  Urine Drug Screen, Qualitative ([facility] only)     Status: Abnormal   Collection Time: 11/17/18  1:01 AM  Result Value Ref Range   Tricyclic, Ur Screen NONE DETECTED NONE DETECTED   Amphetamines, Ur Screen POSITIVE (A) NONE DETECTED   MDMA (Ecstasy)Ur Screen NONE DETECTED NONE DETECTED   Cocaine Metabolite,Ur [place] POSITIVE (A) NONE DETECTED   Opiate, Ur Screen NONE DETECTED NONE DETECTED   Phencyclidine (PCP) Ur S NONE DETECTED NONE DETECTED   Cannabinoid 50 Ng, Ur [place] POSITIVE (A) NONE DETECTED   Barbiturates, Ur Screen NONE DETECTED NONE DETECTED   Benzodiazepine, Ur Scrn POSITIVE (A) NONE DETECTED   Methadone Scn, Ur NONE DETECTED NONE DETECTED    Comment: (NOTE) Tricyclics + metabolites, urine    Cutoff 1000 ng/mL Amphetamines + metabolites, urine  Cutoff 1000 ng/mL MDMA (Ecstasy), urine               Cutoff 500 ng/mL Cocaine Metabolite, urine          Cutoff 300 ng/mL Opiate + metabolites, urine        Cutoff 300 ng/mL Phencyclidine (PCP), urine         Cutoff 25 ng/mL Cannabinoid, urine                 Cutoff 50 ng/mL Barbiturates + metabolites, urine  Cutoff 200 ng/mL Benzodiazepine, urine              Cutoff 200 ng/mL Methadone, urine                   Cutoff 300 ng/mL The urine drug screen provides only a preliminary, unconfirmed analytical test result and should not be used for non-medical purposes. Clinical consideration and professional judgment should be applied to any positive drug screen result due to possible interfering substances. A more specific alternate chemical method must be used in order to obtain a confirmed analytical result. Gas chromatography / mass spectrometry (GC/MS) is the preferred confirmat ory method. Performed at [facility], [address] [place], [place] [address]   Pregnancy, urine     Status: None   Collection Time: 11/17/18  1:01 AM  Result Value Ref Range   Preg Test, Ur NEGATIVE  NEGATIVE    Comment: Performed at [facility], [address] [place], [place] [address]  Chlamydia/NGC rt PCR [facility] only)     Status: None   Collection Time: 11/17/18  1:01 AM  Result Value Ref Range   Specimen source GC/Chlam URINE, RANDOM    Chlamydia Tr NOT DETECTED NOT DETECTED   N gonorrhoeae NOT DETECTED NOT DETECTED    Comment: (NOTE) This CT/NG assay has not been evaluated in patients with a history of  hysterectomy. Performed at [facility], [address] [place], [place] [address]   HIV Antibody (routine testing w rflx)     Status: None   Collection Time: 11/17/18  2:02 PM  Result Value Ref Range   HIV Screen 4th Generation wRfx Non Reactive Non Reactive    Comment: (NOTE) Performed At: [facility] [address] [place], [place] [id number] Jolene Schimke MD [id number]   Hepatitis panel, acute      Status: Abnormal   Collection Time: 11/17/18  2:02 PM  Result Value Ref Range   Hepatitis B Surface Ag Negative Negative   HCV Ab >11.0 (H) 0.0 - 0.9 s/co ratio    Comment: (NOTE)                                  Negative:     < 0.8                             Indeterminate: 0.8 - 0.9                                  Positive:     > 0.9 The CDC recommends that a positive HCV antibody result be followed up with a HCV Nucleic Acid Amplification test (784696). Performed At:  [facility] [address] [place], [place] [id number] Jolene Schimke MD [id number]    Hep A IgM Negative Negative   Hep B C IgM Negative Negative  RPR     Status: None   Collection Time: 11/17/18  2:02 PM  Result Value Ref Range   RPR Ser Ql Non Reactive Non Reactive    Comment: (NOTE) Performed At: [facility] [address] [place], [place] [id number] Jolene Schimke MD [id number]   Comprehensive metabolic panel     Status: Abnormal   Collection Time: 11/18/18  7:08 AM  Result Value Ref Range   Sodium 136 135 - 145 mmol/L   Potassium 3.7 3.5 - 5.1 mmol/L   Chloride 103 98 - 111 mmol/L   CO2 28 22 - 32 mmol/L   Glucose, Bld 92 70 - 99 mg/dL   BUN 9 6 - 20 mg/dL   Creatinine, Ser 9.51 0.44 - 1.00 mg/dL   Calcium 8.6 (L) 8.9 - 10.3 mg/dL   Total Protein 5.9 (L) 6.5 - 8.1 g/dL   Albumin 3.1 (L) 3.5 - 5.0 g/dL   AST 89 (H) 15 - 41 U/L   ALT 201 (H) 0 - 44 U/L   Alkaline Phosphatase 86 38 - 126 U/L   Total Bilirubin 0.5 0.3 - 1.2 mg/dL   GFR calc non Af Amer >60 >60 mL/min   GFR calc Af Amer >60 >60 mL/min   Anion gap 5 5 - 15    Comment: Performed at [facility],  [address], [place], [place] [address]  CBC     Status: None   Collection Time: 11/18/18  7:08 AM  Result Value Ref Range   WBC 6.3 4.0 - 10.5 K/uL   RBC 4.01 3.87 - 5.11 MIL/uL   Hemoglobin 12.0 12.0 - 15.0 g/dL   HCT 60.4 54.0 - 98.1 %   MCV 91.8 80.0 - 100.0 fL   MCH 29.9 26.0 - 34.0 pg   MCHC  32.6 30.0 - 36.0 g/dL   RDW 19.1 47.8 - 29.5 %   Platelets 231 150 - 400 K/uL   nRBC 0.0 0.0 - 0.2 %    Comment: Performed at [facility], [address] [place], [place] [address]    Blood Alcohol level:  Lab Results  Component Value Date   ETH 12 (H) 11/16/2018   ETH <10 09/23/2017    Metabolic Disorder Labs: Lab Results  Component Value Date   HGBA1C 5.0 11/16/2018   MPG 96.8 11/16/2018   No results found for: PROLACTIN Lab Results  Component Value Date   CHOL 108 11/16/2018   TRIG 54 11/16/2018   HDL 52 11/16/2018   CHOLHDL 2.1 11/16/2018   VLDL 11 11/16/2018   LDLCALC 45 11/16/2018    Physical Findings: AIMS:  , ,  ,  ,    CIWA:  CIWA-Ar Total: 0 COWS:     Musculoskeletal: Strength & Muscle Tone: within normal limits Gait & Station: normal Patient leans: N/A  Psychiatric Specialty Exam: Physical Exam  Nursing note and vitals reviewed. Constitutional: She appears well-developed and well-nourished.  HENT:  Head: Normocephalic and atraumatic.  Eyes: Pupils are equal, round, and reactive to light. Conjunctivae are normal.  Neck: Normal range of motion.  Cardiovascular: Regular rhythm and normal heart sounds.  Respiratory: Effort normal. No respiratory distress.  GI: Soft.  Musculoskeletal: Normal range of motion.  Neurological: She is alert.  Skin: Skin is warm and dry.  Psychiatric: Judgment normal. Her affect is blunt. Her speech is delayed. She is slowed. Cognition and memory are impaired. She expresses no suicidal ideation.    Review of Systems  Constitutional: Negative.   HENT: Negative.   Eyes: Negative.   Respiratory: Negative.   Cardiovascular: Negative.   Gastrointestinal: Negative.   Musculoskeletal: Negative.   Skin: Negative.   Neurological: Negative.   Psychiatric/Behavioral: Positive for depression, memory loss and substance abuse. Negative for hallucinations and suicidal ideas. The patient is nervous/anxious and has  insomnia.     Blood pressure 101/75, pulse 84, temperature 97.8 F (36.6 C), temperature source Oral, resp. rate 16, height 5\' 5"  (1.651 m), weight 67.6 kg, SpO2 94 %, unknown if currently breastfeeding.Body mass index is 24.79 kg/m.  General Appearance: Disheveled  Eye Contact:  Minimal  Speech:  Slow  Volume:  Decreased  Mood:  Depressed  Affect:  Constricted  Thought Process:  Coherent  Orientation:  Full (Time, Place, and Person)  Thought Content:  Logical  Suicidal Thoughts:  No  Homicidal Thoughts:  No  Memory:  Immediate;   Fair Recent;   Fair Remote;   Fair  Judgement:  Impaired  Insight:  Lacking  Psychomotor Activity:  Decreased  Concentration:  Concentration: Poor  Recall:  [employer] of Knowledge:  Fair  Language:  Fair  Akathisia:  No  Handed:  Right  AIMS (if indicated):     Assets:  Desire for Improvement Housing Physical Health Social Support  ADL's:  Impaired  Cognition:  WNL  Sleep:  Number of Hours: 7.45     Treatment Plan Summary: Daily contact with patient to assess and evaluate symptoms and progress in treatment, Medication management and Plan Patient tolerating medicine for bipolar disorder which is currently primarily Abilify with trazodone for sleep and as needed medicine.  Not showing signs of active physical withdrawal.  Psychologically still very down and withdrawn as would be expected from coming off of the stimulant abuse.  Psychoeducation completed.  Encourage patient to attend groups.  No change in medication plan for today.  Mordecai Rasmussen, MD 11/18/2018, 1:22 PM

## 2018-11-19 LAB — URINE CULTURE: Special Requests: NORMAL

## 2018-11-19 NOTE — Plan of Care (Signed)
Patient is minimal during assessment. Patient denies SI/HI/AVH. Patient isolates to self in room. Patient is without complaint. Patient denies depression and anxiety at this time. Patient did not complete a self inventory sheet today. Patient adheres to scheduled medications.    Problem: Safety: Goal: Ability to remain free from injury will improve Outcome: Progressing   Problem: Medication: Goal: Compliance with prescribed medication regimen will improve Outcome: Progressing   Problem: Self-Concept: Goal: Ability to disclose and discuss suicidal ideas will improve Outcome: Progressing   Problem: Education: Goal: Knowledge of disease or condition will improve Outcome: Not Progressing Goal: Understanding of discharge needs will improve Outcome: Not Progressing   Problem: Self-Concept: Goal: Will verbalize positive feelings about self Outcome: Not Progressing

## 2018-11-19 NOTE — Plan of Care (Signed)
Pt. Denies si/hi/avh, contracts for safety. Pt. Reports she can remain safe while on the unit. Pt. Is complaint with medications.   Problem: Safety: Goal: Ability to remain free from injury will improve Outcome: Progressing   Problem: Medication: Goal: Compliance with prescribed medication regimen will improve Outcome: Progressing   Problem: Self-Concept: Goal: Ability to disclose and discuss suicidal ideas will improve Outcome: Progressing

## 2018-11-19 NOTE — Progress Notes (Signed)
D: Pt denies SI/HI/AVH, contracts verbally for safety. Pt is pleasant and cooperative, engages appropriately this evening during assessments. Pt. Affect is mostly flat, but appropriate, smiles occasionally. Pt. has no Complaints, but does continue to report she misses home. Denies depression and reports anxiety about missing home. Pt. Given sleep-aid and PRN medication for anxiety. Pt. Interactions with staff and peers appropriate. Pt. Attends snack and group appropriate. Pt. Eating good. Reports today was a good day. No behavioral concerns to report.   A: Q x 15 minute observation checks were completed for safety. Patient was provided with education.  Patient was given/offered medications per orders. Patient  was encourage to attend groups, participate in unit activities and continue with plan of care. Pt. Chart and plans of care reviewed. Pt. Given support and encouragement.   R: Patient is complaint with medication and unit procedures with direction and encouragement. Pt. Monitored per CIWA scoring. Pt. Denies pain and or trouble breathing. Pt. Reports improvement in cough.             Precautionary checks every 15 minutes for safety maintained, room free of safety hazards, patient sustains no injury or falls during this shift. Will endorse care to next shift.

## 2018-11-19 NOTE — [facility] Group Notes (Signed)
LCSW Group Therapy Note 11/19/2018 1:15pm  Type of Therapy and Topic: Group Therapy: Feelings Around Returning Home & Establishing a Supportive Framework and Supporting Oneself When Supports Not Available  Participation Level: Active  Description of Group:  Patients first processed thoughts and feelings about upcoming discharge. These included fears of upcoming changes, lack of change, new living environments, judgements and expectations from others and overall stigma of mental health issues. The group then discussed the definition of a supportive framework, what that looks and feels like, and how do to discern it from an unhealthy non-supportive network. The group identified different types of supports as well as what to do when your family/friends are less than helpful or unavailable  Therapeutic Goals  1. Patient will identify one healthy supportive network that they can use at discharge. 2. Patient will identify one factor of a supportive framework and how to tell it from an unhealthy network. 3. Patient able to identify one coping skill to use when they do not have positive supports from others. 4. Patient will demonstrate ability to communicate their needs through discussion and/or role plays.  Summary of Patient Progress:  The patient reported she feels "good, ready to go home." Pt engaged during group session. As patients processed their anxiety about discharge and described healthy supports patient shared she is ready to be discharge. Patient listed her parents and children as her main support.  Patients identified at least one self-care tool they were willing to use after discharge; listening to music.   Therapeutic Modalities Cognitive Behavioral Therapy Motivational Interviewing   Randall Rampersad  CUEBAS-COLON, LCSW 11/19/2018 11:42 AM

## 2018-11-19 NOTE — Progress Notes (Signed)
[facility] MD Progress Note  11/19/2018 11:33 AM Betty Gomez  MRN:  [id number] Subjective: Follow-up for this patient with substance abuse and bipolar disorder.  Patient seen chart reviewed.  Vital signs all normal.  Patient reports that she is feeling better.  Mood is more stable.  Not feeling depressed or suicidal.  No sign of delirium.  Withdrawing without difficulty starting to eat better.  Still stays in bed a lot with poor hygiene.  No sign of acute psychosis Principal Problem: Bipolar disorder, unspecified (HCC) Diagnosis: Principal Problem:   Bipolar disorder, unspecified (HCC) Active Problems:   Alcohol abuse   Substance induced mood disorder (HCC)   Cannabis abuse   Methamphetamine abuse (HCC)   Opioid abuse (HCC)   Tobacco abuse   Polysubstance abuse (HCC)  Total Time spent with patient: 20 minutes  Past Psychiatric History: Past history of substance abuse mood instability behavior problems chronic noncompliance  Past Medical History:  Past Medical History:  Diagnosis Date  . Anxiety   . Asthma   . Depression    ok, came off meds, has someone she sees for this.  . Endometriosis   . Infection    UTI    Past Surgical History:  Procedure Laterality Date  . DIAGNOSTIC LAPAROSCOPY     endometriosis  . TUBAL LIGATION N/A 09/17/2015   Procedure: POST PARTUM TUBAL LIGATION;  Surgeon: Vena Austria, MD;  Location: [facility];  Service: Gynecology;  Laterality: N/A;  . TUBAL LIGATION Bilateral 09/21/2016   Procedure: POST PARTUM TUBAL LIGATION;  Surgeon: Nadara Mustard, MD;  Location: [facility];  Service: Gynecology;  Laterality: Bilateral;   Family History:  Family History  Problem Relation Age of Onset  . Hypertension Mother   . Diabetes Mother   . Heart disease Mother 26       failure  . Heart failure Mother   . Depression Father   . Heart disease Maternal Grandfather   . Diabetes Paternal Grandmother   . Hypertension Paternal Grandmother   . Cancer Paternal  Grandfather        lung  . Hearing loss Neg Hx    Family Psychiatric  History: See previous note Social History:  Social History   Substance and Sexual Activity  Alcohol Use No     Social History   Substance and Sexual Activity  Drug Use Yes  . Types: Marijuana, Methamphetamines, Heroin    Social History   Socioeconomic History  . Marital status: Married    Spouse name: Not on file  . Number of children: Not on file  . Years of education: Not on file  . Highest education level: Not on file  Occupational History  . Not on file  Social Needs  . Financial resource strain: Not on file  . Food insecurity:    Worry: Not on file    Inability: Not on file  . Transportation needs:    Medical: Not on file    Non-medical: Not on file  Tobacco Use  . Smoking status: Heavy Tobacco Smoker    Packs/day: 0.50    Years: 10.00    Pack years: 5.00    Types: Cigarettes  . Smokeless tobacco: Never Used  Substance and Sexual Activity  . Alcohol use: No  . Drug use: Yes    Types: Marijuana, Methamphetamines, Heroin  . Sexual activity: Yes  Lifestyle  . Physical activity:    Days per week: Not on file    Minutes per session: Not  on file  . Stress: Not on file  Relationships  . Social connections:    Talks on phone: Not on file    Gets together: Not on file    Attends religious service: Not on file    Active member of club or organization: Not on file    Attends meetings of clubs or organizations: Not on file    Relationship status: Not on file  Other Topics Concern  . Not on file  Social History Narrative  . Not on file   Additional Social History:                         Sleep: Good  Appetite:  Fair  Current Medications: Current Facility-Administered Medications  Medication Dose Route Frequency Provider Last Rate Last Dose  . acetaminophen (TYLENOL) tablet 650 mg  650 mg Oral Q6H PRN Hessie Knows, MD      . albuterol (PROVENTIL HFA;VENTOLIN HFA) 108  (90 Base) MCG/ACT inhaler 1-2 puff  1-2 puff Inhalation Q4H PRN Hessie Knows, MD      . alum & mag hydroxide-simeth (MAALOX/MYLANTA) 200-200-20 MG/5ML suspension 30 mL  30 mL Oral Q4H PRN Zykiria Bruening T, MD      . ARIPiprazole (ABILIFY) tablet 10 mg  10 mg Oral Daily Berlin Viereck, Jackquline Denmark, MD   10 mg at 11/19/18 0854  . azithromycin (ZITHROMAX) tablet 250 mg  250 mg Oral Daily Alford Highland, MD   250 mg at 11/19/18 0854  . cefdinir (OMNICEF) capsule 300 mg  300 mg Oral Q12H Alford Highland, MD   300 mg at 11/18/18 2048  . folic acid (FOLVITE) tablet 1 mg  1 mg Oral Daily Sibley Rolison, Jackquline Denmark, MD   1 mg at 11/19/18 0854  . gabapentin (NEURONTIN) capsule 300 mg  300 mg Oral BID Hessie Knows, MD   300 mg at 11/19/18 0854  . guaiFENesin (ROBITUSSIN) 100 MG/5ML solution 100 mg  5 mL Oral Q4H PRN Hessie Knows, MD      . hydrOXYzine (ATARAX/VISTARIL) tablet 25 mg  25 mg Oral TID PRN Jamaurie Bernier, Jackquline Denmark, MD   25 mg at 11/18/18 1718  . loperamide (IMODIUM) capsule 2 mg  2 mg Oral PRN Hessie Knows, MD      . LORazepam (ATIVAN) injection 1 mg  1 mg Intramuscular Q6H PRN Hessie Knows, MD       Or  . LORazepam (ATIVAN) tablet 1 mg  1 mg Oral Q6H PRN Hessie Knows, MD      . LORazepam (ATIVAN) injection 2 mg  2 mg Intramuscular BID PRN Hessie Knows, MD      . magnesium hydroxide (MILK OF MAGNESIA) suspension 30 mL  30 mL Oral Daily PRN Cloyde Oregel T, MD      . multivitamin with minerals tablet 1 tablet  1 tablet Oral Daily Daveon Arpino, Jackquline Denmark, MD   1 tablet at 11/19/18 0854  . nicotine (NICODERM CQ - dosed in mg/24 hours) patch 14 mg  14 mg Transdermal Daily Maximino Cozzolino, Jackquline Denmark, MD   14 mg at 11/19/18 0856  . thiamine (VITAMIN B-1) tablet 100 mg  100 mg Oral Daily Hessie Knows, MD   100 mg at 11/19/18 0854  . traZODone (DESYREL) tablet 100 mg  100 mg Oral QHS PRN Disa Riedlinger, Jackquline Denmark, MD   100 mg at 11/17/18 2150    Lab Results:  Results for orders placed or performed during the hospital encounter of 11/17/18  (from the  past 48 hour(s))  HIV Antibody (routine testing w rflx)     Status: None   Collection Time: 11/17/18  2:02 PM  Result Value Ref Range   HIV Screen 4th Generation wRfx Non Reactive Non Reactive    Comment: (NOTE) Performed At: [facility] [address] [place], [place] [id number] Jolene Schimke MD [id number]   Hepatitis panel, acute     Status: Abnormal   Collection Time: 11/17/18  2:02 PM  Result Value Ref Range   Hepatitis B Surface Ag Negative Negative   HCV Ab >11.0 (H) 0.0 - 0.9 s/co ratio    Comment: (NOTE)                                  Negative:     < 0.8                             Indeterminate: 0.8 - 0.9                                  Positive:     > 0.9 The CDC recommends that a positive HCV antibody result be followed up with a HCV Nucleic Acid Amplification test (784696). Performed At: [facility] [address] [place], [place] [id number] Jolene Schimke MD [id number]    Hep A IgM Negative Negative   Hep B C IgM Negative Negative  RPR     Status: None   Collection Time: 11/17/18  2:02 PM  Result Value Ref Range   RPR Ser Ql Non Reactive Non Reactive    Comment: (NOTE) Performed At: [facility] [address] [place], [place] [id number] Jolene Schimke MD [id number]   Comprehensive metabolic panel     Status: Abnormal   Collection Time: 11/18/18  7:08 AM  Result Value Ref Range   Sodium 136 135 - 145 mmol/L   Potassium 3.7 3.5 - 5.1 mmol/L   Chloride 103 98 - 111 mmol/L   CO2 28 22 - 32 mmol/L   Glucose, Bld 92 70 - 99 mg/dL   BUN 9 6 - 20 mg/dL   Creatinine, Ser 9.51 0.44 - 1.00 mg/dL   Calcium 8.6 (L) 8.9 - 10.3 mg/dL   Total Protein 5.9 (L) 6.5 - 8.1 g/dL   Albumin 3.1 (L) 3.5 - 5.0 g/dL   AST 89 (H) 15 - 41 U/L   ALT 201 (H) 0 - 44 U/L   Alkaline Phosphatase 86 38 - 126 U/L   Total Bilirubin 0.5 0.3 - 1.2 mg/dL   GFR calc non Af Amer >60 >60 mL/min   GFR calc Af Amer >60 >60 mL/min   Anion gap 5  5 - 15    Comment: Performed at [facility], [address] [place], [place] [address]  CBC     Status: None   Collection Time: 11/18/18  7:08 AM  Result Value Ref Range   WBC 6.3 4.0 - 10.5 K/uL   RBC 4.01 3.87 - 5.11 MIL/uL   Hemoglobin 12.0 12.0 - 15.0 g/dL   HCT 60.6 30.1 - 60.1 %   MCV 91.8 80.0 - 100.0 fL   MCH 29.9 26.0 - 34.0 pg   MCHC 32.6 30.0 - 36.0 g/dL   RDW 09.3 23.5 - 57.3 %   Platelets 231  150 - 400 K/uL   nRBC 0.0 0.0 - 0.2 %    Comment: Performed at [facility], [address] [place], [place] [id number]    Blood Alcohol level:  Lab Results  Component Value Date   ETH 12 (H) 11/16/2018   ETH <10 09/23/2017    Metabolic Disorder Labs: Lab Results  Component Value Date   HGBA1C 5.0 11/16/2018   MPG 96.8 11/16/2018   No results found for: PROLACTIN Lab Results  Component Value Date   CHOL 108 11/16/2018   TRIG 54 11/16/2018   HDL 52 11/16/2018   CHOLHDL 2.1 11/16/2018   VLDL 11 11/16/2018   LDLCALC 45 11/16/2018    Physical Findings: AIMS:  , ,  ,  ,    CIWA:  CIWA-Ar Total: 0 COWS:     Musculoskeletal: Strength & Muscle Tone: within normal limits Gait & Station: normal Patient leans: N/A  Psychiatric Specialty Exam: Physical Exam  Nursing note and vitals reviewed. Constitutional: She appears well-developed and well-nourished.  HENT:  Head: Normocephalic and atraumatic.  Eyes: Pupils are equal, round, and reactive to light. Conjunctivae are normal.  Neck: Normal range of motion.  Cardiovascular: Regular rhythm and normal heart sounds.  Respiratory: Effort normal. No respiratory distress.  GI: Soft.  Musculoskeletal: Normal range of motion.  Neurological: She is alert.  Skin: Skin is warm and dry.  Psychiatric: Judgment normal. Her affect is blunt. Her speech is delayed. She is slowed. Cognition and memory are normal. She expresses no homicidal and no suicidal ideation.    Review of Systems  Constitutional:  Negative.   HENT: Negative.   Eyes: Negative.   Respiratory: Negative.   Cardiovascular: Negative.   Gastrointestinal: Negative.   Musculoskeletal: Negative.   Skin: Negative.   Neurological: Negative.   Psychiatric/Behavioral: Negative.     Blood pressure (!) 102/58, pulse 89, temperature 98.1 F (36.7 C), temperature source Oral, resp. rate 18, height 5\' 5"  (1.651 m), weight 67.6 kg, SpO2 95 %, unknown if currently breastfeeding.Body mass index is 24.79 kg/m.  General Appearance: Disheveled  Eye Contact:  Fair  Speech:  Slow  Volume:  Decreased  Mood:  Euthymic  Affect:  Constricted  Thought Process:  Goal Directed  Orientation:  Full (Time, Place, and Person)  Thought Content:  Logical  Suicidal Thoughts:  No  Homicidal Thoughts:  No  Memory:  Immediate;   Fair Recent;   Fair Remote;   Fair  Judgement:  Fair  Insight:  Fair  Psychomotor Activity:  Decreased  Concentration:  Concentration: Fair  Recall:  [employer]Fair  Fund of Knowledge:  Fair  Language:  Fair  Akathisia:  No  Handed:  Right  AIMS (if indicated):     Assets:  Desire for Improvement Housing Physical Health  ADL's:  Intact  Cognition:  WNL  Sleep:  Number of Hours: 7.45     Treatment Plan Summary: Daily contact with patient to assess and evaluate symptoms and progress in treatment, Medication management and Plan Supportive counseling no change of treatment.  No change to medicine.  Encourage patient to be up out of bed and attend some groups today.  Supportive counseling.  No change to any medical plan  Mordecai RasmussenJohn Abcde Oneil, MD 11/19/2018, 11:33 AM

## 2018-11-20 MED ORDER — FLUOXETINE HCL 10 MG PO CAPS
10.0000 mg | ORAL_CAPSULE | Freq: Every day | ORAL | Status: DC
  Administered 2018-11-20 – 2018-11-21 (×2): 10 mg via ORAL
  Filled 2018-11-20 (×2): qty 1

## 2018-11-20 MED ORDER — FLUOXETINE HCL 20 MG PO CAPS: 20.0000 mg | ORAL_CAPSULE | Freq: Every day | ORAL | Status: DC

## 2018-11-20 NOTE — Plan of Care (Signed)
Patient is alert and oriented, denies SI, HI and AVH. Patient is isolative, stays in her room most of the time. Patient takes medications appropriately. Patient states,"I am just ready to go home." Patient is not having any self harm behaviors. Safety checks Q 15 minutes to continue. Problem: Education: Goal: Knowledge of disease or condition will improve Outcome: Progressing Goal: Understanding of discharge needs will improve Outcome: Progressing   Problem: Health Behavior/Discharge Planning: Goal: Ability to identify changes in lifestyle to reduce recurrence of condition will improve Outcome: Progressing Goal: Identification of resources available to assist in meeting health care needs will improve Outcome: Progressing

## 2018-11-20 NOTE — Progress Notes (Addendum)
[facility]  11/20/2018 12:05 PM [facility]  MRN:  [id number] Subjective: Follow-up for this patient with substance abuse and bipolar disorder.  Patient seen chart reviewed.  Patient reports she's doing well today.  She's hopeful to be discharged soon so she can see her children.  Pt denies any active withdrawal symptoms.  She reports her mood is good.  Denies anxiety or depressive symptoms.  She plans to call her public defender today to see if she still has to report to court or jail once she's discharged from the hospital.  Pt states her public defender is "[phone]".  Pt spoke with Lorella NimrodHarvey from [employer].  Pt is hopeful to start SAIOP.   Pt denies SI, HI, AH, VH, and paranoia.   Still on antibiotics for pneumonia and acute cystitis.   Later in the day, pt complained of feeling sad over her current life stressors--finding her uncle dead from possible overdose and separation from spouse.  Pt denies. She does believe she needs to be on a medication for her depression.  Pt tearful when discussing her stressors.  Pt states she's been on zoloft, celexa and prozac before and prozac worked the best.  She was never able to stay on the antidepressants for very long b/c she states she would get pregnant and discontinue her psych meds.   Pt would like to try prozac for her anxiety and mood.     Principal Problem: Bipolar disorder, unspecified (HCC) Diagnosis: Principal Problem:   Bipolar disorder, unspecified (HCC) Active Problems:   Alcohol abuse   Substance induced mood disorder (HCC)   Cannabis abuse   Methamphetamine abuse (HCC)   Opioid abuse (HCC)   Tobacco abuse   Polysubstance abuse (HCC)  Total Time spent with patient, reviewing her chart, discussing plan of care with her treatment team, and coordinating care: 35 min  Past Psychiatric History: was getting suboxone for past year up to 2 weeks ago [facility]; denies prior psych admissions or inpt  substance abuse rehab programs.  Reports hx of bipolar depression and anxiety-states klonopin and abilify worked well for her.  Hx of childhood ADHD per pt.  Denies hx of suicide attempts or self-injurious behaviors.    Past Medical History:  Past Medical History:  Diagnosis Date  . Anxiety   . Asthma   . Depression    ok, came off meds, has someone she sees for this.  . Endometriosis   . Infection    UTI    Past Surgical History:  Procedure Laterality Date  . DIAGNOSTIC LAPAROSCOPY     endometriosis  . TUBAL LIGATION N/A 09/17/2015   Procedure: POST PARTUM TUBAL LIGATION;  Surgeon: Vena AustriaAndreas Staebler, MD;  Location: [facility] ORS;  Service: Gynecology;  Laterality: N/A;  . TUBAL LIGATION Bilateral 09/21/2016   Procedure: POST PARTUM TUBAL LIGATION;  Surgeon: Nadara Mustardobert P Harris, MD;  Location: [facility] ORS;  Service: Gynecology;  Laterality: Bilateral;   Family History:  Family History  Problem Relation Age of Onset  . Hypertension Mother   . Diabetes Mother   . Heart disease Mother [id number]       failure  . Heart failure Mother   . Depression Father   . Heart disease Maternal Grandfather   . Diabetes Paternal Grandmother   . Hypertension Paternal Grandmother   . Cancer Paternal Grandfather        lung  . Hearing loss Neg Hx    Family Psychiatric  History: pt states "  everybody in my family are addicts".  Pt states her uncle overdoses on drugs.  Social History:  Social History   Substance and Sexual Activity  Alcohol Use No     Social History   Substance and Sexual Activity  Drug Use Yes  . Types: Marijuana, Methamphetamines, Heroin    Social History   Socioeconomic History  . Marital status: Married    Spouse name: Not on file  . Number of children: Not on file  . Years of education: Not on file  . Highest education level: Not on file  Occupational History  . Not on file  Social Needs  . Financial resource strain: Not on file  . Food insecurity:    Worry: Not on file     Inability: Not on file  . Transportation needs:    Medical: Not on file    Non-medical: Not on file  Tobacco Use  . Smoking status: Heavy Tobacco Smoker    Packs/day: 0.50    Years: 10.00    Pack years: 5.00    Types: Cigarettes  . Smokeless tobacco: Never Used  Substance and Sexual Activity  . Alcohol use: No  . Drug use: Yes    Types: Marijuana, Methamphetamines, Heroin  . Sexual activity: Yes  Lifestyle  . Physical activity:    Days per week: Not on file    Minutes per session: Not on file  . Stress: Not on file  Relationships  . Social connections:    Talks on phone: Not on file    Gets together: Not on file    Attends religious service: Not on file    Active member of club or organization: Not on file    Attends meetings of clubs or organizations: Not on file    Relationship status: Not on file  Other Topics Concern  . Not on file  Social History Narrative  . Not on file   Additional Social History:  Pt works as a Production designer, theatre/television/film at [employer].  Has been married x 15 yrs. Has 30 yo, 83 yo and 7 yo sons and a 2 yo daughter with her husband.  Pt states her husband is a drug addict and they just recently separated.  Pt states her children are staying with her parents.  Pt has legal charges of drug possession.   Sleep: Good  Appetite:  Fair  Current Medications: Current Facility-Administered Medications  Medication Dose Route Frequency Provider Last Rate Last Dose  . acetaminophen (TYLENOL) tablet 650 mg  650 mg Oral Q6H PRN Hessie Knows, MD      . albuterol (PROVENTIL HFA;VENTOLIN HFA) 108 (90 Base) MCG/ACT inhaler 1-2 puff  1-2 puff Inhalation Q4H PRN Hessie Knows, MD      . alum & mag hydroxide-simeth (MAALOX/MYLANTA) 200-200-20 MG/5ML suspension 30 mL  30 mL Oral Q4H PRN Clapacs, John T, MD      . ARIPiprazole (ABILIFY) tablet 10 mg  10 mg Oral Daily Clapacs, John T, MD   10 mg at 11/20/18 1145  . azithromycin (ZITHROMAX) tablet 250 mg  250 mg Oral Daily Alford Highland, MD   250 mg at 11/20/18 1145  . cefdinir (OMNICEF) capsule 300 mg  300 mg Oral Q12H Alford Highland, MD   300 mg at 11/20/18 1143  . folic acid (FOLVITE) tablet 1 mg  1 mg Oral Daily Clapacs, John T, MD   1 mg at 11/20/18 1146  . gabapentin (NEURONTIN) capsule 300 mg  300 mg Oral BID O'Neal,  Fransico Setters, MD   300 mg at 11/20/18 1146  . guaiFENesin (ROBITUSSIN) 100 MG/5ML solution 100 mg  5 mL Oral Q4H PRN Hessie Knows, MD      . hydrOXYzine (ATARAX/VISTARIL) tablet 25 mg  25 mg Oral TID PRN Clapacs, Jackquline Denmark, MD   25 mg at 11/19/18 2157  . loperamide (IMODIUM) capsule 2 mg  2 mg Oral PRN Hessie Knows, MD      . LORazepam (ATIVAN) injection 2 mg  2 mg Intramuscular BID PRN Hessie Knows, MD      . magnesium hydroxide (MILK OF MAGNESIA) suspension 30 mL  30 mL Oral Daily PRN Clapacs, John T, MD      . multivitamin with minerals tablet 1 tablet  1 tablet Oral Daily Clapacs, Jackquline Denmark, MD   1 tablet at 11/20/18 1141  . nicotine (NICODERM CQ - dosed in mg/24 hours) patch 14 mg  14 mg Transdermal Daily Clapacs, Jackquline Denmark, MD   14 mg at 11/20/18 1142  . thiamine (VITAMIN B-1) tablet 100 mg  100 mg Oral Daily Hessie Knows, MD   100 mg at 11/20/18 1141  . traZODone (DESYREL) tablet 100 mg  100 mg Oral QHS PRN Clapacs, Jackquline Denmark, MD   100 mg at 11/19/18 2112    Lab Results:  No results found for this or any previous visit (from the past 48 hour(s)).  Blood Alcohol level:  Lab Results  Component Value Date   ETH 12 (H) 11/16/2018   ETH <10 09/23/2017    Metabolic Disorder Labs: Lab Results  Component Value Date   HGBA1C 5.0 11/16/2018   MPG 96.8 11/16/2018   No results found for: PROLACTIN Lab Results  Component Value Date   CHOL 108 11/16/2018   TRIG 54 11/16/2018   HDL 52 11/16/2018   CHOLHDL 2.1 11/16/2018   VLDL 11 11/16/2018   LDLCALC 45 11/16/2018    Physical Findings: AIMS:  , ,  ,  ,    CIWA:  CIWA-Ar Total: 0 COWS:     Musculoskeletal: Strength & Muscle Tone: within  normal limits Gait & Station: normal Patient leans: N/A  Psychiatric Specialty Exam: Physical Exam  Nursing note and vitals reviewed. Constitutional: She is oriented to person, place, and time. She appears well-developed and well-nourished.  HENT:  Head: Normocephalic and atraumatic.  Respiratory: Effort normal. No respiratory distress.  Musculoskeletal: Normal range of motion.  Neurological: She is alert and oriented to person, place, and time.  Psychiatric: Judgment normal. Her mood appears not anxious. She is not agitated and not actively hallucinating. Thought content is not paranoid and not delusional. Cognition and memory are normal. She does not exhibit a depressed mood. She expresses no homicidal and no suicidal ideation.    Review of Systems  Constitutional: Negative.   Respiratory: Negative for shortness of breath.   Cardiovascular: Negative for chest pain.  Gastrointestinal: Negative for abdominal pain, constipation, diarrhea, nausea and vomiting.  Genitourinary: Negative for dysuria.  Musculoskeletal: Negative for myalgias.  Neurological: Negative.  Negative for dizziness, tremors and weakness.  Psychiatric/Behavioral: Negative for depression, hallucinations and suicidal ideas. The patient is not nervous/anxious and does not have insomnia.     Blood pressure 112/72, pulse 96, temperature 98.2 F (36.8 C), temperature source Oral, resp. rate 18, height 5\' 5"  (1.651 m), weight 67.6 kg, SpO2 98 %, unknown if currently breastfeeding.Body mass index is 24.79 kg/m.  General Appearance: Casual  Eye Contact:  Fair  Speech:  Clear and Coherent  Volume:  Normal  Mood:  Euthymic  Affect:  Full Range  Thought Process:  Goal Directed and Linear  Orientation:  Full (Time, Place, and Person)  Thought Content:  Logical  Suicidal Thoughts:  patient denies  Homicidal Thoughts:  patient denies  Memory:  Immediate;   Fair Recent;   Fair Remote;   Fair  Judgement:  Fair  Insight:   Fair  Psychomotor Activity:  Normal  Concentration:  Concentration: Fair  Recall:  [employer] of Knowledge:  Fair  Language:  Fair  Akathisia:  No  Handed:  Right  AIMS (if indicated):     Assets:  Desire for Improvement Housing Physical Health  ADL's:  Intact  Cognition:  WNL  Sleep:  Number of Hours: 7     Treatment Plan Summary: Daily contact with patient to assess and evaluate symptoms and progress in treatment, Medication management and Plan see below    Pt is a 32 yo with polysubstance abuse and hx of bipolar depression and anxiety.  UDS positive for cannabis, amphetamines, benzodiazepines and cocaine.  BAL mildly elevated upon admission.  Admitted for concern of SI, polysubstance abuse, depression and anxiety.  Pt plans to call her public defender today to see if she is suppose to report to jail after discharge.   She's hopeful that she can just go home to be with her family and do SAIOP.     Early in admission, pt had productive cough and abnormal right lung sounds; CXR shows early pneumonia.  Hospitalist prescribed pt oral antibiotics.  Per Dr. Nicky Pugh note from 11/18/18, for "Pneumonia right middle lobe. Continue Omnicef and Zithromax for 4 more days.  Robitussin as needed."  Bipolar depression; PTSD -abilify 10 mg qd -counseling needed -prozac 10 mg daily started 11/20/18 QTc 441  Polysubstance abuse (cocaine, cannabis, amphetamines, benzodiazepines, alcohol) -CIWA -SAIOP  Pneumonia -hospitalist consult; continue antibiotics through 11/22/18  Vaginal discharge/abnormal UA-pt reports unprotected sex; she would like STD testing; also has hx of IV drug use -continue antibiotics for acute cystitis through 11/22/18 -chlamydia and gonorrhea probe via urine specimen negative -HIV test is negative, RPR negative, Hepatitis A and B are negative but Hepatitis C 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 Discussed results with pt.  She would like confirmatory testing; thus  ordered RNA quant for Hep C.  Pt will need follow up with her PCP.     Observation Level/Precautions:  15 minute checks  Laboratory:  see orders  Psychotherapy:  groups  Medications:  See [facility]  Consultations:  hospitalist consult for pneumonia-continue antibiotics through 11/22/18. -they have signed off.   Discharge Concerns:  Currently facing jail time  Tentative Discharge: 12/17 or 12/18  Other:  Referral to inpatient substance abuse rehab      Hessie Knows, MD 11/20/2018, 12:05 PM

## 2018-11-20 NOTE — Plan of Care (Signed)
Patient is calm and cooperative, affect is appropriate and mood is good , responding well to treatment regimen. Patient verbally  contract for safety, thinking is logical and thought content is appropriate. Patient reports reduced anxiety and depression at 3/10. Patient is safe in the unit , denies any Si.HI. AVH , patient needs no help with ADLs, appetite is good and well hydrated with fluids and juices ,.participation in social activities is continuous  with peers and appropriate, education and support provided , 15 minute safety rounding is in progress no distress noted.    Problem: Education: Goal: Knowledge of disease or condition will improve Outcome: Progressing Goal: Understanding of discharge needs will improve Outcome: Progressing   Problem: Health Behavior/Discharge Planning: Goal: Ability to identify changes in lifestyle to reduce recurrence of condition will improve Outcome: Progressing Goal: Identification of resources available to assist in meeting health care needs will improve Outcome: Progressing   Problem: Physical Regulation: Goal: Complications related to the disease process, condition or treatment will be avoided or minimized Outcome: Progressing   Problem: Safety: Goal: Ability to remain free from injury will improve Outcome: Progressing   Problem: Self-Concept: Goal: Will verbalize positive feelings about self Outcome: Progressing   Problem: Coping: Goal: Coping ability will improve Outcome: Progressing   Problem: Medication: Goal: Compliance with prescribed medication regimen will improve Outcome: Progressing   Problem: Self-Concept: Goal: Ability to disclose and discuss suicidal ideas will improve Outcome: Progressing Goal: Will verbalize positive feelings about self Outcome: Progressing

## 2018-11-20 NOTE — [facility] Group Notes (Signed)
[facility] LCSW Group Therapy Note  Date/Time: 11/20/18, 1300  Type of Therapy and Topic:  Group Therapy:  Overcoming Obstacles  Participation Level:  minimal  Description of Group:    In this group patients will be encouraged to explore what they see as obstacles to their own wellness and recovery. They will be guided to discuss their thoughts, feelings, and behaviors related to these obstacles. The group will process together ways to cope with barriers, with attention given to specific choices patients can make. Each patient will be challenged to identify changes they are motivated to make in order to overcome their obstacles. This group will be process-oriented, with patients participating in exploration of their own experiences as well as giving and receiving support and challenge from other group members.  Therapeutic Goals: 1. Patient will identify personal and current obstacles as they relate to admission. 2. Patient will identify barriers that currently interfere with their wellness or overcoming obstacles.  3. Patient will identify feelings, thought process and behaviors related to these barriers. 4. Patient will identify two changes they are willing to make to overcome these obstacles:    Summary of Patient Progress: Pt joined group at the very end and said that her obstacle was her past.  Reports she is in therapy trying to address it.      Therapeutic Modalities:   Cognitive Behavioral Therapy Solution Focused Therapy Motivational Interviewing Relapse Prevention Therapy  Daleen SquibbGreg Antavia Tandy, LCSW

## 2018-11-20 NOTE — [facility] Group Notes (Signed)
[facility] Group Notes:  (Nursing/MHT/Case Management/Adjunct)  Date:  11/20/2018  Time:  2:57 PM  Type of Therapy:  Psychoeducational Skills  Participation Level:  Did Not Attend   Betty SmokeCara Gomez [name] 11/20/2018, 2:57 PM

## 2018-11-20 NOTE — Progress Notes (Signed)
CSW spoke with Betty Gomez who states pt would like to attend [facility] SAIOP; pt scheduled for f/u appt on 12/20 at 715am, Lorella NimrodHarvey will provide transportation. Lorella NimrodHarvey reports pt is being seen at [facility](sees Dr. Mervyn SkeetersA for med mgmt and Onalee Huaavid for therapy). In addition Lorella NimrodHarvey [place] court [place] and Engineer, drillingprobation officer is Field seismologistfficer [employer].

## 2018-11-20 NOTE — Progress Notes (Signed)
Recreation Therapy Notes  Date: 11/20/2018  Time: 9:30 pm   Location: Craft Room   Behavioral response: N/A   Intervention Topic: Problem Solving  Discussion/Intervention: Patient did not attend group.   Clinical Observations/Feedback:  Patient did not attend group.   Naveed Humphres LRT/CTRS        Alivya Wegman 11/20/2018 10:15 AM 

## 2018-11-21 LAB — HCV RNA QUANT
HCV Quantitative Log: 4.832 log10 IU/mL (ref >1.70)
HCV Quantitative: 67900 IU/mL (ref >50)

## 2018-11-21 MED ORDER — TRAZODONE HCL 100 MG PO TABS: 100.0000 mg | ORAL_TABLET | Freq: Every evening | ORAL | 1 refills | Status: DC | PRN

## 2018-11-21 MED ORDER — GUAIFENESIN 100 MG/5ML PO SOLN: 5.0000 mL | ORAL | 0 refills | Status: AC | PRN

## 2018-11-21 MED ORDER — CEFDINIR 300 MG PO CAPS: 300.0000 mg | ORAL_CAPSULE | Freq: Two times a day (BID) | ORAL | 0 refills | Status: AC

## 2018-11-21 MED ORDER — NICOTINE 14 MG/24HR TD PT24: 14.0000 mg | MEDICATED_PATCH | Freq: Every day | TRANSDERMAL | 0 refills | Status: AC

## 2018-11-21 MED ORDER — ARIPIPRAZOLE 10 MG PO TABS: 10.0000 mg | ORAL_TABLET | Freq: Every day | ORAL | 1 refills | Status: DC

## 2018-11-21 MED ORDER — FLUOXETINE HCL 10 MG PO CAPS: 10.0000 mg | ORAL_CAPSULE | Freq: Every day | ORAL | 1 refills | Status: DC

## 2018-11-21 MED ORDER — NICOTINE 7 MG/24HR TD PT24: 7.0000 mg | MEDICATED_PATCH | TRANSDERMAL | 0 refills | Status: AC

## 2018-11-21 MED ORDER — ADULT MULTIVITAMIN W/MINERALS CH: 1.0000 | ORAL_TABLET | Freq: Every day | ORAL | 1 refills | Status: DC

## 2018-11-21 MED ORDER — FOLIC ACID 1 MG PO TABS: 1.0000 mg | ORAL_TABLET | Freq: Every day | ORAL | 1 refills | Status: DC

## 2018-11-21 MED ORDER — THIAMINE HCL 100 MG PO TABS: 100.0000 mg | ORAL_TABLET | Freq: Every day | ORAL | 1 refills | Status: DC

## 2018-11-21 NOTE — Progress Notes (Signed)
D: Pt A & O X 4. Presents with bright affect and expressed her excitement related to discharge. Pt denies SI, HI, AVH and pain at this time. Pt d/c home as ordered. Picked up at the visitor's entrance by her family. A: D/C instructions reviewed with pt including prescriptions and follow up appointment with [facility]; compliance encouraged. All belongings from assigned locker given to pt at time of departure. Scheduled and PRN medications given with verbal education and effects monitored. Safety checks maintained without incident till time of d/c.  R: Pt receptive to care. Compliant with medications when offered. Denies adverse drug reactions when assessed. Verbalized understanding related to d/c instructions. Pt ambulatory with a steady gait. Appears to be in no physical distress at time of departure.
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
## 2018-11-21 NOTE — Progress Notes (Signed)
  [facility] Adult Case Management Discharge Plan :  Will you be returning to the same living situation after discharge:  Yes,  pt lives with grandmother and father At discharge, do you have transportation home?: Yes,  grandmother will pick up Do you have the ability to pay for your medications: Yes,  insurance  Release of information consent forms completed and in the chart;  Patient's signature needed at discharge.  Patient to Follow up at: Follow-up Information    [facility] Go on 11/24/2018.   Why:  Please [facility] at [facility] on Friday, November 24, 2018 at 7:15am; Unk PintoHarvey Bryant will pick you up. Please bring photo ID, medication, insurance card. Thank you. Contact information: [address] [place] [id number] [phone]           Next level of care provider has access to [facility] Link:no  Safety Planning and Suicide Prevention discussed: Yes,  with pt; pt declined family contact  Have you used any form of tobacco in the last 30 days? (Cigarettes, Smokeless Tobacco, Cigars, and/or Pipes): Yes  Has patient been referred to the Quitline?: Pt counseled by physician on smoking cessation  Patient has been referred for addiction treatment: None reported  Suzan SlickARREN T Hennessy Bartel, LCSW 11/21/2018, 10:38 AM

## 2018-11-21 NOTE — Progress Notes (Signed)
Recreation Therapy Notes  INPATIENT RECREATION TR PLAN  Patient Details Name: Betty Gomez [facility] MRN: [id number] DOB: Apr 29, 1986 Today's Date: 11/21/2018  Rec Therapy Plan Is patient appropriate for Therapeutic Recreation?: Yes Treatment times per week: at least 3 Estimated Length of Stay: 5-7 Days TR Treatment/Interventions: Group participation (Comment)  Discharge Criteria Pt will be discharged from therapy if:: Discharged Treatment plan/goals/alternatives discussed and agreed upon by:: Patient/family  Discharge Summary Short term goals set: Patient will engage in groups without prompting or encouragement from LRT x3 group sessions within 5 recreation therapy group sessions Short term goals met: Not met Reason goals not met: Patient did not attend any groups Therapeutic equipment acquired: N/A Reason patient discharged from therapy: Discharge from hospital Pt/family agrees with progress & goals achieved: Yes Date patient discharged from therapy: 11/21/18   Ady Heimann 11/21/2018, 3:07 PM

## 2018-11-21 NOTE — Progress Notes (Signed)
Recreation Therapy Notes  Date: 11/21/2018  Time: 9:30 pm   Location: Craft Room   Behavioral response: N/A   Intervention Topic: Communication  Discussion/Intervention: Patient did not attend group.   Clinical Observations/Feedback:  Patient did not attend group.   Sahra Converse LRT/CTRS        Betty Gomez 11/21/2018 11:34 AM 
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
## 2019-05-31 ENCOUNTER — Other Ambulatory Visit: Payer: Self-pay

## 2019-05-31 ENCOUNTER — Emergency Department
Admission: EM | Admit: 2019-05-31 | Discharge: 2019-05-31 | Disposition: A | Discharge status: Home / Self Care | Payer: Medicaid Other | Attending: Emergency Medicine | Admitting: Emergency Medicine

## 2019-05-31 ENCOUNTER — Encounter: Payer: Self-pay | Admitting: Intensive Care

## 2019-05-31 DIAGNOSIS — Z5321 Procedure and treatment not carried out due to patient leaving prior to being seen by health care provider: Secondary | ICD-10-CM | POA: Insufficient documentation

## 2019-05-31 DIAGNOSIS — R21 Rash and other nonspecific skin eruption: Secondary | ICD-10-CM | POA: Diagnosis present

## 2019-05-31 NOTE — ED Notes (Signed)
This RN and Museum/gallery conservator, RN witnessed patient walk out of ER, get into vehicle and witnessed vehicle drive out of parking lot.

## 2019-05-31 NOTE — ED Triage Notes (Signed)
Patient has rash present on left side of face she wants checked out. Multiple areas that are scabbed over. Denies itching. Reports she used meth and heroin yesterday.

## 2019-06-10 ENCOUNTER — Encounter: Payer: Self-pay | Admitting: Emergency Medicine

## 2019-06-10 ENCOUNTER — Other Ambulatory Visit: Payer: Self-pay

## 2019-06-10 ENCOUNTER — Inpatient Hospital Stay
Admission: EM | Admit: 2019-06-10 | Discharge: 2019-06-13 | DRG: 917 | Disposition: A | Discharge status: Home / Self Care | Payer: Medicaid Other | Attending: Internal Medicine | Admitting: Internal Medicine

## 2019-06-10 ENCOUNTER — Emergency Department: Payer: Medicaid Other

## 2019-06-10 DIAGNOSIS — B1920 Unspecified viral hepatitis C without hepatic coma: Secondary | ICD-10-CM | POA: Diagnosis present

## 2019-06-10 DIAGNOSIS — F1920 Other psychoactive substance dependence, uncomplicated: Secondary | ICD-10-CM | POA: Diagnosis present

## 2019-06-10 DIAGNOSIS — Z79899 Other long term (current) drug therapy: Secondary | ICD-10-CM

## 2019-06-10 DIAGNOSIS — T50902A Poisoning by unspecified drugs, medicaments and biological substances, intentional self-harm, initial encounter: Secondary | ICD-10-CM | POA: Diagnosis not present

## 2019-06-10 DIAGNOSIS — Z560 Unemployment, unspecified: Secondary | ICD-10-CM | POA: Diagnosis not present

## 2019-06-10 DIAGNOSIS — J45901 Unspecified asthma with (acute) exacerbation: Secondary | ICD-10-CM | POA: Diagnosis not present

## 2019-06-10 DIAGNOSIS — T50914A Poisoning by multiple unspecified drugs, medicaments and biological substances, undetermined, initial encounter: Secondary | ICD-10-CM | POA: Diagnosis not present

## 2019-06-10 DIAGNOSIS — Y92009 Unspecified place in unspecified non-institutional (private) residence as the place of occurrence of the external cause: Secondary | ICD-10-CM | POA: Diagnosis not present

## 2019-06-10 DIAGNOSIS — F1910 Other psychoactive substance abuse, uncomplicated: Secondary | ICD-10-CM | POA: Diagnosis present

## 2019-06-10 DIAGNOSIS — Z9119 Patient's noncompliance with other medical treatment and regimen: Secondary | ICD-10-CM

## 2019-06-10 DIAGNOSIS — J9601 Acute respiratory failure with hypoxia: Secondary | ICD-10-CM | POA: Diagnosis present

## 2019-06-10 DIAGNOSIS — F1290 Cannabis use, unspecified, uncomplicated: Secondary | ICD-10-CM | POA: Diagnosis present

## 2019-06-10 DIAGNOSIS — F1590 Other stimulant use, unspecified, uncomplicated: Secondary | ICD-10-CM | POA: Diagnosis present

## 2019-06-10 DIAGNOSIS — R45851 Suicidal ideations: Secondary | ICD-10-CM | POA: Diagnosis not present

## 2019-06-10 DIAGNOSIS — Z818 Family history of other mental and behavioral disorders: Secondary | ICD-10-CM | POA: Diagnosis not present

## 2019-06-10 DIAGNOSIS — F1190 Opioid use, unspecified, uncomplicated: Secondary | ICD-10-CM | POA: Diagnosis present

## 2019-06-10 DIAGNOSIS — F319 Bipolar disorder, unspecified: Secondary | ICD-10-CM | POA: Diagnosis present

## 2019-06-10 DIAGNOSIS — R4182 Altered mental status, unspecified: Secondary | ICD-10-CM | POA: Diagnosis not present

## 2019-06-10 DIAGNOSIS — T50992A Poisoning by other drugs, medicaments and biological substances, intentional self-harm, initial encounter: Secondary | ICD-10-CM | POA: Diagnosis not present

## 2019-06-10 DIAGNOSIS — F17210 Nicotine dependence, cigarettes, uncomplicated: Secondary | ICD-10-CM | POA: Diagnosis present

## 2019-06-10 DIAGNOSIS — Z1159 Encounter for screening for other viral diseases: Secondary | ICD-10-CM

## 2019-06-10 DIAGNOSIS — F418 Other specified anxiety disorders: Secondary | ICD-10-CM | POA: Diagnosis present

## 2019-06-10 DIAGNOSIS — J45909 Unspecified asthma, uncomplicated: Secondary | ICD-10-CM | POA: Diagnosis not present

## 2019-06-10 DIAGNOSIS — Z20828 Contact with and (suspected) exposure to other viral communicable diseases: Secondary | ICD-10-CM | POA: Diagnosis not present

## 2019-06-10 DIAGNOSIS — F329 Major depressive disorder, single episode, unspecified: Secondary | ICD-10-CM | POA: Diagnosis present

## 2019-06-10 DIAGNOSIS — T50904A Poisoning by unspecified drugs, medicaments and biological substances, undetermined, initial encounter: Secondary | ICD-10-CM

## 2019-06-10 LAB — URINE DRUG SCREEN, QUALITATIVE (ARMC ONLY)
Amphetamines, Ur Screen: POSITIVE — AB
Barbiturates, Ur Screen: NOT DETECTED
Benzodiazepine, Ur Scrn: POSITIVE — AB
Cannabinoid 50 Ng, Ur ~~LOC~~: POSITIVE — AB
Cocaine Metabolite,Ur ~~LOC~~: NOT DETECTED
MDMA (Ecstasy)Ur Screen: NOT DETECTED
Methadone Scn, Ur: POSITIVE — AB
Opiate, Ur Screen: NOT DETECTED
Phencyclidine (PCP) Ur S: NOT DETECTED
Tricyclic, Ur Screen: POSITIVE — AB

## 2019-06-10 LAB — BASIC METABOLIC PANEL
Anion gap: 6 (ref 5–15)
BUN: 10 mg/dL (ref 6–20)
CO2: 27 mmol/L (ref 22–32)
Calcium: 8.8 mg/dL — ABNORMAL LOW (ref 8.9–10.3)
Chloride: 105 mmol/L (ref 98–111)
Creatinine, Ser: 0.98 mg/dL (ref 0.44–1.00)
GFR calc Af Amer: >60 mL/min (ref >60)
GFR calc non Af Amer: >60 mL/min (ref >60)
Glucose, Bld: 111 mg/dL — ABNORMAL HIGH (ref 70–99)
Potassium: 4 mmol/L (ref 3.5–5.1)
Sodium: 138 mmol/L (ref 135–145)

## 2019-06-10 LAB — CBC WITH DIFFERENTIAL/PLATELET
Abs Immature Granulocytes: 0.05 10*3/uL (ref 0.00–0.07)
Basophils Absolute: 0 10*3/uL (ref 0.0–0.1)
Basophils Relative: 0 %
Eosinophils Absolute: 0.3 10*3/uL (ref 0.0–0.5)
Eosinophils Relative: 2 %
HCT: 38.5 % (ref 36.0–46.0)
Hemoglobin: 12.3 g/dL (ref 12.0–15.0)
Immature Granulocytes: 0 %
Lymphocytes Relative: 14 %
Lymphs Abs: 1.7 10*3/uL (ref 0.7–4.0)
MCH: 30 pg (ref 26.0–34.0)
MCHC: 31.9 g/dL (ref 30.0–36.0)
MCV: 93.9 fL (ref 80.0–100.0)
Monocytes Absolute: 0.7 10*3/uL (ref 0.1–1.0)
Monocytes Relative: 6 %
Neutro Abs: 9.2 10*3/uL — ABNORMAL HIGH (ref 1.7–7.7)
Neutrophils Relative %: 78 %
Platelets: 220 10*3/uL (ref 150–400)
RBC: 4.1 MIL/uL (ref 3.87–5.11)
RDW: 12.5 % (ref 11.5–15.5)
WBC: 11.9 10*3/uL — ABNORMAL HIGH (ref 4.0–10.5)
nRBC: 0 % (ref 0.0–0.2)

## 2019-06-10 LAB — PREGNANCY, URINE: Preg Test, Ur: NEGATIVE

## 2019-06-10 LAB — URINALYSIS, COMPLETE (UACMP) WITH MICROSCOPIC
Bacteria, UA: NONE SEEN
Bilirubin Urine: NEGATIVE
Glucose, UA: NEGATIVE mg/dL
Ketones, ur: NEGATIVE mg/dL
Leukocytes,Ua: NEGATIVE
Nitrite: NEGATIVE
Protein, ur: NEGATIVE mg/dL
Specific Gravity, Urine: 1.011 (ref 1.005–1.030)
pH: 5 (ref 5.0–8.0)

## 2019-06-10 LAB — ETHANOL: Alcohol, Ethyl (B): <10 mg/dL (ref <10)

## 2019-06-10 LAB — SARS CORONAVIRUS 2 BY RT PCR (HOSPITAL ORDER, PERFORMED IN ~~LOC~~ HOSPITAL LAB): SARS Coronavirus 2: NEGATIVE

## 2019-06-10 MED ORDER — ENOXAPARIN SODIUM 40 MG/0.4ML ~~LOC~~ SOLN
40.0000 mg | SUBCUTANEOUS | Status: DC
  Administered 2019-06-10 – 2019-06-11 (×2): 40 mg via SUBCUTANEOUS
  Filled 2019-06-10 (×2): qty 0.4

## 2019-06-10 MED ORDER — NALOXONE HCL 2 MG/2ML IJ SOSY
1.0000 mg | PREFILLED_SYRINGE | Freq: Once | INTRAMUSCULAR | Status: AC
  Administered 2019-06-10: 1 mg via INTRAVENOUS
  Filled 2019-06-10: qty 2

## 2019-06-10 MED ORDER — NALOXONE HCL 0.4 MG/ML IJ SOLN: 0.4000 mg | INTRAMUSCULAR | Status: DC | PRN

## 2019-06-10 MED ORDER — ONDANSETRON HCL 4 MG/2ML IJ SOLN
4.0000 mg | Freq: Once | INTRAMUSCULAR | Status: AC
  Administered 2019-06-10: 4 mg via INTRAVENOUS
  Filled 2019-06-10: qty 2

## 2019-06-10 MED ORDER — SODIUM CHLORIDE 0.9 % IV SOLN
INTRAVENOUS | Status: DC
  Administered 2019-06-10 – 2019-06-12 (×4): via INTRAVENOUS

## 2019-06-10 NOTE — ED Notes (Signed)
ED TO INPATIENT HANDOFF REPORT  ED Nurse Name and Phone #: Victorino DikeJennifer [phone]  S Name/Age/Gender Betty Gomez 33 y.o. female Room/Bed: ED15A/ED15A  Code Status   Code Status: Full Code  Home/SNF/Other Home Patient oriented to: self Is this baseline? No   Triage Complete: Triage complete  Chief Complaint Unresponsive   Triage Note Pt to ER via EMS from home, pt was found by children unresponsive.  Pt has hx of IV drug abuse and was found with syringes nearby.  Per FD pt sats were 48% initially and responded to pt being bagged.  Pt was given 2mg  IV narcan and is now maintaining her own airway.  Pt had vomited and possibly aspirated at that time.   Allergies No Known Allergies  Level of Care/Admitting Diagnosis ED Disposition    ED Disposition Condition Comment   Admit  [facility] [[id number]]  Level of Care: Med-Surg [16]  Covid Evaluation: Confirmed COVID Negative  Diagnosis: Drug overdose, intentional, initial encounter [facility](HCC) [[id number]]) [[id number]]  Admitting Physician: Cristie HemUMA, ELIZABETH ACHIENG [[id number]]  Attending Physician: Webb SilversmithUMA, ELIZABETH ACHIENG [[id number]][[id number]]  Estimated length of stay: past midnight tomorrow  Certification:: I certify this patient will need inpatient services for at least 2 midnights  PT Class (Do Not Modify): Inpatient [101]  PT Acc Code (Do Not Modify): Private [1]       B Medical/Surgery History Past Medical History:  Diagnosis Date  . Anxiety   . Asthma   . Depression    ok, came off meds, has someone she sees for this.  . Endometriosis   . Infection    UTI   Past Surgical History:  Procedure Laterality Date  . DIAGNOSTIC LAPAROSCOPY     endometriosis  . TUBAL LIGATION N/A 09/17/2015   Procedure: POST PARTUM TUBAL LIGATION;  Surgeon: Vena AustriaAndreas Staebler, MD;  Location: [facility] ORS;  Service: Gynecology;  Laterality: N/A;  . TUBAL LIGATION Bilateral 09/21/2016   Procedure: POST PARTUM TUBAL LIGATION;  Surgeon: Nadara Mustardobert  P Harris, MD;  Location: [facility] ORS;  Service: Gynecology;  Laterality: Bilateral;     A IV Location/Drains/Wounds Patient Lines/Drains/Airways Status   Active Line/Drains/Airways    Name:   Placement date:   Placement time:   Site:   Days:   Peripheral IV 06/10/19 Left Antecubital   06/10/19    0810    Antecubital   less than 1   External Urinary Catheter   06/10/19    0841    -   less than 1   Incision (Closed) 09/21/16 Abdomen   09/21/16    1143     992          Intake/Output Last 24 hours No intake or output data in the 24 hours ending 06/10/19 1128  Labs/Imaging Results for orders placed or performed during the hospital encounter of 06/10/19 (from the past 48 hour(s))  Basic metabolic panel     Status: Abnormal   Collection Time: 06/10/19  8:11 AM  Result Value Ref Range   Sodium 138 135 - 145 mmol/L   Potassium 4.0 3.5 - 5.1 mmol/L   Chloride 105 98 - 111 mmol/L   CO2 27 22 - 32 mmol/L   Glucose, Bld 111 (H) 70 - 99 mg/dL   BUN 10 6 - 20 mg/dL   Creatinine, Ser 8.290.98 0.44 - 1.00 mg/dL   Calcium 8.8 (L) 8.9 - 10.3 mg/dL   GFR calc non Af Amer >60 >60 mL/min   GFR calc Af  Amer >60 >60 mL/min   Anion gap 6 5 - 15    Comment: Performed at [facility], [address] [place], [place] [address]  CBC with Differential     Status: Abnormal   Collection Time: 06/10/19  8:11 AM  Result Value Ref Range   WBC 11.9 (H) 4.0 - 10.5 K/uL   RBC 4.10 3.87 - 5.11 MIL/uL   Hemoglobin 12.3 12.0 - 15.0 g/dL   HCT 19.138.5 47.836.0 - 29.546.0 %   MCV 93.9 80.0 - 100.0 fL   MCH 30.0 26.0 - 34.0 pg   MCHC 31.9 30.0 - 36.0 g/dL   RDW 62.112.5 30.811.5 - 65.715.5 %   Platelets 220 150 - 400 K/uL   nRBC 0.0 0.0 - 0.2 %   Neutrophils Relative % 78 %   Neutro Abs 9.2 (H) 1.7 - 7.7 K/uL   Lymphocytes Relative 14 %   Lymphs Abs 1.7 0.7 - 4.0 K/uL   Monocytes Relative 6 %   Monocytes Absolute 0.7 0.1 - 1.0 K/uL   Eosinophils Relative 2 %   Eosinophils Absolute 0.3 0.0 - 0.5 K/uL   Basophils Relative 0  %   Basophils Absolute 0.0 0.0 - 0.1 K/uL   Immature Granulocytes 0 %   Abs Immature Granulocytes 0.05 0.00 - 0.07 K/uL    Comment: Performed at [facility], [address] [place], [place] [address]  Urinalysis, Complete w Microscopic     Status: Abnormal   Collection Time: 06/10/19  8:37 AM  Result Value Ref Range   Color, Urine STRAW (A) YELLOW   APPearance CLEAR (A) CLEAR   Specific Gravity, Urine 1.011 1.005 - 1.030   pH 5.0 5.0 - 8.0   Glucose, UA NEGATIVE NEGATIVE mg/dL   Hgb urine dipstick MODERATE (A) NEGATIVE   Bilirubin Urine NEGATIVE NEGATIVE   Ketones, ur NEGATIVE NEGATIVE mg/dL   Protein, ur NEGATIVE NEGATIVE mg/dL   Nitrite NEGATIVE NEGATIVE   Leukocytes,Ua NEGATIVE NEGATIVE   RBC / HPF 0-5 0 - 5 RBC/hpf   WBC, UA 0-5 0 - 5 WBC/hpf   Bacteria, UA NONE SEEN NONE SEEN   Squamous Epithelial / LPF 0-5 0 - 5   Mucus PRESENT     Comment: Performed at [facility], [address] [place], [place] [address]  Urine Drug Screen, Qualitative     Status: Abnormal   Collection Time: 06/10/19  8:37 AM  Result Value Ref Range   Tricyclic, Ur Screen POSITIVE (A) NONE DETECTED   Amphetamines, Ur Screen POSITIVE (A) NONE DETECTED   MDMA (Ecstasy)Ur Screen NONE DETECTED NONE DETECTED   Cocaine Metabolite,Ur [place] NONE DETECTED NONE DETECTED   Opiate, Ur Screen NONE DETECTED NONE DETECTED   Phencyclidine (PCP) Ur S NONE DETECTED NONE DETECTED   Cannabinoid 50 Ng, Ur [place] POSITIVE (A) NONE DETECTED   Barbiturates, Ur Screen NONE DETECTED NONE DETECTED   Benzodiazepine, Ur Scrn POSITIVE (A) NONE DETECTED   Methadone Scn, Ur POSITIVE (A) NONE DETECTED    Comment: (NOTE) Tricyclics + metabolites, urine    Cutoff 1000 ng/mL Amphetamines + metabolites, urine  Cutoff 1000 ng/mL MDMA (Ecstasy), urine              Cutoff 500 ng/mL Cocaine Metabolite, urine          Cutoff 300 ng/mL Opiate + metabolites, urine        Cutoff 300 ng/mL Phencyclidine (PCP), urine          Cutoff 25  ng/mL Cannabinoid, urine                 Cutoff 50 ng/mL Barbiturates + metabolites, urine  Cutoff 200 ng/mL Benzodiazepine, urine              Cutoff 200 ng/mL Methadone, urine                   Cutoff 300 ng/mL The urine drug screen provides only a preliminary, unconfirmed analytical test result and should not be used for non-medical purposes. Clinical consideration and professional judgment should be applied to any positive drug screen result due to possible interfering substances. A more specific alternate chemical method must be used in order to obtain a confirmed analytical result. Gas chromatography / mass spectrometry (GC/MS) is the preferred confirmat ory method. Performed at [facility], [address] [place], [place] [address]   Pregnancy, urine     Status: None   Collection Time: 06/10/19  8:37 AM  Result Value Ref Range   Preg Test, Ur NEGATIVE NEGATIVE    Comment: Performed at [facility], [address] [place], [place] [address]  Ethanol     Status: None   Collection Time: 06/10/19  9:19 AM  Result Value Ref Range   Alcohol, Ethyl (B) <10 <10 mg/dL    Comment: (NOTE) Lowest detectable limit for serum alcohol is 10 mg/dL. For medical purposes only. Performed at [facility], [address] [place], [place] [address]    Dg Chest Portable 1 View  Result Date: 06/10/2019 CLINICAL DATA:  Unresponsive. EXAM: PORTABLE CHEST 1 VIEW COMPARISON:  Radiographs of November 17, 2018. FINDINGS: The heart size and mediastinal contours are within normal limits. Both lungs are clear. The visualized skeletal structures are unremarkable. IMPRESSION: No active disease. Electronically Signed   By: Marijo Conception M.D.   On: 06/10/2019 08:37    Pending Labs Unresulted Labs (From admission, onward)    Start     Ordered   06/17/19 0500  Creatinine, serum  (enoxaparin (LOVENOX)    CrCl >/= 30 ml/min)  Weekly,   STAT    Comments: while on  enoxaparin therapy    06/10/19 1115   06/10/19 1111  CBC  (enoxaparin (LOVENOX)    CrCl >/= 30 ml/min)  Once,   STAT    Comments: Baseline for enoxaparin therapy IF NOT ALREADY DRAWN.  Notify MD if PLT < 100 K.    06/10/19 1115   06/10/19 1111  Creatinine, serum  (enoxaparin (LOVENOX)    CrCl >/= 30 ml/min)  Once,   STAT    Comments: Baseline for enoxaparin therapy IF NOT ALREADY DRAWN.    06/10/19 1115   06/10/19 1043  SARS Coronavirus 2 (CEPHEID - Performed in [place] hospital lab), Hosp Order  (Asymptomatic Patients Labs)  ONCE - STAT,   STAT    Question:  Rule Out  Answer:  Yes   06/10/19 1042          Vitals/Pain Today's Vitals   06/10/19 0930 06/10/19 1000 06/10/19 1030 06/10/19 1100  BP: 105/61 98/60 102/63 (!) 120/92  Pulse: 93 89 82 97  Resp: 15 16 18 19   Temp:      TempSrc:      SpO2: 100% 100% 97% 97%  Weight:      Height:      PainSc:        Isolation Precautions No active isolations  Medications Medications  enoxaparin (LOVENOX) injection  40 mg (has no administration in time range)  0.9 %  sodium chloride infusion (has no administration in time range)  ondansetron (ZOFRAN) injection 4 mg (4 mg Intravenous Given 06/10/19 1001)  naloxone (NARCAN) injection 1 mg (1 mg Intravenous Given 06/10/19 1001)    Mobility walks with person assist High fall risk   Focused Assessments Cardiac Assessment Handoff:    No results found for: CKTOTAL, CKMB, CKMBINDEX, TROPONINI Lab Results  Component Value Date   DDIMER 0.48 05/02/2018   Does the Patient currently have chest pain? No     R Recommendations: See Admitting Provider Note  Report given to:   Additional Notes:  Beginning to wake intermittently, but is very confused and possibly hallucinating when she does.

## 2019-06-10 NOTE — ED Notes (Signed)
Pt noted to desat into low 80s, pt placed on 2L [place] O2 sats into high 80s.  Oxygen increased to 4l [place], pt now with sats in mid 90s.  Dr. Joni Fears aware.

## 2019-06-10 NOTE — ED Provider Notes (Signed)
[facility] Emergency Department Provider Note  ____________________________________________  Time seen: Approximately 8:27 AM  I have reviewed the triage vital signs and the nursing notes.   HISTORY  Chief Complaint Drug Overdose    Level 5 Caveat: Portions of the History and Physical including HPI and review of systems are unable to be completely obtained due to patient being somnolent  HPI Betty Gomez is a 33 y.o. female with a history of anxiety depression endometriosis bipolar disorder polysubstance abuse, IV drug use who was staying with her children this weekend, found unresponsive.  First responders came to scene found the patient with agonal respirations, pinpoint pupils, initial oxygen saturation of 48%.  They gave 2 mg of intramuscular Narcan with good response and arousal.  They report the patient did vomit on scene.  EMS noted oxygenation of about 96% on room air and made no further interventions other than placing an IV.  Patient is not able to provide any meaningful history at this time.   EMS report that syringes were found on scene. Prior ED visit 2 weeks ago patient reported using methamphetamine and heroin on June 24.   Past Medical History:  Diagnosis Date  . Anxiety   . Asthma   . Depression    ok, came off meds, has someone she sees for this.  . Endometriosis   . Infection    UTI     Patient Active Problem List   Diagnosis Date Noted  . Severe recurrent major depression without psychotic features ([place]) 11/17/2018  . Bipolar disorder, unspecified ([place]) 11/17/2018  . Substance induced mood disorder ([place]) 11/17/2018  . Cannabis abuse 11/17/2018  . Methamphetamine abuse ([place]) 11/17/2018  . Opioid abuse ([place]) 11/17/2018  . Tobacco abuse 11/17/2018  . Polysubstance abuse ([place]) 11/17/2018  . Suicidal ideation 11/16/2018  . Depression 11/16/2018  . Alcohol abuse 11/16/2018  . Adjustment disorder with mixed disturbance of  emotions and conduct 09/21/2017  . Benzodiazepine abuse ([place]) 09/21/2017  . Normal labor 09/20/2016  . Labor and delivery, indication for care 09/08/2016  . Decreased fetal movement 07/24/2016  . Pregnancy 09/15/2015  . Insufficient prenatal care 09/15/2015  . Indication for care in labor or delivery 09/10/2015  . Braxton Hicks contractions 09/08/2015  . Candida albicans infection 08/16/2015  . Abnormal antenatal AFP screen 05/24/2015  . Irregular contractions 05/24/2015     Past Surgical History:  Procedure Laterality Date  . DIAGNOSTIC LAPAROSCOPY     endometriosis  . TUBAL LIGATION N/A 09/17/2015   Procedure: POST PARTUM TUBAL LIGATION;  Surgeon: Malachy Mood, MD;  Location: [facility] ORS;  Service: Gynecology;  Laterality: N/A;  . TUBAL LIGATION Bilateral 09/21/2016   Procedure: POST PARTUM TUBAL LIGATION;  Surgeon: Gae Dry, MD;  Location: [facility] ORS;  Service: Gynecology;  Laterality: Bilateral;     Prior to Admission medications   Medication Sig Start Date End Date Taking? Authorizing Provider  albuterol (PROVENTIL HFA;VENTOLIN HFA) 108 (90 Base) MCG/ACT inhaler Inhale 1-2 puffs into the lungs every 6 (six) hours as needed for wheezing or shortness of breath. 04/19/18   Tasia Catchings, Amy V, PA-C  ARIPiprazole (ABILIFY) 10 MG tablet Take 1 tablet (10 mg total) by mouth daily. 11/21/18   Tennis Ship, MD  FLUoxetine (PROZAC) 10 MG capsule Take 1 capsule (10 mg total) by mouth daily. 11/22/18   Tennis Ship, MD  folic acid (FOLVITE) 1 MG tablet Take 1 tablet (1 mg total) by mouth daily. 11/22/18   Tennis Ship, MD  ketorolac (ACULAR) 0.5 % ophthalmic solution Place 1 drop into the right eye 4 (four) times daily. 11/08/18   Cuthriell, Delorise RoyalsJonathan D, PA-C  Multiple Vitamin (MULTIVITAMIN WITH MINERALS) TABS tablet Take 1 tablet by mouth daily. 11/22/18   Hessie Knows'Neal, Sarita, MD  thiamine 100 MG tablet Take 1 tablet (100 mg total) by mouth daily. 11/22/18   Hessie Knows'Neal, Sarita, MD  traZODone  (DESYREL) 100 MG tablet Take 1 tablet (100 mg total) by mouth at bedtime as needed for sleep. 11/21/18   Hessie Knows'Neal, Sarita, MD  trimethoprim-polymyxin b (POLYTRIM) ophthalmic solution Place 2 drops into both eyes every 6 (six) hours. 11/08/18   Cuthriell, Delorise RoyalsJonathan D, PA-C     Allergies Patient has no known allergies.   Family History  Problem Relation Age of Onset  . Hypertension Mother   . Diabetes Mother   . Heart disease Mother 7034       failure  . Heart failure Mother   . Depression Father   . Heart disease Maternal Grandfather   . Diabetes Paternal Grandmother   . Hypertension Paternal Grandmother   . Cancer Paternal Grandfather        lung  . Hearing loss Neg Hx     Social History Social History   Tobacco Use  . Smoking status: Heavy Tobacco Smoker    Packs/day: 0.50    Years: 10.00    Pack years: 5.00    Types: Cigarettes  . Smokeless tobacco: Never Used  Substance Use Topics  . Alcohol use: No  . Drug use: Yes    Types: Marijuana, Methamphetamines, Heroin    Comment: heroin    Review of Systems Level 5 Caveat: Portions of the History and Physical including HPI and review of systems are unable to be completely obtained due to patient being a poor historian   Constitutional:   No known fever.  ENT:   No rhinorrhea. Cardiovascular:   No chest pain or syncope. Respiratory: Hypoxia. Gastrointestinal:   Negative for abdominal pain, vomiting and diarrhea.  Musculoskeletal:   Negative for focal pain or swelling ____________________________________________   PHYSICAL EXAM:  VITAL SIGNS: ED Triage Vitals  Enc Vitals Group     BP 06/10/19 0815 129/83     Pulse Rate 06/10/19 0815 89     Resp 06/10/19 0815 (!) 23     Temp 06/10/19 0815 97.9 F (36.6 C)     Temp Source 06/10/19 0815 Oral     SpO2 06/10/19 0815 93 %     Weight 06/10/19 0816 164 lb 14.5 oz (74.8 kg)     Height 06/10/19 0816 5\' 6"  (1.676 m)     Head Circumference --      Peak Flow --      Pain  Score 06/10/19 0816 0     Pain Loc --      Pain Edu? --      Excl. in GC? --     Vital signs reviewed, nursing assessments reviewed.   Constitutional:   Stuporous, arousable. Eyes:   Conjunctivae are normal. EOMI untestable. PERRL, sluggish. ENT      Head:   Normocephalic and atraumatic.      Nose:   No congestion/rhinnorhea.       Mouth/Throat:   MMM, no pharyngeal erythema. No peritonsillar mass.       Neck:   No meningismus. Full ROM. Hematological/Lymphatic/Immunilogical:   No cervical lymphadenopathy. Cardiovascular:   RRR. Symmetric bilateral radial and DP pulses.  No murmurs. Cap refill less  than 2 seconds. Respiratory:   Normal respiratory effort without tachypnea/retractions.  Diffusely rhonchorous breath sounds Gastrointestinal:   Soft and nontender.  Mildly distended. There is no CVA tenderness.  No rebound, rigidity, or guarding. Musculoskeletal:   Normal range of motion in all extremities. No joint effusions.  No lower extremity tenderness.  No edema. Neurologic:   Slurred speech.  Motor grossly intact. No acute focal neurologic deficits are appreciated.  Skin:    Skin is warm, dry and intact. No rash noted.  No petechiae, purpura, or bullae.  No identifiable skin puncture sites or ulcerations.  ____________________________________________    LABS (pertinent positives/negatives) (all labs ordered are listed, but only abnormal results are displayed) Labs Reviewed  BASIC METABOLIC PANEL - Abnormal; Notable for the following components:      Result Value   Glucose, Bld 111 (*)    Calcium 8.8 (*)    All other components within normal limits  CBC WITH DIFFERENTIAL/PLATELET - Abnormal; Notable for the following components:   WBC 11.9 (*)    Neutro Abs 9.2 (*)    All other components within normal limits  URINALYSIS, COMPLETE (UACMP) WITH MICROSCOPIC - Abnormal; Notable for the following components:   Color, Urine STRAW (*)    APPearance CLEAR (*)    Hgb urine  dipstick MODERATE (*)    All other components within normal limits  URINE DRUG SCREEN, QUALITATIVE ([facility] ONLY) - Abnormal; Notable for the following components:   Tricyclic, Ur Screen POSITIVE (*)    Amphetamines, Ur Screen POSITIVE (*)    Cannabinoid 50 Ng, Ur [place] POSITIVE (*)    Benzodiazepine, Ur Scrn POSITIVE (*)    Methadone Scn, Ur POSITIVE (*)    All other components within normal limits  SARS CORONAVIRUS 2 (HOSPITAL ORDER, PERFORMED IN [facility])  PREGNANCY, URINE  ETHANOL   ____________________________________________   EKG  Interpreted by me  Date: 06/10/2019  Rate: 92  Rhythm: normal sinus rhythm  QRS Axis: normal  Intervals: normal  ST/T Wave abnormalities: normal  Conduction Disutrbances: none  Narrative Interpretation: unremarkable      ____________________________________________    RADIOLOGY  Dg Chest Portable 1 View  Result Date: 06/10/2019 CLINICAL DATA:  Unresponsive. EXAM: PORTABLE CHEST 1 VIEW COMPARISON:  Radiographs of November 17, 2018. FINDINGS: The heart size and mediastinal contours are within normal limits. Both lungs are clear. The visualized skeletal structures are unremarkable. IMPRESSION: No active disease. Electronically Signed   By: Lupita RaiderJames  Green Jr M.D.   On: 06/10/2019 08:37    ____________________________________________   PROCEDURES .Critical Care Performed by: Sharman CheekStafford, Etienne Mowers, MD Authorized by: Sharman CheekStafford, Read Bonelli, MD   Critical care provider statement:    Critical care time (minutes):  35   Critical care time was exclusive of:  Separately billable procedures and treating other patients   Critical care was necessary to treat or prevent imminent or life-threatening deterioration of the following conditions:  Respiratory failure, CNS failure or compromise and toxidrome   Critical care was time spent personally by me on the following activities:  Development of treatment plan with patient or surrogate, discussions  with consultants, evaluation of patient's response to treatment, examination of patient, obtaining history from patient or surrogate, ordering and performing treatments and interventions, ordering and review of laboratory studies, ordering and review of radiographic studies, pulse oximetry, re-evaluation of patient's condition and review of old charts    ____________________________________________  DIFFERENTIAL DIAGNOSIS   Opiate overdose, aspiration, pulmonary edema  CLINICAL IMPRESSION /  ASSESSMENT AND PLAN / ED COURSE  Pertinent labs & imaging results that were available during my care of the patient were reviewed by me and considered in my medical decision making (see chart for details).   Sanda Kleinshley Brooke Champoux was evaluated in Emergency Department on 06/10/2019 for the symptoms described in the history of present illness. She was evaluated in the context of the global COVID-19 pandemic, which necessitated consideration that the patient might be at risk for infection with the SARS-CoV-2 virus that causes COVID-19. Institutional protocols and algorithms that pertain to the evaluation of patients at risk for COVID-19 are in a state of rapid change based on information released by regulatory bodies including the CDC and federal and state organizations. These policies and algorithms were followed during the patient's care in the ED.   Patient presents with rhonchorous breath sounds, borderline oxygen level of 93%, mild tachypnea after being aroused from a apparent opiate overdose with Narcan.  Check a chest x-ray, monitor in ED, check labs.  Clinical Course as of Jun 09 1048  Sun Jun 10, 2019  1023 Labs unremarkable except for UDS which is positive for tricyclics, amphetamines, cannabinoid, benzodiazepine, methadone.  Concerning for likely prolonged intoxication effect.  Patient had recurrent hypoxia in the ED requiring 4 L nasal cannula oxygen to maintain normoxia.  Will give another dose of  Narcan but if she has further relapsing hypoxia she will need to be admitted.   [PS]    Clinical Course User Index [PS] Sharman CheekStafford, Malanie Koloski, MD     ----------------------------------------- 10:49 AM on 06/10/2019 -----------------------------------------  Still stuporous and hypoxic.  Case discussed with hospitalist  ____________________________________________   FINAL CLINICAL IMPRESSION(S) / ED DIAGNOSES    Final diagnoses:  Polysubstance abuse (HCC)  Drug overdose, undetermined intent, initial encounter  Acute respiratory failure with hypoxia [facility](HCC)     ED Discharge Orders    None      Portions of this note were generated with dragon dictation software. Dictation errors may occur despite best attempts at proofreading.   Sharman CheekStafford, Jovani Flury, MD 06/10/19 1049

## 2019-06-10 NOTE — ED Notes (Signed)
Money found in bra, placed in baggie and placed with clothing in personal belongings bag and left at bedside.

## 2019-06-10 NOTE — ED Triage Notes (Signed)
Pt to ER via EMS from home, pt was found by children unresponsive.  Pt has hx of IV drug abuse and was found with syringes nearby.  Per FD pt sats were 48% initially and responded to pt being bagged.  Pt was given 2mg  IV narcan and is now maintaining her own airway.  Pt had vomited and possibly aspirated at that time.

## 2019-06-10 NOTE — H&P (Addendum)
[place] [facility] [place] NAME: Betty Gomez    MR#:  [id number]  DATE OF BIRTH:  09-13-1986  DATE OF ADMISSION:  06/10/2019  PRIMARY CARE PHYSICIAN: Patient, No Pcp Per   REQUESTING/REFERRING PHYSICIAN: Brenton Grills, MD  CHIEF COMPLAINT:   Chief Complaint  Patient presents with  . Drug Overdose    HISTORY OF PRESENT ILLNESS:  33 y.o female with pertinent past medical history of bipolar disorder, severe recurrent major depression without psychotic features, polysubstance abuse, anxiety, endometriosis, and asthma presenting to the ED with unresponsiveness.  Patient is currently altered therefore history mostly obtained from patient's chart.  Per ED reports patient arrived via EMS from home unresponsive, apparently she was found by her children unresponsive.  She has history of IV drug abuse and was found with syringes nearby.  Per ED reports she was due to be incarcerated on Monday for other unknown felony charges.  When EMS arrived sats were in the 48% which responded to patient being bagged.  Patient was given 2 mg IV Narcan and was able to maintain her airway.  She had an episode of vomiting and possible aspiration at that time.  On arrival to the ED, she was afebrile with blood pressure 129/83 mm Hg and pulse rate 89 beats/min.  Respiration 23 sats 93%.  There were no focal neurological deficits; she was sturporous, lethargic and hypoxic requiring multiple stimulation for arousal.  UDS positive for tricyclic, amphetamines, cannabinoids, benzodiazepine, and methadone.  Initial labs showed slightly elevated white count of 11.9 and unremarkable BMP.  X-ray showed no active cardiopulmonary process.  Patient will be admitted under hospitalist service for further management.  PAST MEDICAL HISTORY:   Past Medical History:  Diagnosis Date  . Anxiety   . Asthma   . Depression    ok, came off meds, has someone she sees for this.  . Endometriosis    . Infection    UTI    PAST SURGICAL HISTORY:   Past Surgical History:  Procedure Laterality Date  . DIAGNOSTIC LAPAROSCOPY     endometriosis  . TUBAL LIGATION N/A 09/17/2015   Procedure: POST PARTUM TUBAL LIGATION;  Surgeon: Malachy Mood, MD;  Location: [facility] ORS;  Service: Gynecology;  Laterality: N/A;  . TUBAL LIGATION Bilateral 09/21/2016   Procedure: POST PARTUM TUBAL LIGATION;  Surgeon: Gae Dry, MD;  Location: [facility] ORS;  Service: Gynecology;  Laterality: Bilateral;    SOCIAL HISTORY:   Social History   Tobacco Use  . Smoking status: Heavy Tobacco Smoker    Packs/day: 0.50    Years: 10.00    Pack years: 5.00    Types: Cigarettes  . Smokeless tobacco: Never Used  Substance Use Topics  . Alcohol use: No   FAMILY HISTORY:   Family History  Problem Relation Age of Onset  . Hypertension Mother   . Diabetes Mother   . Heart disease Mother 84       failure  . Heart failure Mother   . Depression Father   . Heart disease Maternal Grandfather   . Diabetes Paternal Grandmother   . Hypertension Paternal Grandmother   . Cancer Paternal Grandfather        lung  . Hearing loss Neg Hx    DRUG ALLERGIES:  No Known Allergies  REVIEW OF SYSTEMS:   ROS Unable to reliably obtain due to current altered mental status. MEDICATIONS AT HOME:   Prior to Admission medications   Not on  File   VITAL SIGNS:  Blood pressure (!) 120/92, pulse 97, temperature 97.9 F (36.6 C), temperature source Oral, resp. rate 19, height 5\' 6"  (1.676 m), weight 74.8 kg, SpO2 97 %, unknown if currently breastfeeding.  PHYSICAL EXAMINATION:   Physical Exam  GENERAL:  33 y.o.-year-old patient lying in the bed with no acute distress. On oxygen via nasal cannula EYES: Pupils equal, round, reactive to light and accommodation. No scleral icterus. Extraocular muscles intact.  HEENT: Head atraumatic, normocephalic. Oropharynx and nasopharynx clear.  NECK:  Supple, no jugular venous  distention. No thyroid enlargement, no tenderness.  LUNGS: Normal breath sounds bilaterally, no wheezing, rales,rhonchi or crepitation. No use of accessory muscles of respiration.  CARDIOVASCULAR: S1, S2 normal. No murmurs, rubs, or gallops.  ABDOMEN: Soft, nontender, nondistended. Bowel sounds present. No organomegaly or mass.  EXTREMITIES: No pedal edema, cyanosis, or clubbing. No rash or lesions. + pedal pulses MUSCULOSKELETAL: Normal bulk, and power was 5+ grip and elbow, knee, and ankle flexion and extension bilaterally.  NEUROLOGIC: Alert and oriented x 2. CN 2-12 intact. Sensation to light touch and cold stimuli intact bilaterally.  Unable to assess finger to nose nl. Babinski is downgoing. DTR's (biceps, patellar, and achilles) 2+ and symmetric throughout. Gait not tested due to safety concern. PSYCHIATRIC: The patient is alert and oriented x 1, Disoriented to place and situation.Marland Kitchen.  SKIN: No obvious rash, lesion, or ulcer.   DATA REVIEWED:  LABORATORY PANEL:   CBC Recent Labs  Lab 06/10/19 0811  WBC 11.9*  HGB 12.3  HCT 38.5  PLT 220   ------------------------------------------------------------------------------------------------------------------  Chemistries  Recent Labs  Lab 06/10/19 0811  NA 138  K 4.0  CL 105  CO2 27  GLUCOSE 111*  BUN 10  CREATININE 0.98  CALCIUM 8.8*   ------------------------------------------------------------------------------------------------------------------  Cardiac Enzymes No results for input(s): TROPONINI in the last 168 hours. ------------------------------------------------------------------------------------------------------------------  RADIOLOGY:  Dg Chest Portable 1 View  Result Date: 06/10/2019 CLINICAL DATA:  Unresponsive. EXAM: PORTABLE CHEST 1 VIEW COMPARISON:  Radiographs of November 17, 2018. FINDINGS: The heart size and mediastinal contours are within normal limits. Both lungs are clear. The visualized skeletal  structures are unremarkable. IMPRESSION: No active disease. Electronically Signed   By: Lupita RaiderJames  Green Jr M.D.   On: 06/10/2019 08:37   EKG:  EKG: normal EKG, normal sinus rhythm, unchanged from previous tracings. Vent. rate 92 BPM PR interval * ms QRS duration 86 ms QT/QTc 370/458 ms P-R-T axes 56 40 28 IMPRESSION AND PLAN:   33 y.o. female with pertinent past medical history of bipolar disorder, severe recurrent major depression without psychotic features, polysubstance abuse, anxiety, endometriosis, and asthma presenting to the ED with unresponsiveness.  1. Acute respiratory failure with hypoxia - secondary to drug overdose - Admit to medsurg unit with telemetry monitoring - Chest x-ray shows no active cardiopulmonary disease, will continue to monitor for aspiration pneumonia - Supplemental oxygen - Supportive therapy  - IVFs -Continue to monitor labs  2. Altered mental status - Likely due to drug overdose - now improved with narcan - Monitor CNS effects (e.g. somnolence, confusion) closely. - Seizure precaution - Psych consult for management of underlying mood disorder  3. Drug overdose - Avoid opiates during this admission - Narcan PRN  4.  Polysubstance abuse - Counseling provided, will need reiteration as she is currently lethargic - Social work referral  5.  History of bipolar disorder -Not on psych meds -Psych to follow  6. DVT prophylaxis - Enoxaparin  All the records are reviewed and case discussed with ED provider. Management plans discussed with the patient, family and they are in agreement.  CODE STATUS: FULL  TOTAL TIME TAKING CARE OF THIS PATIENT: 40 minutes.    on 06/10/2019 at 11:43 AM  Webb SilversmithElizabeth Ebonee Stober, DNP, FNP-BC [facility] Nurse Practitioner Between 7am to 6pm - Pager [phone]- [phone]  After 6pm go to [email or web address] - password [email or web address]  [facility] [employer] [facility]  Office  [phone]  CC: Primary care physician; Patient, No Pcp  Per

## 2019-06-10 NOTE — Progress Notes (Signed)
Patient overdosed and had to have Narcan to be aroused. Reported meth and heroin use.  On assessment this afternoon, patient was still drowsy and not able to provide a clear assessment, could not keep her eyes open.  "I don't feel good."  Will assess tomorrow when more responsive.  Waylan Boga, PMHNP

## 2019-06-11 LAB — BASIC METABOLIC PANEL
Anion gap: 4 — ABNORMAL LOW (ref 5–15)
BUN: 6 mg/dL (ref 6–20)
CO2: 28 mmol/L (ref 22–32)
Calcium: 7.7 mg/dL — ABNORMAL LOW (ref 8.9–10.3)
Chloride: 105 mmol/L (ref 98–111)
Creatinine, Ser: 0.83 mg/dL (ref 0.44–1.00)
GFR calc Af Amer: >60 mL/min (ref >60)
GFR calc non Af Amer: >60 mL/min (ref >60)
Glucose, Bld: 94 mg/dL (ref 70–99)
Potassium: 3.6 mmol/L (ref 3.5–5.1)
Sodium: 137 mmol/L (ref 135–145)

## 2019-06-11 LAB — CBC WITH DIFFERENTIAL/PLATELET
Abs Immature Granulocytes: 0.03 10*3/uL (ref 0.00–0.07)
Basophils Absolute: 0 10*3/uL (ref 0.0–0.1)
Basophils Relative: 0 %
Eosinophils Absolute: 0.2 10*3/uL (ref 0.0–0.5)
Eosinophils Relative: 4 %
HCT: 34.9 % — ABNORMAL LOW (ref 36.0–46.0)
Hemoglobin: 11 g/dL — ABNORMAL LOW (ref 12.0–15.0)
Immature Granulocytes: 1 %
Lymphocytes Relative: 22 %
Lymphs Abs: 1.4 10*3/uL (ref 0.7–4.0)
MCH: 30 pg (ref 26.0–34.0)
MCHC: 31.5 g/dL (ref 30.0–36.0)
MCV: 95.1 fL (ref 80.0–100.0)
Monocytes Absolute: 0.5 10*3/uL (ref 0.1–1.0)
Monocytes Relative: 8 %
Neutro Abs: 4.2 10*3/uL (ref 1.7–7.7)
Neutrophils Relative %: 65 %
Platelets: 182 10*3/uL (ref 150–400)
RBC: 3.67 MIL/uL — ABNORMAL LOW (ref 3.87–5.11)
RDW: 12.2 % (ref 11.5–15.5)
WBC: 6.4 10*3/uL (ref 4.0–10.5)
nRBC: 0 % (ref 0.0–0.2)

## 2019-06-11 MED ORDER — ALBUTEROL SULFATE (2.5 MG/3ML) 0.083% IN NEBU: 3.0000 mL | INHALATION_SOLUTION | Freq: Four times a day (QID) | RESPIRATORY_TRACT | Status: DC | PRN

## 2019-06-11 MED ORDER — BUDESONIDE 0.5 MG/2ML IN SUSP
0.5000 mg | Freq: Two times a day (BID) | RESPIRATORY_TRACT | Status: DC
  Administered 2019-06-11 – 2019-06-13 (×2): 0.5 mg via RESPIRATORY_TRACT
  Filled 2019-06-11 (×4): qty 2

## 2019-06-11 MED ORDER — ACETAMINOPHEN 325 MG PO TABS
650.0000 mg | ORAL_TABLET | Freq: Four times a day (QID) | ORAL | Status: DC | PRN
  Administered 2019-06-11 – 2019-06-12 (×2): 650 mg via ORAL
  Filled 2019-06-11 (×2): qty 2

## 2019-06-11 MED ORDER — ALBUTEROL SULFATE (2.5 MG/3ML) 0.083% IN NEBU
2.5000 mg | INHALATION_SOLUTION | Freq: Four times a day (QID) | RESPIRATORY_TRACT | Status: DC
  Administered 2019-06-11 – 2019-06-13 (×4): 2.5 mg via RESPIRATORY_TRACT
  Filled 2019-06-11 (×8): qty 3

## 2019-06-11 NOTE — Plan of Care (Signed)
Pt a&o x4.  Calm and cooperative. BP soft. VSS. IVF infusing.  O2 sats in the mid 90's on RA.  Pt ambulates to the bathroom. Awaiting psych consult.

## 2019-06-11 NOTE — Progress Notes (Signed)
Patient ID: Sanda KleinAshley Brooke Jupiter, female   DOB: 02-15-1986, 33 y.o.   MRN: [id number]  [employer] PROGRESS NOTE  [facility]hley [facility] QMV:[id number] DOB: 02-15-1986 DOA: 06/10/2019 PCP: Patient, No Pcp Per  HPI/Subjective: Patient feels that her asthma is acting up.  Some wheezing.  Patient states that she relapsed yesterday and last week.  She went through withdrawals last week.  She was brought in with an overdose and altered mental status.  She lives with her dad and grandma.  She states that she took some muscle relaxants some Xanax and Klonopin and some morphine.  Urine toxicology positive for amphetamines benzodiazepines cannabis and TCA.  Objective: Vitals:   06/11/19 0619 06/11/19 0830  BP: (!) 91/47 (!) 94/45  Pulse: 72 78  Resp:  18  Temp:    SpO2:  92%    Filed Weights   06/10/19 0816  Weight: 74.8 kg    ROS: Review of Systems  Constitutional: Negative for chills and fever.  Eyes: Negative for blurred vision.  Respiratory: Positive for shortness of breath. Negative for cough.   Cardiovascular: Negative for chest pain.  Gastrointestinal: Negative for abdominal pain, constipation, diarrhea, nausea and vomiting.  Genitourinary: Negative for dysuria.  Musculoskeletal: Negative for joint pain.  Neurological: Negative for dizziness and headaches.   Exam: Physical Exam  Constitutional: She is oriented to person, place, and time.  HENT:  Nose: No mucosal edema.  Mouth/Throat: No oropharyngeal exudate or posterior oropharyngeal edema.  Eyes: Pupils are equal, round, and reactive to light. Conjunctivae, EOM and lids are normal.  Neck: No JVD present. Carotid bruit is not present. No edema present. No thyroid mass and no thyromegaly present.  Cardiovascular: S1 normal and S2 normal. Exam reveals no gallop.  No murmur heard. Pulses:      Dorsalis pedis pulses are 2+ on the right side and 2+ on the left side.  Respiratory: No respiratory distress. She has decreased breath  sounds in the right middle field, the right lower field, the left middle field and the left lower field. She has wheezes in the right middle field, the right lower field, the left middle field and the left lower field. She has no rhonchi. She has no rales.  GI: Soft. Bowel sounds are normal. There is no abdominal tenderness.  Musculoskeletal:     Right ankle: She exhibits no swelling.     Left ankle: She exhibits no swelling.  Lymphadenopathy:    She has no cervical adenopathy.  Neurological: She is alert and oriented to person, place, and time. No cranial nerve deficit.  Skin: Skin is warm. No rash noted. Nails show no clubbing.  Psychiatric: She has a normal mood and affect.      Data Reviewed: Basic Metabolic Panel: Recent Labs  Lab 06/10/19 0811 06/11/19 0658  NA 138 137  K 4.0 3.6  CL 105 105  CO2 27 28  GLUCOSE 111* 94  BUN 10 6  CREATININE 0.98 0.83  CALCIUM 8.8* 7.7*   CBC: Recent Labs  Lab 06/10/19 0811 06/11/19 0658  WBC 11.9* 6.4  NEUTROABS 9.2* 4.2  HGB 12.3 11.0*  HCT 38.5 34.9*  MCV 93.9 95.1  PLT 220 182     Recent Results (from the past 240 hour(s))  SARS Coronavirus 2 (CEPHEID - Performed in [facility] lab), Hosp Order     Status: None   Collection Time: 06/10/19 10:53 AM   Specimen: Nasopharyngeal Swab  Result Value Ref Range Status   SARS  Coronavirus 2 NEGATIVE NEGATIVE Final    Comment: (NOTE) If result is NEGATIVE SARS-CoV-2 target nucleic acids are NOT DETECTED. The SARS-CoV-2 RNA is generally detectable in upper and lower  respiratory specimens during the acute phase of infection. The lowest  concentration of SARS-CoV-2 viral copies this assay can detect is 250  copies / mL. A negative result does not preclude SARS-CoV-2 infection  and should not be used as the sole basis for treatment or other  patient management decisions.  A negative result may occur with  improper specimen collection / handling, submission of specimen  other  than nasopharyngeal swab, presence of viral mutation(s) within the  areas targeted by this assay, and inadequate number of viral copies  (<250 copies / mL). A negative result must be combined with clinical  observations, patient history, and epidemiological information. If result is POSITIVE SARS-CoV-2 target nucleic acids are DETECTED. The SARS-CoV-2 RNA is generally detectable in upper and lower  respiratory specimens dur ing the acute phase of infection.  Positive  results are indicative of active infection with SARS-CoV-2.  Clinical  correlation with patient history and other diagnostic information is  necessary to determine patient infection status.  Positive results do  not rule out bacterial infection or co-infection with other viruses. If result is PRESUMPTIVE POSTIVE SARS-CoV-2 nucleic acids MAY BE PRESENT.   A presumptive positive result was obtained on the submitted specimen  and confirmed on repeat testing.  While 2019 novel coronavirus  (SARS-CoV-2) nucleic acids may be present in the submitted sample  additional confirmatory testing may be necessary for epidemiological  and / or clinical management purposes  to differentiate between  SARS-CoV-2 and other Sarbecovirus currently known to infect humans.  If clinically indicated additional testing with an alternate test  methodology [phone]) is advised. The SARS-CoV-2 RNA is generally  detectable in upper and lower respiratory sp ecimens during the acute  phase of infection. The expected result is Negative. Fact Sheet for Patients:  StrictlyIdeas.no Fact Sheet for Healthcare Providers: BankingDealers.co.za This test is not yet approved or cleared by the [place] FDA and has been authorized for detection and/or diagnosis of SARS-CoV-2 by FDA under an Emergency Use Authorization (EUA).  This EUA will remain in effect (meaning this test can be used) for the duration  of the COVID-19 declaration under Section 564(b)(1) of the Act, 21 U.S.C. section 360bbb-3(b)(1), unless the authorization is terminated or revoked sooner. Performed at [facility], [address], [place],  [address]      Studies: Dg Chest Portable 1 View  Result Date: 06/10/2019 CLINICAL DATA:  Unresponsive. EXAM: PORTABLE CHEST 1 VIEW COMPARISON:  Radiographs of November 17, 2018. FINDINGS: The heart size and mediastinal contours are within normal limits. Both lungs are clear. The visualized skeletal structures are unremarkable. IMPRESSION: No active disease. Electronically Signed   By: Marijo Conception M.D.   On: 06/10/2019 08:37    Scheduled Meds: . albuterol  2.5 mg Nebulization Q6H  . budesonide (PULMICORT) nebulizer solution  0.5 mg Nebulization BID  . enoxaparin (LOVENOX) injection  40 mg Subcutaneous Q24H   Continuous Infusions: . sodium chloride 75 mL/hr at 06/11/19 1119    Assessment/Plan:  1. Asthma exacerbation with wheeze.  Start budesonide and albuterol nebulizers.  Try to hold off on systemic steroids. 2. Drug overdose with altered mental status.  Mental status better today.  Urine toxicology positive for amphetamines, benzodiazepine, cannabis and TCA.  Patient states that she took muscle relaxers morphine,  Xanax and Klonopin and has used heroin.  Awaiting psychiatric reevaluation for disposition.  If psychiatry wants to take her downstairs she should be able to go downstairs tomorrow.  If psychiatry clears to go home may be able to be discharged tomorrow.  I mentioned to the patient that mortality after drug overdose is very high within the first year.  She has to decide whether she wants to be above ground or below ground. 3. Hepatitis C by laboratory data a couple years ago. 4. History of bipolar disorder.  Psych to evaluate.  Code Status:     Code Status Orders  (From admission, onward)         Start     Ordered   06/10/19 1113  Full code   Continuous     06/10/19 1115        Code Status History    Date Active Date Inactive Code Status Order ID Comments User Context   11/17/2018 0022 11/21/2018 1643 Full Code [id number]  Audery Amellapacs, John T, MD Inpatient   09/20/2016 0832 09/22/2016 1558 Full Code [id number]  Nadara MustardHarris, Robert P, MD Inpatient   09/20/2016 0559 09/20/2016 0832 Full Code [id number]  Nadara MustardHarris, Robert P, MD Inpatient   09/08/2016 1218 09/08/2016 1754 Full Code [id number]  Conard NovakJackson, Stephen D, MD Inpatient   07/24/2016 2056 07/25/2016 0905 Full Code [id number]  Vena AustriaStaebler, Andreas, MD Inpatient   09/16/2015 0334 09/17/2015 1644 Full Code [id number]  Farrel ConnersGutierrez, Colleen, CNM Inpatient   09/15/2015 1833 09/16/2015 0334 Full Code [id number]  Farrel ConnersGutierrez, Colleen, CNM Inpatient   09/08/2015 1028 09/08/2015 1517 Full Code [id number]  Marta AntuBrothers, Tamara, CNM Inpatient   08/16/2015 1844 08/16/2015 2151 Full Code [id number]  Vena AustriaStaebler, Andreas, MD Inpatient   05/24/2015 1253 05/24/2015 1612 Full Code [id number]  Nadara MustardHarris, Robert P, MD Inpatient   Advance Care Planning Activity     Family Communication: Declined me calling family.  Wanted me to speak with parole officer but she did not have the phone number. Disposition Plan: Potentially down to psych floor or discharged tomorrow.  Consultants:  Psychiatry  Time spent: 28 minutes.  Milk of the patient's nurse was present during the entire history and physical.  Alford Highlandichard Nayab Aten  [employer]

## 2019-06-11 NOTE — Progress Notes (Signed)
Patient ID: Betty Gomez, female   DOB: [birth year], 32 y.o.   MRN: [id number]  Tried calling Officer Charisse March at [phone] at patient's request.  I left a message on voicemail.  Dr Loletha Grayer

## 2019-06-12 DIAGNOSIS — T50902A Poisoning by unspecified drugs, medicaments and biological substances, intentional self-harm, initial encounter: Secondary | ICD-10-CM

## 2019-06-12 DIAGNOSIS — J45901 Unspecified asthma with (acute) exacerbation: Secondary | ICD-10-CM

## 2019-06-12 DIAGNOSIS — R4182 Altered mental status, unspecified: Secondary | ICD-10-CM

## 2019-06-12 MED ORDER — QVAR REDIHALER 40 MCG/ACT IN AERB: 2.0000 | INHALATION_SPRAY | Freq: Two times a day (BID) | RESPIRATORY_TRACT | 0 refills | Status: DC

## 2019-06-12 MED ORDER — NALOXONE HCL 4 MG/0.1ML NA LIQD: NASAL | 0 refills | Status: AC

## 2019-06-12 MED ORDER — ALBUTEROL SULFATE HFA 108 (90 BASE) MCG/ACT IN AERS: 2.0000 | INHALATION_SPRAY | Freq: Four times a day (QID) | RESPIRATORY_TRACT | 0 refills | Status: DC | PRN

## 2019-06-12 NOTE — Discharge Summary (Signed)
[facility] [place] NAME: Betty Gomez    MR#:  [id number]  DATE OF BIRTH:  1986/07/01  DATE OF ADMISSION:  06/10/2019 ADMITTING PHYSICIAN: Hillary Bow, MD  DATE OF DISCHARGE: 06/12/2019  PRIMARY CARE PHYSICIAN: Patient, No Pcp Per    ADMISSION DIAGNOSIS:  Polysubstance abuse ([place]) [F19.10] Acute respiratory failure with hypoxia (H[facility]) [J96.01] Drug overdose, undetermined intent, initial encounter [T50.904A]  DISCHARGE DIAGNOSIS:  Active Problems:   Drug overdose, intentional, initial encounter ([place])   SECONDARY DIAGNOSIS:   Past Medical History:  Diagnosis Date  . Anxiety   . Asthma   . Depression    ok, came off meds, has someone she sees for this.  . Endometriosis   . Infection    UTI    HOSPITAL COURSE:   1.  Drug overdose with altered mental status.  Mental status better the day after admission.  Urine toxicology positive for amphetamines, benzodiazepine, cannabis and TCA.  Patient states that she took some muscle relaxants, morphine, Xanax and Klonopin.  In the past she is used heroin.  Psychiatry saw her and would like to monitor her longer on the psychiatric floor. 2.  Asthma exacerbation with wheeze.  I started nebulizers yesterday and lungs are clear today.  Recommend albuterol inhaler and any steroid inhaler.  I did prescribe Qvar into her pharmacy upon going home.  Not sure if the pharmacy carries Qvar here.  Any steroid inhaler would do.  3.  Hepatitis C by laboratory data couple years ago.  Would recommend gastrointestinal follow-up as outpatient. 4.  History of bipolar disorder.  Appreciate psychiatric evaluation.  DISCHARGE CONDITIONS:   Satisfactory  CONSULTS OBTAINED:  Treatment Team:  Patrecia Pour, NP Elvin So, MD  DRUG ALLERGIES:  No Known Allergies  DISCHARGE MEDICATIONS:   Allergies as of 06/12/2019   No Known Allergies     Medication List    TAKE these medications   albuterol 108 (90  Base) MCG/ACT inhaler Commonly known as: VENTOLIN HFA Inhale 2 puffs into the lungs every 6 (six) hours as needed for wheezing or shortness of breath.   naloxone 4 MG/0.1ML Liqd nasal spray kit Commonly known as: NARCAN One spray for overdose   Qvar RediHaler 40 MCG/ACT inhaler Generic drug: beclomethasone Inhale 2 puffs into the lungs 2 (two) times daily.        DISCHARGE INSTRUCTIONS:   Follow-up psychiatry team 1 day  If you experience worsening of your admission symptoms, develop shortness of breath, life threatening emergency, suicidal or homicidal thoughts you must seek medical attention immediately by calling 911 or calling your MD immediately  if symptoms less severe.  You Must read complete instructions/literature along with all the possible adverse reactions/side effects for all the Medicines you take and that have been prescribed to you. Take any new Medicines after you have completely understood and accept all the possible adverse reactions/side effects.   Please note  You were cared for by a hospitalist during your hospital stay. If you have any questions about your discharge medications or the care you received while you were in the hospital after you are discharged, you can call the unit and asked to speak with the hospitalist on call if the hospitalist that took care of you is not available. Once you are discharged, your primary care physician will handle any further medical issues. Please note that NO REFILLS for any discharge medications will be authorized once you are discharged, as it  is imperative that you return to your primary care physician (or establish a relationship with a primary care physician if you do not have one) for your aftercare needs so that they can reassess your need for medications and monitor your lab values.    Today   CHIEF COMPLAINT:   Chief Complaint  Patient presents with  . Drug Overdose    HISTORY OF PRESENT ILLNESS:  Betty Gomez  is a 33 y.o. female came in with drug overdose   VITAL SIGNS:  Blood pressure 100/68, pulse 75, temperature 98.4 F (36.9 C), temperature source Oral, resp. rate 18, height _0  (1.676 m), weight 74.8 kg, SpO2 97 %, unknown if currently breastfeeding.   PHYSICAL EXAMINATION:  GENERAL:  33 y.o.-year-old patient lying in the bed with no acute distress.  EYES: Pupils equal, round, reactive to light and accommodation. No scleral icterus. Extraocular muscles intact.  HEENT: Head atraumatic, normocephalic. Oropharynx and nasopharynx clear.  NECK:  Supple, no jugular venous distention. No thyroid enlargement, no tenderness.  LUNGS: Normal breath sounds bilaterally, no wheezing, rales,rhonchi or crepitation. No use of accessory muscles of respiration.  CARDIOVASCULAR: S1, S2 normal. No murmurs, rubs, or gallops.  ABDOMEN: Soft, non-tender, non-distended. Bowel sounds present. No organomegaly or mass.  EXTREMITIES: No pedal edema, cyanosis, or clubbing.  NEUROLOGIC: Cranial nerves II through XII are intact. Muscle strength 5/5 in all extremities. Sensation intact. Gait not checked.  PSYCHIATRIC: The patient is alert and oriented x 3.  SKIN: No obvious rash, lesion, or ulcer.   DATA REVIEW:   CBC Recent Labs  Lab 06/11/19 0658  WBC 6.4  HGB 11.0*  HCT 34.9*  PLT 182    Chemistries  Recent Labs  Lab 06/11/19 0658  NA 137  K 3.6  CL 105  CO2 28  GLUCOSE 94  BUN 6  CREATININE 0.83  CALCIUM 7.7*     Microbiology Results  Results for orders placed or performed during the hospital encounter of 06/10/19  SARS Coronavirus 2 (CEPHEID - Performed in [place] hospital lab), Hosp Order     Status: None   Collection Time: 06/10/19 10:53 AM   Specimen: Nasopharyngeal Swab  Result Value Ref Range Status   SARS Coronavirus 2 NEGATIVE NEGATIVE Final    Comment: (NOTE) If result is NEGATIVE SARS-CoV-2 target nucleic acids are NOT DETECTED. The SARS-CoV-2 RNA is generally  detectable in upper and lower  respiratory specimens during the acute phase of infection. The lowest  concentration of SARS-CoV-2 viral copies this assay can detect is 250  copies / mL. A negative result does not preclude SARS-CoV-2 infection  and should not be used as the sole basis for treatment or other  patient management decisions.  A negative result may occur with  improper specimen collection / handling, submission of specimen other  than nasopharyngeal swab, presence of viral mutation(s) within the  areas targeted by this assay, and inadequate number of viral copies  (<250 copies / mL). A negative result must be combined with clinical  observations, patient history, and epidemiological information. If result is POSITIVE SARS-CoV-2 target nucleic acids are DETECTED. The SARS-CoV-2 RNA is generally detectable in upper and lower  respiratory specimens dur ing the acute phase of infection.  Positive  results are indicative of active infection with SARS-CoV-2.  Clinical  correlation with patient history and other diagnostic information is  necessary to determine patient infection status.  Positive results do  not rule out bacterial infection or co-infection with  other viruses. If result is PRESUMPTIVE POSTIVE SARS-CoV-2 nucleic acids MAY BE PRESENT.   A presumptive positive result was obtained on the submitted specimen  and confirmed on repeat testing.  While 2019 novel coronavirus  (SARS-CoV-2) nucleic acids may be present in the submitted sample  additional confirmatory testing may be necessary for epidemiological  and / or clinical management purposes  to differentiate between  SARS-CoV-2 and other Sarbecovirus currently known to infect humans.  If clinically indicated additional testing with an alternate test  methodology [phone]) is advised. The SARS-CoV-2 RNA is generally  detectable in upper and lower respiratory sp ecimens during the acute  phase of infection. The  expected result is Negative. Fact Sheet for Patients:  StrictlyIdeas.no Fact Sheet for Healthcare Providers: BankingDealers.co.za This test is not yet approved or cleared by the [place] FDA and has been authorized for detection and/or diagnosis of SARS-CoV-2 by FDA under an Emergency Use Authorization (EUA).  This EUA will remain in effect (meaning this test can be used) for the duration of the COVID-19 declaration under Section 564(b)(1) of the Act, 21 U.S.C. section 360bbb-3(b)(1), unless the authorization is terminated or revoked sooner. Performed at [facility], [address] [place], [place] [address]       Management plans discussed with the patient, and she is in agreement.  Left message for the patient's mother.  CODE STATUS:     Code Status Orders  (From admission, onward)         Start     Ordered   06/10/19 1113  Full code  Continuous     06/10/19 1115        Code Status History    Date Active Date Inactive Code Status Order ID Comments User Context   11/17/2018 0022 11/21/2018 1643 Full Code [id number]  Gonzella Lex, MD Inpatient   09/20/2016 0832 09/22/2016 1558 Full Code [id number]  Gae Dry, MD Inpatient   09/20/2016 0559 09/20/2016 0832 Full Code [id number]  Gae Dry, MD Inpatient   09/08/2016 1218 09/08/2016 1754 Full Code [id number]  Will Bonnet, MD Inpatient   07/24/2016 2056 07/25/2016 0905 Full Code [id number]  Malachy Mood, MD Inpatient   09/16/2015 0334 09/17/2015 [employer] Full Code [id number]  Dalia Heading, [place] Inpatient   09/15/2015 1833 09/16/2015 [place] Full Code [id number]  Dalia Heading, [place] Inpatient   09/08/2015 1028 09/08/2015 1517 Full Code [id number]  Burlene Arnt, [place] Inpatient   08/16/2015 1844 08/16/2015 2151 Full Code [id number]  Malachy Mood, MD Inpatient   05/24/2015 1253 05/24/2015 1612 Full Code [id number]  Gae Dry, MD Inpatient    Advance Care Planning Activity      TOTAL TIME TAKING CARE OF THIS PATIENT: 35 minutes.    Loletha Grayer M.D on 06/12/2019 at 12:20 PM  Between 7am to 6pm - Pager - [phone]  After 6pm go to [email or web address] - password [email or web address] [facility]  [facility] [employer] Office  [phone]  CC: Primary care physician; Patient, No Pcp Per

## 2019-06-12 NOTE — Progress Notes (Signed)
Per [facility], there are no inpatient beds available. Madlyn Frankel, RN

## 2019-06-12 NOTE — Progress Notes (Signed)
Patient ID: Betty Gomez, female   DOB: 19-May-1986, 33 y.o.   MRN: [id number]  [facility] PROGRESS NOTE  Betty Gomez [facility] Betty Gomez DOB: Apr 10, 1986 Betty Gomez Betty Gomez  HPI/Subjective: Patient feels worn out and just needs to rest.  States her breathing is better.  Offers no other complaints.  Objective: Vitals:   06/11/19 2057 06/12/19 0453  BP: (!) 105/51 100/68  Pulse: 93 75  Resp:    Temp: 98.5 F (36.9 C) 98.4 F (36.9 C)  SpO2: 98% 97%    Filed Weights   06/10/19 0816  Weight: 74.8 kg    ROS: Review of Systems  Constitutional: Positive for malaise/fatigue. Negative for chills and fever.  Eyes: Negative for blurred vision.  Respiratory: Negative for cough and shortness of breath.   Cardiovascular: Negative for chest pain.  Gastrointestinal: Negative for abdominal pain, constipation, diarrhea, nausea and vomiting.  Genitourinary: Negative for dysuria.  Musculoskeletal: Negative for joint pain.  Neurological: Negative for dizziness and headaches.   Exam: Physical Exam  Constitutional: She is oriented to person, place, and time.  HENT:  Nose: No mucosal edema.  Mouth/Throat: No oropharyngeal exudate or posterior oropharyngeal edema.  Eyes: Pupils are equal, round, and reactive to light. Conjunctivae, EOM and lids are normal.  Neck: No JVD present. Carotid bruit is not present. No edema present. No thyroid mass and no thyromegaly present.  Cardiovascular: S1 normal and S2 normal. Exam reveals no gallop.  No murmur heard. Pulses:      Dorsalis pedis pulses are 2+ on the right side and 2+ on the left side.  Respiratory: No respiratory distress. She has no decreased breath sounds. She has no wheezes. She has no rhonchi. She has no rales.  GI: Soft. Bowel sounds are normal. There is no abdominal tenderness.  Musculoskeletal:     Right ankle: She exhibits no swelling.     Left ankle: She exhibits no swelling.  Lymphadenopathy:     She has no cervical adenopathy.  Neurological: She is alert and oriented to person, place, and time. No cranial nerve deficit.  Skin: Skin is warm. No rash noted. Nails show no clubbing.  Psychiatric: She has a normal mood and affect.      Data Reviewed: Basic Metabolic Panel: Recent Labs  Lab 06/10/19 0811 06/11/19 0658  NA 138 137  K 4.0 3.6  CL 105 105  CO2 27 28  GLUCOSE 111* 94  BUN 10 6  CREATININE 0.98 0.83  CALCIUM 8.8* 7.7*   CBC: Recent Labs  Lab 06/10/19 0811 06/11/19 0658  WBC 11.9* 6.4  NEUTROABS 9.2* 4.2  HGB 12.3 11.0*  HCT 38.5 34.9*  MCV 93.9 95.1  PLT 220 182     Recent Results (from the past 240 hour(s))  SARS Coronavirus 2 (CEPHEID - Performed in [place] hospital lab), Hosp Order     Status: None   Collection Time: 06/10/19 10:53 AM   Specimen: Nasopharyngeal Swab  Result Value Ref Range Status   SARS Coronavirus 2 NEGATIVE NEGATIVE Final    Comment: (NOTE) If result is NEGATIVE SARS-CoV-2 target nucleic acids are NOT DETECTED. The SARS-CoV-2 RNA is generally detectable in upper and lower  respiratory specimens during the acute phase of infection. The lowest  concentration of SARS-CoV-2 viral copies this assay can detect is 250  copies / mL. A negative result does not preclude SARS-CoV-2 infection  and should not be used as the sole basis for treatment or other  patient management decisions.  A negative result may occur with  improper specimen collection / handling, submission of specimen other  than nasopharyngeal swab, presence of viral mutation(s) within the  areas targeted by this assay, and inadequate number of viral copies  (<250 copies / mL). A negative result must be combined with clinical  observations, patient history, and epidemiological information. If result is POSITIVE SARS-CoV-2 target nucleic acids are DETECTED. The SARS-CoV-2 RNA is generally detectable in upper and lower  respiratory specimens dur ing the acute  phase of infection.  Positive  results are indicative of active infection with SARS-CoV-2.  Clinical  correlation with patient history and other diagnostic information is  necessary to determine patient infection status.  Positive results do  not rule out bacterial infection or co-infection with other viruses. If result is PRESUMPTIVE POSTIVE SARS-CoV-2 nucleic acids MAY BE PRESENT.   A presumptive positive result was obtained on the submitted specimen  and confirmed on repeat testing.  While 2019 novel coronavirus  (SARS-CoV-2) nucleic acids may be present in the submitted sample  additional confirmatory testing may be necessary for epidemiological  and / or clinical management purposes  to differentiate between  SARS-CoV-2 and other Sarbecovirus currently known to infect humans.  If clinically indicated additional testing with an alternate test  methodology [phone]([id number]) is advised. The SARS-CoV-2 RNA is generally  detectable in upper and lower respiratory sp ecimens during the acute  phase of infection. The expected result is Negative. Fact Sheet for Patients:  BoilerBrush.com.cyhttps://www.fda.gov/media/136312/download Fact Sheet for Healthcare Providers: https://pope.com/https://www.fda.gov/media/136313/download This test is not yet approved or cleared by the [place] FDA and has been authorized for detection and/or diagnosis of SARS-CoV-2 by FDA under an Emergency Use Authorization (EUA).  This EUA will remain in effect (meaning this test can be used) for the duration of the COVID-19 declaration under Section 564(b)(1) of the Act, 21 U.S.C. section 360bbb-3(b)(1), unless the authorization is terminated or revoked sooner. Performed at [facility], [address] [place], [place] [address]      Scheduled Meds: . albuterol  2.5 mg Nebulization Q6H  . budesonide (PULMICORT) nebulizer solution  0.5 mg Nebulization BID  . enoxaparin (LOVENOX) injection  40 mg Subcutaneous Q24H   Continuous  Infusions:   Assessment/Plan:  1. Asthma exacerbation with wheeze.  Lungs clear.  Prescribed albuterol inhaler and Qvar upon going home.  Continue nebulizers while here in the hospital. 2. Drug overdose with altered mental status.  Mental status better today.  Psychiatry saw the patient and they want to take her downstairs and unfortunately there are no beds today but hopefully we will be able to get her to the psychiatry floor tomorrow. 3. Hepatitis C by laboratory data a couple years ago. 4. History of bipolar disorder.  Psych to evaluate.  Code Status:     Code Status Orders  (From admission, onward)         Start     Ordered   06/10/19 1113  Full code  Continuous     06/10/19 1115        Code Status History    Date Active Date Inactive Code Status Order ID Comments User Context   11/17/2018 0022 11/21/2018 1643 Full Code [id number]  Audery Amellapacs, John T, MD Inpatient   09/20/2016 0832 09/22/2016 1558 Full Code [id number]  Nadara MustardHarris, Robert P, MD Inpatient   09/20/2016 0559 09/20/2016 0832 Full Code [id number]  Nadara MustardHarris, Robert P, MD Inpatient   09/08/2016 1218 09/08/2016 1754 Full Code  [id number]  Conard NovakJackson, Stephen D, MD Inpatient   07/24/2016 2056 07/25/2016 0905 Full Code [id number]  Vena AustriaStaebler, Andreas, MD Inpatient   09/16/2015 0334 09/17/2015 1644 Full Code [id number]  Farrel ConnersGutierrez, Colleen, CNM Inpatient   09/15/2015 1833 09/16/2015 0334 Full Code [id number]  Farrel ConnersGutierrez, Colleen, CNM Inpatient   09/08/2015 1028 09/08/2015 1517 Full Code [id number]  Marta AntuBrothers, Tamara, CNM Inpatient   08/16/2015 1844 08/16/2015 2151 Full Code [id number]  Vena AustriaStaebler, Andreas, MD Inpatient   05/24/2015 1253 05/24/2015 1612 Full Code [id number]  Nadara MustardHarris, Robert P, MD Inpatient   Advance Care Planning Activity     Family Communication: Left message for the patient's mother Disposition Plan: Discharge to psychiatry floor tomorrow.  Consultants:  Psychiatry  Time spent: 32 minutes.    Kailey Esquilin [employer]ieting  [facility]

## 2019-06-12 NOTE — Consult Note (Signed)
[facility] Face[facility] Consult   Reason for Consult:  Overdose on multiple substances Referring Physician:  Dr.Wieting [name]:  [id number] Principal Diagnosis: <principal problem not specified> Diagnosis:  Active Problems:   Drug overdose, intentional, initial encounter (HCC)   Total Time spent with patient: 20 minutes  Subjective:   Betty Gomez is a 33 y.o. female patient admitted after she was found unresponsive and had to be given 2 mg of IM Narcan.  Patient has a history of polysubstance abuse and bipolar disorder.  HPI: Patient is a 33 year old female with a history of depression and bipolar disorder in the context of polysubstance abuse who was found unresponsive this past weekend and first responders resuscitated patient after giving IM Narcan.  Patient has a history of using multiple substances including IV heroin, methamphetamines, cocaine, cannabis and alcohol. Psychiatry was consulted to assess the patient's overdose on substances if it was intentional or accidental.  Patient today reports that she felt like she had enough and had a court date coming up and kept using it because she was not feeling good.  States that she did not realize it would lead to such severe consequences medically. Patient does endorse significant depression.  She is currently unemployed.  She is living with her sister right now.  She has 4 children ages 12 7 3  and 2.  They are currently with her mother.  States that she is married but they are separated now. [facility] and is on multiple medications of Prozac, Abilify, gabapentin and trazodone.  However she was not taking her medications for the last 2 weeks. Patient does not appear to have any psychotic symptoms currently.  She denies any current suicidal or homicidal ideations.  However she has several risk factors with her polysubstance dependence, long history of mood disorder  that she has been noncompliant with treatment and upcoming court date for which she might be jailed due to possession of her schedule II substances.  Past Psychiatric History: Patient reports she was hospitalized twice in the past to behavioral health unit.  Risk to Self:  Yes Risk to Others:  None Prior Inpatient Therapy:  Yes Prior Outpatient Therapy:  Yes  Past Medical History:  Past Medical History:  Diagnosis Date  . Anxiety   . Asthma   . Depression    ok, came off meds, has someone she sees for this.  . Endometriosis   . Infection    UTI    Past Surgical History:  Procedure Laterality Date  . DIAGNOSTIC LAPAROSCOPY     endometriosis  . TUBAL LIGATION N/A 09/17/2015   Procedure: POST PARTUM TUBAL LIGATION;  Surgeon: Vena AustriaAndreas Staebler, MD;  Location: [facility] ORS;  Service: Gynecology;  Laterality: N/A;  . TUBAL LIGATION Bilateral 09/21/2016   Procedure: POST PARTUM TUBAL LIGATION;  Surgeon: Nadara Mustardobert P Harris, MD;  Location: [facility] ORS;  Service: Gynecology;  Laterality: Bilateral;   Family History:  Family History  Problem Relation Age of Onset  . Hypertension Mother   . Diabetes Mother   . Heart disease Mother 3534       failure  . Heart failure Mother   . Depression Father   . Heart disease Maternal Grandfather   . Diabetes Paternal Grandmother   . Hypertension Paternal Grandmother   . Cancer Paternal Grandfather        lung  . Hearing loss Neg Hx    Family Psychiatric  History: Unknown Social History:  Social History   Substance and Sexual Activity  Alcohol Use No     Social History   Substance and Sexual Activity  Drug Use Yes  . Types: Marijuana, Methamphetamines, Heroin   Comment: heroin    Social History   Socioeconomic History  . Marital status: Married    Spouse name: Not on file  . Number of children: Not on file  . Years of education: Not on file  . Highest education level: Not on file  Occupational History  . Not on file  Social Needs  .  Financial resource strain: Not on file  . Food insecurity    Worry: Not on file    Inability: Not on file  . Transportation needs    Medical: Not on file    Non-medical: Not on file  Tobacco Use  . Smoking status: Heavy Tobacco Smoker    Packs/day: 0.50    Years: 10.00    Pack years: 5.00    Types: Cigarettes  . Smokeless tobacco: Never Used  Substance and Sexual Activity  . Alcohol use: No  . Drug use: Yes    Types: Marijuana, Methamphetamines, Heroin    Comment: heroin  . Sexual activity: Yes  Lifestyle  . Physical activity    Days per week: Not on file    Minutes per session: Not on file  . Stress: Not on file  Relationships  . Social Musicianconnections    Talks on phone: Not on file    Gets together: Not on file    Attends religious service: Not on file    Active member of club or organization: Not on file    Attends meetings of clubs or organizations: Not on file    Relationship status: Not on file  Other Topics Concern  . Not on file  Social History Narrative  . Not on file   Additional Social History: Patient has 4 children who are currently living with her mother.    Allergies:  No Known Allergies  Labs:  Results for orders placed or performed during the hospital encounter of 06/10/19 (from the past 48 hour(s))  CBC with Differential/Platelet     Status: Abnormal   Collection Time: 06/11/19  6:58 AM  Result Value Ref Range   WBC 6.4 4.0 - 10.5 K/uL   RBC 3.67 (L) 3.87 - 5.11 MIL/uL   Hemoglobin 11.0 (L) 12.0 - 15.0 g/dL   HCT 09.834.9 (L) 11.936.0 - 14.746.0 %   MCV 95.1 80.0 - 100.0 fL   MCH 30.0 26.0 - 34.0 pg   MCHC 31.5 30.0 - 36.0 g/dL   RDW 82.912.2 56.211.5 - 13.015.5 %   Platelets 182 150 - 400 K/uL   nRBC 0.0 0.0 - 0.2 %   Neutrophils Relative % 65 %   Neutro Abs 4.2 1.7 - 7.7 K/uL   Lymphocytes Relative 22 %   Lymphs Abs 1.4 0.7 - 4.0 K/uL   Monocytes Relative 8 %   Monocytes Absolute 0.5 0.1 - 1.0 K/uL   Eosinophils Relative 4 %   Eosinophils Absolute 0.2 0.0 -  0.5 K/uL   Basophils Relative 0 %   Basophils Absolute 0.0 0.0 - 0.1 K/uL   Immature Granulocytes 1 %   Abs Immature Granulocytes 0.03 0.00 - 0.07 K/uL    Comment: Performed at [facility], [address] [place], [place] [address]  Basic metabolic panel     Status: Abnormal   Collection Time: 06/11/19  6:58 AM  Result  Value Ref Range   Sodium 137 135 - 145 mmol/L   Potassium 3.6 3.5 - 5.1 mmol/L   Chloride 105 98 - 111 mmol/L   CO2 28 22 - 32 mmol/L   Glucose, Bld 94 70 - 99 mg/dL   BUN 6 6 - 20 mg/dL   Creatinine, Ser 0.83 0.44 - 1.00 mg/dL   Calcium 7.7 (L) 8.9 - 10.3 mg/dL   GFR calc non Af Amer >60 >60 mL/min   GFR calc Af Amer >60 >60 mL/min   Anion gap 4 (L) 5 - 15    Comment: Performed at [facility], [address] [place], [place] [address]    Current Facility-Administered Medications  Medication Dose Route Frequency Provider Last Rate Last Dose  . acetaminophen (TYLENOL) tablet 650 mg  650 mg Oral Q6H PRN Loletha Grayer, MD   650 mg at 06/12/19 7341  . albuterol (PROVENTIL) (2.5 MG/3ML) 0.083% nebulizer solution 2.5 mg  2.5 mg Nebulization Q6H Wieting, Richard, MD   2.5 mg at 06/12/19 0203  . budesonide (PULMICORT) nebulizer solution 0.5 mg  0.5 mg Nebulization BID Loletha Grayer, MD   0.5 mg at 06/11/19 1952  . enoxaparin (LOVENOX) injection 40 mg  40 mg Subcutaneous Q24H Lang Snow, NP   40 mg at 06/11/19 1120  . naloxone [facility]) injection 0.4 mg  0.4 mg Intravenous PRN Lang Snow, NP        Musculoskeletal: Strength & Muscle Tone: within normal limits Gait & Station: normal Patient leans: N/A  Psychiatric Specialty Exam: Physical Exam  ROS  Blood pressure 100/68, pulse 75, temperature 98.4 F (36.9 C), temperature source Oral, resp. rate 18, height 5\' 6"  (1.676 m), weight 74.8 kg, SpO2 97 %, unknown if currently breastfeeding.Body mass index is 26.62 kg/m.  General Appearance: Disheveled  Eye Contact:   Minimal  Speech:  Slow  Volume:  Decreased  Mood:  Depressed and Irritable  Affect:  Constricted and Flat  Thought Process:  Coherent  Orientation:  Full (Time, Place, and Person)  Thought Content:  Logical  Suicidal Thoughts:  Yes.  with intent/plan  Homicidal Thoughts:  No  Memory:  Immediate;   Fair Recent;   Fair Remote;   Fair  Judgement:  Impaired  Insight:  Shallow  Psychomotor Activity:  Normal  Concentration:  Concentration: Fair and Attention Span: Fair  Recall:  [employer] of Knowledge:  Fair  Language:  Fair  Akathisia:  No  Handed:  Right  AIMS (if indicated):     Assets:  Communication Skills  ADL's:  Impaired  Cognition:  WNL  Sleep:        Treatment Plan Summary: Daily contact with patient to assess and evaluate symptoms and progress in treatment and Medication management  Patient is a 33 year old woman with a long history of polysubstance dependence,, bipolar disorder admitted with an overdose requiring Narcan when she was found unresponsive.  Patient has multiple risk factors including noncompliance with her medications, lack of follow-up with outpatient care and this current intentional overdose. She will need inpatient treatment for stabilization and ongoing care.  Disposition: Recommend psychiatric Inpatient admission when medically cleared.  Elvin So, MD 06/12/2019 12:58 PM

## 2019-06-13 ENCOUNTER — Encounter: Payer: Self-pay | Admitting: *Deleted

## 2019-06-13 ENCOUNTER — Other Ambulatory Visit: Payer: Self-pay

## 2019-06-13 DIAGNOSIS — F1910 Other psychoactive substance abuse, uncomplicated: Secondary | ICD-10-CM

## 2019-06-13 DIAGNOSIS — F329 Major depressive disorder, single episode, unspecified: Secondary | ICD-10-CM | POA: Insufficient documentation

## 2019-06-13 DIAGNOSIS — F17210 Nicotine dependence, cigarettes, uncomplicated: Secondary | ICD-10-CM | POA: Insufficient documentation

## 2019-06-13 DIAGNOSIS — T50992A Poisoning by other drugs, medicaments and biological substances, intentional self-harm, initial encounter: Secondary | ICD-10-CM

## 2019-06-13 DIAGNOSIS — Z79899 Other long term (current) drug therapy: Secondary | ICD-10-CM | POA: Insufficient documentation

## 2019-06-13 DIAGNOSIS — Z20828 Contact with and (suspected) exposure to other viral communicable diseases: Secondary | ICD-10-CM | POA: Insufficient documentation

## 2019-06-13 DIAGNOSIS — J45909 Unspecified asthma, uncomplicated: Secondary | ICD-10-CM | POA: Insufficient documentation

## 2019-06-13 DIAGNOSIS — R45851 Suicidal ideations: Secondary | ICD-10-CM | POA: Insufficient documentation

## 2019-06-13 NOTE — ED Notes (Signed)
Pt belongings: shirt, bra, pants, shoes, underwear, silver colored ring, eyebrow ring/bar,  And book bag. EDT didn't go through backpack.  Pink hair tie and  And two bracelets that pt stated they let her keep last time  She was her due to the fact that they have to be cut off.

## 2019-06-13 NOTE — Discharge Summary (Addendum)
[facility] [place] NAME: Betty Gomez    MR#:  [id number]  DATE OF BIRTH:  Sep 03, 1986  DATE OF ADMISSION:  06/10/2019   ADMITTING PHYSICIAN: Hillary Bow, MD  DATE OF DISCHARGE: 06/13/2019  PRIMARY CARE PHYSICIAN: Patient, No Pcp Per   ADMISSION DIAGNOSIS:  Polysubstance abuse ([place]) [F19.10] Acute respiratory failure with hypoxia (HCC) [J96.01] Drug overdose, undetermined intent, initial encounter [T50.904A] DISCHARGE DIAGNOSIS:  Active Problems:   Drug overdose, intentional, initial encounter ([place])  SECONDARY DIAGNOSIS:   Past Medical History:  Diagnosis Date  . Anxiety   . Asthma   . Depression    ok, came off meds, has someone she sees for this.  . Endometriosis   . Infection    UTI   HOSPITAL COURSE:  Chief complaint; drug overdose  History of presenting complaint; 33 y.o female with pertinent past medical history of bipolar disorder, severe recurrent major depression without psychotic features, polysubstance abuse, anxiety, endometriosis, and asthma presenting to the ED with unresponsiveness. There were no focal neurological deficits; she was sturporous, lethargic and hypoxic requiring multiple stimulation for arousal.  UDS positive for tricyclic, amphetamines, cannabinoids, benzodiazepine, and methadone.  Patient was diagnosed with drug overdose and admitted to medical service for further evaluation.  Hospital course; 1. Asthma exacerbation with wheeze.  Lungs clear.  Prescribed albuterol inhaler and Qvar upon going home.  Significantly improved clinically.  No evidence of exacerbation at this time. 2. Drug overdose with altered mental status.  Patient initially admitted with drug overdose.  Patient awake and alert and oriented this morning.  Seen by psychiatry service by Dr. Einar Grad previously and she recommended inpatient psychiatric unit.  I was called by psychiatry nurse practitioner on call today; Darnelle Maffucci reevaluated  patient today and discussed case with psychiatrist on call Dr. Weber Cooks and patient denied being suicidal or homicidal.  They also discussed with patient's grandmother who confirmed patient has no prior suicidal ideations and was only making bad decisions by using drugs.  Patient's grandmother wishes to pick patient up and safety plan was discussed with her.  Patient cleared for discharge home from psychiatric standpoint since no evidence of any imminent risk to self or others at this time.  Outpatient resources provided by case manager.  Patient clinically and hemodynamically stable and plan is for discharge home with family.Hepatitis C by laboratory data a couple years ago. 3. History of bipolar disorder.    Stable.  4.   DISCHARGE CONDITIONS:  Stable CONSULTS OBTAINED:  Treatment Team:  Patrecia Pour, NP Elvin So, MD Esther Hardy, PhD DRUG ALLERGIES:  No Known Allergies DISCHARGE MEDICATIONS:   Allergies as of 06/13/2019   No Known Allergies     Medication List    TAKE these medications   albuterol 108 (90 Base) MCG/ACT inhaler Commonly known as: VENTOLIN HFA Inhale 2 puffs into the lungs every 6 (six) hours as needed for wheezing or shortness of breath.   naloxone 4 MG/0.1ML Liqd nasal spray kit Commonly known as: NARCAN One spray for overdose   Qvar RediHaler 40 MCG/ACT inhaler Generic drug: beclomethasone Inhale 2 puffs into the lungs 2 (two) times daily.        DISCHARGE INSTRUCTIONS:  Stable. DIET:  Regular diet DISCHARGE CONDITION:  Stable ACTIVITY:  Activity as tolerated OXYGEN:  Home Oxygen: No.  Oxygen Delivery: room air DISCHARGE LOCATION:  Home  If you experience worsening of your admission symptoms, develop shortness of breath, life threatening  emergency, suicidal or homicidal thoughts you must seek medical attention immediately by calling 911 or calling your MD immediately  if symptoms less severe.  You Must read complete  instructions/literature along with all the possible adverse reactions/side effects for all the Medicines you take and that have been prescribed to you. Take any new Medicines after you have completely understood and accpet all the possible adverse reactions/side effects.   Please note  You were cared for by a hospitalist during your hospital stay. If you have any questions about your discharge medications or the care you received while you were in the hospital after you are discharged, you can call the unit and asked to speak with the hospitalist on call if the hospitalist that took care of you is not available. Once you are discharged, your primary care physician will handle any further medical issues. Please note that NO REFILLS for any discharge medications will be authorized once you are discharged, as it is imperative that you return to your primary care physician (or establish a relationship with a primary care physician if you do not have one) for your aftercare needs so that they can reassess your need for medications and monitor your lab values.    On the day of Discharge:  VITAL SIGNS:  Blood pressure (!) 99/57, pulse (!) 53, temperature (!) 97.5 F (36.4 C), temperature source Oral, resp. rate 18, height 5' 6"  (1.676 m), weight 74.8 kg, SpO2 95 %, unknown if currently breastfeeding. PHYSICAL EXAMINATION:  GENERAL:  33 y.o.-year-old patient patient lying in the bed with no acute distress.  EYES: Pupils equal, round, reactive to light and accommodation. No scleral icterus. Extraocular muscles intact.  HEENT: Head atraumatic, normocephalic. Oropharynx and nasopharynx clear.  NECK:  Supple, no jugular venous distention. No thyroid enlargement, no tenderness.  LUNGS: Normal breath sounds bilaterally, no wheezing, rales,rhonchi or crepitation. No use of accessory muscles of respiration.  CARDIOVASCULAR: S1, S2 normal. No murmurs, rubs, or gallops.  ABDOMEN: Soft, non-tender, non-distended. Bowel  sounds present. No organomegaly or mass.  EXTREMITIES: No pedal edema, cyanosis, or clubbing.  NEUROLOGIC: Cranial nerves II through XII are intact. Muscle strength 5/5 in all extremities. Sensation intact. Gait not checked.  PSYCHIATRIC: The patient is alert and oriented x 3.  SKIN: No obvious rash, lesion, or ulcer.  DATA REVIEW:   CBC Recent Labs  Lab 06/11/19 0658  WBC 6.4  HGB 11.0*  HCT 34.9*  PLT 182    Chemistries  Recent Labs  Lab 06/11/19 0658  NA 137  K 3.6  CL 105  CO2 28  GLUCOSE 94  BUN 6  CREATININE 0.83  CALCIUM 7.7*     Microbiology Results  Results for orders placed or performed during the hospital encounter of 06/10/19  SARS Coronavirus 2 (CEPHEID - Performed in [facility] hospital lab), Hosp Order     Status: None   Collection Time: 06/10/19 10:53 AM   Specimen: Nasopharyngeal Swab  Result Value Ref Range Status   SARS Coronavirus 2 NEGATIVE NEGATIVE Final    Comment: (NOTE) If result is NEGATIVE SARS-CoV-2 target nucleic acids are NOT DETECTED. The SARS-CoV-2 RNA is generally detectable in upper and lower  respiratory specimens during the acute phase of infection. The lowest  concentration of SARS-CoV-2 viral copies this assay can detect is 250  copies / mL. A negative result does not preclude SARS-CoV-2 infection  and should not be used as the sole basis for treatment or other  patient management decisions.  A negative result may occur with  improper specimen collection / handling, submission of specimen other  than nasopharyngeal swab, presence of viral mutation(s) within the  areas targeted by this assay, and inadequate number of viral copies  (<250 copies / mL). A negative result must be combined with clinical  observations, patient history, and epidemiological information. If result is POSITIVE SARS-CoV-2 target nucleic acids are DETECTED. The SARS-CoV-2 RNA is generally detectable in upper and lower  respiratory specimens dur ing  the acute phase of infection.  Positive  results are indicative of active infection with SARS-CoV-2.  Clinical  correlation with patient history and other diagnostic information is  necessary to determine patient infection status.  Positive results do  not rule out bacterial infection or co-infection with other viruses. If result is PRESUMPTIVE POSTIVE SARS-CoV-2 nucleic acids MAY BE PRESENT.   A presumptive positive result was obtained on the submitted specimen  and confirmed on repeat testing.  While 2019 novel coronavirus  (SARS-CoV-2) nucleic acids may be present in the submitted sample  additional confirmatory testing may be necessary for epidemiological  and / or clinical management purposes  to differentiate between  SARS-CoV-2 and other Sarbecovirus currently known to infect humans.  If clinically indicated additional testing with an alternate test  methodology [phone]) is advised. The SARS-CoV-2 RNA is generally  detectable in upper and lower respiratory sp ecimens during the acute  phase of infection. The expected result is Negative. Fact Sheet for Patients:  StrictlyIdeas.no Fact Sheet for Healthcare Providers: BankingDealers.co.za This test is not yet approved or cleared by the [place] FDA and has been authorized for detection and/or diagnosis of SARS-CoV-2 by FDA under an Emergency Use Authorization (EUA).  This EUA will remain in effect (meaning this test can be used) for the duration of the COVID-19 declaration under Section 564(b)(1) of the Act, 21 U.S.C. section 360bbb-3(b)(1), unless the authorization is terminated or revoked sooner. Performed at [facility], [address] [place], [place] [address]     RADIOLOGY:  No results found.   Management plans discussed with the patient, family and they are in agreement.  CODE STATUS: Full Code   TOTAL TIME TAKING CARE OF THIS PATIENT: 36 minutes.     Chakara Bognar M.D on 06/13/2019 at 1:56 PM  Between 7am to 6pm - Pager - [phone]  After 6pm go to [email or web address] - Proofreader  [facility]  Office  [phone]  CC: Primary care physician; Patient, No Pcp Per   Note: This dictation was prepared with Dragon dictation along with smaller phrase technology. Any transcriptional errors that result from this process are unintentional.

## 2019-06-13 NOTE — Progress Notes (Signed)
Discharge instructions given and went over with patient at bedside. Awaiting transportation home. Madlyn Frankel, RN

## 2019-06-13 NOTE — TOC Transition Note (Signed)
Transition of Care [facility]) - CM/SW Discharge Note   Patient Details  Name: Jullian Previti MRN: [id number] Date of Birth: 26-Apr-1986  Transition of Care [facility]) CM/SW Contact:  Shelbie Hutching, RN Phone Number: 06/13/2019, 2:47 PM   Clinical Narrative:    Patient has been medically cleared for discharge and psychiatry has also cleared patient for discharge, no inpatient psychiatric treatment necessary.  Patient did voice that she may want to follow up outpatient.  List of [employer] given to patient.  Patient is familiar with [facility] and [facility].  Patient will discharge to her grandmother's home, grandmother has stated that she will pick patient up and she has no safety concerns with patient returning home.     Final next level of care: Home/Self Care Barriers to Discharge: Barriers Resolved   Patient Goals and CMS Choice        Discharge Placement                       Discharge Plan and Services                                     Social Determinants of Health (SDOH) Interventions     Readmission Risk Interventions No flowsheet data found.

## 2019-06-13 NOTE — Consult Note (Signed)
[facility] Face-to-Face Psychiatry Consult   Reason for Consult:  Drug overdose Referring Physician:  Hospitalist [name] MR[facility]:  [id number] Principal Diagnosis: <principal problem not specified> Diagnosis:  Active Problems:   Drug overdose, intentional, initial encounter (HCC)   Total Time spent with patient: 20 minutes  Subjective:   Betty Gomez is a 33 y.o. female patient reports that she is feeling good today.  Patient denies any suicidal or homicidal ideations and denies any hallucinations.  Patient reports that the overdose was never intentional and was in no way an attempt to kill herself.  She adamantly denies that she is not wanting to harm herself.  She states that she was having some muscle pain and was taking some muscle relaxers that was given to her by a friend on top of using her Xanax.  The patient reports that she then blacked out and then woke up in the hospital.  The patient states that she is well aware of using psychotropic medications mixed with prescribed medications and the effects that it can have.  She states that this was never an intent to harm herself.  Patient states that she feels that she is safe enough to go home after spending a couple of days in the hospital.  She states that she does want to get some residential treatment and she had went to a residential rehab last year sometime.  Patient gives me verbal consent to contact her father or grandmother, both of which she lives with them  Contacted patient's grandmother at [phone].  Patient's grandmother states that that she knows that the patient has a substance abuse issue and if she stays away from drugs that she would be just fine.  She states that she has no safety concerns with the patient coming home and states that the patient has never made any comments about wanting to harm herself or making any threats of suicide.  Patient's grandmother states that she will be glad to  come and pick her up from the hospital so that she can come home.  HPI: PER H&P 06/10/19: 33 y.o female with pertinent past medical history of bipolar disorder, severe recurrent major depression without psychotic features, polysubstance abuse, anxiety, endometriosis, and asthma presenting to the ED with unresponsiveness. Patient is currently altered therefore history mostly obtained from patient's chart.  Per ED reports patient arrived via EMS from home unresponsive, apparently she was found by her children unresponsive.  She has history of IV drug abuse and was found with syringes nearby.  Per ED reports she was due to be incarcerated on Monday for other unknown felony charges.  When EMS arrived sats were in the 48% which responded to patient being bagged.  Patient was given 2 mg IV Narcan and was able to maintain her airway.  She had an episode of vomiting and possible aspiration at that time. On arrival to the ED, she was afebrile with blood pressure 129/83 mm Hg and pulse rate 89 beats/min.  Respiration 23 sats 93%.  There were no focal neurological deficits; she was sturporous, lethargic and hypoxic requiring multiple stimulation for arousal.  UDS positive for tricyclic, amphetamines, cannabinoids, benzodiazepine, and methadone.  Initial labs showed slightly elevated white count of 11.9 and unremarkable BMP.  X-ray showed no active cardiopulmonary process.  Patient will be admitted under hospitalist service for further management.  .  Patient is seen by me face-to-face have consulted with Dr. Lucianne MussKumar.  Patient adamantly denies any suicidal or homicidal  ideations and denies any hallucinations.  Patient also denies that the overdose was intentional or an attempt to harm herself.  Patient's grandmother was contacted to establish a safety plan and to ensure there are no safety concerns.  Patient's grandmother provided a safety plan as well as confirmed there were no safety concerns.  I have contacted Dr. Enid Baasjie  and notified him of the new plan for the patient to be cleared from psychiatry.  At this time the patient does not meet inpatient psychiatric treatment and is psychiatrically cleared.  I have also contacted social work and they are going to provide the patient with outpatient resources for detox and residential rehabilitation.  Past Psychiatric History: Anxiety, depression, bipolar disorder, substance abuse  Risk to Self:   Risk to Others:   Prior Inpatient Therapy:   Prior Outpatient Therapy:    Past Medical History:  Past Medical History:  Diagnosis Date  . Anxiety   . Asthma   . Depression    ok, came off meds, has someone she sees for this.  . Endometriosis   . Infection    UTI    Past Surgical History:  Procedure Laterality Date  . DIAGNOSTIC LAPAROSCOPY     endometriosis  . TUBAL LIGATION N/A 09/17/2015   Procedure: POST PARTUM TUBAL LIGATION;  Surgeon: Vena AustriaAndreas Staebler, MD;  Location: [facility] ORS;  Service: Gynecology;  Laterality: N/A;  . TUBAL LIGATION Bilateral 09/21/2016   Procedure: POST PARTUM TUBAL LIGATION;  Surgeon: Nadara Mustardobert P Harris, MD;  Location: [facility] ORS;  Service: Gynecology;  Laterality: Bilateral;   Family History:  Family History  Problem Relation Age of Onset  . Hypertension Mother   . Diabetes Mother   . Heart disease Mother [id number]       failure  . Heart failure Mother   . Depression Father   . Heart disease Maternal Grandfather   . Diabetes Paternal Grandmother   . Hypertension Paternal Grandmother   . Cancer Paternal Grandfather        lung  . Hearing loss Neg Hx    Family Psychiatric  History: Denies Social History:  Social History   Substance and Sexual Activity  Alcohol Use No     Social History   Substance and Sexual Activity  Drug Use Yes  . Types: Marijuana, Methamphetamines, Heroin   Comment: heroin    Social History   Socioeconomic History  . Marital status: Married    Spouse name: Not on file  . Number of children: Not on  file  . Years of education: Not on file  . Highest education level: Not on file  Occupational History  . Not on file  Social Needs  . Financial resource strain: Not on file  . Food insecurity    Worry: Not on file    Inability: Not on file  . Transportation needs    Medical: Not on file    Non-medical: Not on file  Tobacco Use  . Smoking status: Heavy Tobacco Smoker    Packs/day: 0.50    Years: 10.00    Pack years: 5.00    Types: Cigarettes  . Smokeless tobacco: Never Used  Substance and Sexual Activity  . Alcohol use: No  . Drug use: Yes    Types: Marijuana, Methamphetamines, Heroin    Comment: heroin  . Sexual activity: Yes  Lifestyle  . Physical activity    Days per week: Not on file    Minutes per session: Not on file  . Stress:  Not on file  Relationships  . Social Musicianconnections    Talks on phone: Not on file    Gets together: Not on file    Attends religious service: Not on file    Active member of club or organization: Not on file    Attends meetings of clubs or organizations: Not on file    Relationship status: Not on file  Other Topics Concern  . Not on file  Social History Narrative  . Not on file   Additional Social History:    Allergies:  No Known Allergies  Labs: No results found for this or any previous visit (from the past 48 hour(s)).  Current Facility-Administered Medications  Medication Dose Route Frequency Provider Last Rate Last Dose  . acetaminophen (TYLENOL) tablet 650 mg  650 mg Oral Q6H PRN Alford HighlandWieting, Richard, MD   650 mg at 06/12/19 [id number]  . albuterol (PROVENTIL) (2.5 MG/3ML) 0.083% nebulizer solution 2.5 mg  2.5 mg Nebulization Q6H Wieting, Richard, MD   2.5 mg at 06/13/19 0740  . budesonide (PULMICORT) nebulizer solution 0.5 mg  0.5 mg Nebulization BID Alford HighlandWieting, Richard, MD   0.5 mg at 06/13/19 0740  . enoxaparin (LOVENOX) injection 40 mg  40 mg Subcutaneous Q24H Jimmye Normanuma, Elizabeth Achieng, NP   40 mg at 06/11/19 1120  . naloxone [facility](NARCAN)  injection 0.4 mg  0.4 mg Intravenous PRN Jimmye Normanuma, Elizabeth Achieng, NP        Musculoskeletal: Strength & Muscle Tone: within normal limits Gait & Station: normal Patient leans: N/A  Psychiatric Specialty Exam: Physical Exam  Nursing note and vitals reviewed. Constitutional: She is oriented to person, place, and time. She appears well-developed and well-nourished.  Respiratory: Effort normal.  Musculoskeletal: Normal range of motion.  Neurological: She is alert and oriented to person, place, and time.  Skin: Skin is warm.    Review of Systems  Constitutional: Negative.   HENT: Negative.   Eyes: Negative.   Respiratory: Negative.   Cardiovascular: Negative.   Gastrointestinal: Negative.   Genitourinary: Negative.   Musculoskeletal: Negative.   Skin: Negative.   Neurological: Negative.   Endo/Heme/Allergies: Negative.   Psychiatric/Behavioral: Positive for substance abuse. The patient is nervous/anxious.     Blood pressure (!) 99/57, pulse (!) 53, temperature (!) 97.5 F (36.4 C), temperature source Oral, resp. rate 18, height 5\' 6"  (1.676 m), weight 74.8 kg, SpO2 95 %, unknown if currently breastfeeding.Body mass index is 26.62 kg/m.  General Appearance: Casual  Eye Contact:  Good  Speech:  Clear and Coherent and Normal Rate  Volume:  Normal  Mood:  Euthymic  Affect:  Congruent  Thought Process:  Coherent and Descriptions of Associations: Intact  Orientation:  Full (Time, Place, and Person)  Thought Content:  WDL  Suicidal Thoughts:  No  Homicidal Thoughts:  No  Memory:  Immediate;   Good Recent;   Good Remote;   Good  Judgement:  Fair  Insight:  Fair  Psychomotor Activity:  Normal  Concentration:  Concentration: Good and Attention Span: Good  Recall:  Good  Fund of Knowledge:  Good  Language:  Good  Akathisia:  No  Handed:  Right  AIMS (if indicated):     Assets:  Communication Skills Desire for Improvement Financial Resources/Insurance Housing Physical  Health Resilience Social Support Transportation  ADL's:  Intact  Cognition:  WNL  Sleep:        Treatment Plan Summary: Follow up with outpatient resources CSW to provide outpatient resources for rehab Recommend education on  cessation of illicit or non-prescribed drugs Grandmother will pick her up and a safety plan has been established  Disposition: No evidence of imminent risk to self or others at present.   Patient does not meet criteria for psychiatric inpatient admission. Supportive therapy provided about ongoing stressors. Discussed crisis plan, support from social network, calling 911, coming to the Emergency Department, and calling Suicide Hotline.  Wilder, FNP 06/13/2019 2:43 PM

## 2019-06-13 NOTE — ED Triage Notes (Signed)
Pt reports she is suicidal.  Pt was discharged from [facility] approx 5 hours ago.  Pt recently overdosed.  Pt denies drug use tonight.  States SI   No HI.  Pt calm and tearful

## 2019-06-14 ENCOUNTER — Emergency Department
Admission: EM | Admit: 2019-06-14 | Discharge: 2019-06-14 | Disposition: A | Discharge status: Other Acute Inpatient Hospital | Payer: Medicaid Other | Attending: Emergency Medicine | Admitting: Emergency Medicine

## 2019-06-14 ENCOUNTER — Other Ambulatory Visit: Payer: Self-pay

## 2019-06-14 ENCOUNTER — Inpatient Hospital Stay
Admission: AD | Admit: 2019-06-14 | Discharge: 2019-06-18 | DRG: 885 | Disposition: A | Discharge status: Home / Self Care | Payer: Medicaid Other | Source: Intra-hospital | Attending: Psychiatry | Admitting: Psychiatry

## 2019-06-14 DIAGNOSIS — F1310 Sedative, hypnotic or anxiolytic abuse, uncomplicated: Secondary | ICD-10-CM | POA: Diagnosis present

## 2019-06-14 DIAGNOSIS — Z1159 Encounter for screening for other viral diseases: Secondary | ICD-10-CM | POA: Diagnosis not present

## 2019-06-14 DIAGNOSIS — Z818 Family history of other mental and behavioral disorders: Secondary | ICD-10-CM | POA: Diagnosis not present

## 2019-06-14 DIAGNOSIS — F1110 Opioid abuse, uncomplicated: Secondary | ICD-10-CM | POA: Diagnosis present

## 2019-06-14 DIAGNOSIS — F1010 Alcohol abuse, uncomplicated: Secondary | ICD-10-CM | POA: Diagnosis present

## 2019-06-14 DIAGNOSIS — F419 Anxiety disorder, unspecified: Secondary | ICD-10-CM | POA: Diagnosis present

## 2019-06-14 DIAGNOSIS — F1910 Other psychoactive substance abuse, uncomplicated: Secondary | ICD-10-CM

## 2019-06-14 DIAGNOSIS — Z716 Tobacco abuse counseling: Secondary | ICD-10-CM

## 2019-06-14 DIAGNOSIS — R45851 Suicidal ideations: Secondary | ICD-10-CM | POA: Diagnosis present

## 2019-06-14 DIAGNOSIS — F17210 Nicotine dependence, cigarettes, uncomplicated: Secondary | ICD-10-CM | POA: Diagnosis present

## 2019-06-14 DIAGNOSIS — G4700 Insomnia, unspecified: Secondary | ICD-10-CM | POA: Diagnosis present

## 2019-06-14 DIAGNOSIS — Z79899 Other long term (current) drug therapy: Secondary | ICD-10-CM | POA: Diagnosis not present

## 2019-06-14 DIAGNOSIS — F1290 Cannabis use, unspecified, uncomplicated: Secondary | ICD-10-CM | POA: Diagnosis present

## 2019-06-14 DIAGNOSIS — Z915 Personal history of self-harm: Secondary | ICD-10-CM

## 2019-06-14 DIAGNOSIS — F329 Major depressive disorder, single episode, unspecified: Secondary | ICD-10-CM

## 2019-06-14 DIAGNOSIS — F32A Depression, unspecified: Secondary | ICD-10-CM

## 2019-06-14 DIAGNOSIS — F322 Major depressive disorder, single episode, severe without psychotic features: Principal | ICD-10-CM | POA: Diagnosis present

## 2019-06-14 LAB — COMPREHENSIVE METABOLIC PANEL
ALT: 93 U/L — ABNORMAL HIGH (ref 0–44)
AST: 66 U/L — ABNORMAL HIGH (ref 15–41)
Albumin: 3.9 g/dL (ref 3.5–5.0)
Alkaline Phosphatase: 93 U/L (ref 38–126)
Anion gap: 12 (ref 5–15)
BUN: 9 mg/dL (ref 6–20)
CO2: 22 mmol/L (ref 22–32)
Calcium: 9.5 mg/dL (ref 8.9–10.3)
Chloride: 101 mmol/L (ref 98–111)
Creatinine, Ser: 0.93 mg/dL (ref 0.44–1.00)
GFR calc Af Amer: >60 mL/min (ref >60)
GFR calc non Af Amer: >60 mL/min (ref >60)
Glucose, Bld: 125 mg/dL — ABNORMAL HIGH (ref 70–99)
Potassium: 3.8 mmol/L (ref 3.5–5.1)
Sodium: 135 mmol/L (ref 135–145)
Total Bilirubin: 0.6 mg/dL (ref 0.3–1.2)
Total Protein: 7.5 g/dL (ref 6.5–8.1)

## 2019-06-14 LAB — URINE DRUG SCREEN, QUALITATIVE (ARMC ONLY)
Amphetamines, Ur Screen: NOT DETECTED
Barbiturates, Ur Screen: NOT DETECTED
Benzodiazepine, Ur Scrn: NOT DETECTED
Cannabinoid 50 Ng, Ur ~~LOC~~: POSITIVE — AB
Cocaine Metabolite,Ur ~~LOC~~: NOT DETECTED
MDMA (Ecstasy)Ur Screen: NOT DETECTED
Methadone Scn, Ur: POSITIVE — AB
Opiate, Ur Screen: NOT DETECTED
Phencyclidine (PCP) Ur S: NOT DETECTED
Tricyclic, Ur Screen: NOT DETECTED

## 2019-06-14 LAB — POCT PREGNANCY, URINE: Preg Test, Ur: NEGATIVE

## 2019-06-14 LAB — CBC
HCT: 41 % (ref 36.0–46.0)
Hemoglobin: 13.4 g/dL (ref 12.0–15.0)
MCH: 29.4 pg (ref 26.0–34.0)
MCHC: 32.7 g/dL (ref 30.0–36.0)
MCV: 89.9 fL (ref 80.0–100.0)
Platelets: 268 10*3/uL (ref 150–400)
RBC: 4.56 MIL/uL (ref 3.87–5.11)
RDW: 12.1 % (ref 11.5–15.5)
WBC: 10.5 10*3/uL (ref 4.0–10.5)
nRBC: 0 % (ref 0.0–0.2)

## 2019-06-14 LAB — SARS CORONAVIRUS 2 BY RT PCR (HOSPITAL ORDER, PERFORMED IN ~~LOC~~ HOSPITAL LAB): SARS Coronavirus 2: NEGATIVE

## 2019-06-14 LAB — ETHANOL: Alcohol, Ethyl (B): <10 mg/dL (ref <10)

## 2019-06-14 MED ORDER — ALBUTEROL SULFATE (2.5 MG/3ML) 0.083% IN NEBU: 2.5000 mg | INHALATION_SOLUTION | Freq: Four times a day (QID) | RESPIRATORY_TRACT | Status: DC | PRN

## 2019-06-14 MED ORDER — FLUOXETINE HCL 20 MG PO CAPS
80.0000 mg | ORAL_CAPSULE | Freq: Every day | ORAL | Status: DC
  Administered 2019-06-15 – 2019-06-18 (×4): 80 mg via ORAL
  Filled 2019-06-14 (×4): qty 4

## 2019-06-14 MED ORDER — FLUOXETINE HCL 10 MG PO CAPS
10.0000 mg | ORAL_CAPSULE | Freq: Every day | ORAL | Status: DC
  Administered 2019-06-14: 10 mg via ORAL
  Filled 2019-06-14: qty 1

## 2019-06-14 MED ORDER — HYDROXYZINE HCL 25 MG PO TABS
50.0000 mg | ORAL_TABLET | Freq: Once | ORAL | Status: AC
  Administered 2019-06-14: 50 mg via ORAL
  Filled 2019-06-14: qty 2

## 2019-06-14 MED ORDER — HYDROXYZINE HCL 25 MG PO TABS
25.0000 mg | ORAL_TABLET | Freq: Four times a day (QID) | ORAL | Status: DC | PRN
  Administered 2019-06-14 – 2019-06-18 (×6): 25 mg via ORAL
  Filled 2019-06-14 (×6): qty 1

## 2019-06-14 MED ORDER — VITAMIN B-1 100 MG PO TABS
100.0000 mg | ORAL_TABLET | Freq: Every day | ORAL | Status: DC
  Administered 2019-06-14 – 2019-06-18 (×5): 100 mg via ORAL
  Filled 2019-06-14 (×5): qty 1

## 2019-06-14 MED ORDER — GABAPENTIN 300 MG PO CAPS
300.0000 mg | ORAL_CAPSULE | Freq: Two times a day (BID) | ORAL | Status: DC
  Administered 2019-06-14 – 2019-06-18 (×8): 300 mg via ORAL
  Filled 2019-06-14 (×8): qty 1

## 2019-06-14 MED ORDER — TRAZODONE HCL 100 MG PO TABS: 100.0000 mg | ORAL_TABLET | Freq: Every day | ORAL | Status: DC

## 2019-06-14 MED ORDER — TRAZODONE HCL 100 MG PO TABS
200.0000 mg | ORAL_TABLET | Freq: Every day | ORAL | Status: DC
  Administered 2019-06-14 – 2019-06-17 (×4): 200 mg via ORAL
  Filled 2019-06-14 (×4): qty 2

## 2019-06-14 MED ORDER — ARIPIPRAZOLE 10 MG PO TABS
10.0000 mg | ORAL_TABLET | Freq: Once | ORAL | Status: AC
  Administered 2019-06-14: 10 mg via ORAL
  Filled 2019-06-14: qty 1

## 2019-06-14 MED ORDER — ACETAMINOPHEN 325 MG PO TABS
650.0000 mg | ORAL_TABLET | Freq: Four times a day (QID) | ORAL | Status: DC | PRN
  Administered 2019-06-16 – 2019-06-17 (×2): 650 mg via ORAL
  Filled 2019-06-14 (×3): qty 2

## 2019-06-14 MED ORDER — MAGNESIUM HYDROXIDE 400 MG/5ML PO SUSP: 30.0000 mL | Freq: Every day | ORAL | Status: DC | PRN

## 2019-06-14 MED ORDER — ALUM & MAG HYDROXIDE-SIMETH 200-200-20 MG/5ML PO SUSP: 30.0000 mL | ORAL | Status: DC | PRN

## 2019-06-14 NOTE — ED Notes (Signed)
Pt. States I overdosed on Sunday night and was released from medical floor today.  Pt. States "I had money in my purse to buy drugs".  Pt. States "I thought they were going to commit me after I was discharged from medical floor".  Pt. Asked, "what street drugs have you been taking?"  Pt. States, "everything".  Pt. States, "I am suicidal".  According to officer, pt. Ran from [employer].  Pt. States, feeling suicidal.

## 2019-06-14 NOTE — H&P (Signed)
Psychiatric Admission Assessment Adult  [name]:  [id number] Date of Evaluation:  06/14/2019 Chief Complaint:  depression Principal Diagnosis: MDD (major depressive disorder), severe (HCC) Diagnosis:  Principal Problem:   MDD (major depressive disorder), severe (HCC) Active Problems:   Benzodiazepine abuse (HCC)   Alcohol abuse   Opioid abuse (HCC)  History of Present Illness: Patient seen and chart reviewed.  This is a patient with a history of substance abuse problems who came to the emergency room stating that she was having suicidal thoughts.  Patient was released from the emergency room just yesterday in the afternoon after being assessed for a overdose.  At that time she was denying suicidal ideation and was agreeing to outpatient follow-up.  She went home to stay with her father and apparently got into a fight with him.  He threatened to call the police on her which she knew would trigger probably having to go back to jail because she is in a drug court diversion program.  Patient chose instead to come to the emergency room.  She says that her mood is sad down and negative.  Suicidal thoughts without specific plan.  Sleep has been erratic.  Poor self-care.  Has been off of her psychiatric medicine for at least a month.  She says she has been using drugs and when asked to be specific will say "all of it".  Starts naming off things like methamphetamine and LSD.  Drug screen positive only for THC and methadone.  Patient denies hallucinations or psychotic symptoms.  Denies any symptoms of mania.  Has apparently been out there running in the streets using drugs pretty heavily ever since she got out of rehab in February. Associated Signs/Symptoms: Depression Symptoms:  depressed mood, anhedonia, insomnia, feelings of worthlessness/guilt, difficulty concentrating, suicidal thoughts with specific plan, (Hypo) Manic Symptoms:  None Anxiety Symptoms:   Excessive Worry, Psychotic Symptoms:  None PTSD Symptoms: Negative Total Time spent with patient: 1 hour  Past Psychiatric History: Patient has a history of prior presentations with similar circumstances.  Has had a drug abuse problem that is been getting worse for years.  Went to rehab earlier in this year and stayed sober for only 2 weeks afterwards and did not do any follow-up.  She says that the combination of Prozac 80 mg and Abilify 10 mg and trazodone 200 at night was the most helpful medicine that she had.  She has a history of some self injury no definite full-blown suicide attempts but also has overdosed in the past it has been unclear how much it was accidental.  Had been diagnosed as bipolar but not clear that she has had any manic symptoms other than when intoxicated.  Is the patient at risk to self? Yes.    Has the patient been a risk to self in the past 6 months? Yes.    Has the patient been a risk to self within the distant past? Yes.    Is the patient a risk to others? No.  Has the patient been a risk to others in the past 6 months? No.  Has the patient been a risk to others within the distant past? No.   Prior Inpatient Therapy:   Prior Outpatient Therapy:    Alcohol Screening: 1. How often do you have a drink containing alcohol?: Monthly or less 2. How many drinks containing alcohol do you have on a typical day when you are drinking?: 1 or 2 3. How often do  you have six or more drinks on one occasion?: Never AUDIT-C Score: 1 4. How often during the last year have you found that you were not able to stop drinking once you had started?: Never 5. How often during the last year have you failed to do what was normally expected from you becasue of drinking?: Never 6. How often during the last year have you needed a first drink in the morning to get yourself going after a heavy drinking session?: Never 7. How often during the last year have you had a feeling of guilt of remorse  after drinking?: Never 8. How often during the last year have you been unable to remember what happened the night before because you had been drinking?: Never 9. Have you or someone else been injured as a result of your drinking?: No 10. Has a relative or friend or a doctor or another health worker been concerned about your drinking or suggested you cut down?: No Alcohol Use Disorder Identification Test Final Score (AUDIT): 1 Alcohol Brief Interventions/Follow-up: AUDIT Score <7 follow-up not indicated Substance Abuse History in the last 12 months:  Yes.   Consequences of Substance Abuse: Medical Consequences:  Worsening of all of her psychiatric problems Previous Psychotropic Medications: Yes  Psychological Evaluations: Yes  Past Medical History:  Past Medical History:  Diagnosis Date  . Anxiety   . Asthma   . Depression    ok, came off meds, has someone she sees for this.  . Endometriosis   . Infection    UTI    Past Surgical History:  Procedure Laterality Date  . DIAGNOSTIC LAPAROSCOPY     endometriosis  . TUBAL LIGATION N/A 09/17/2015   Procedure: POST PARTUM TUBAL LIGATION;  Surgeon: Vena AustriaAndreas Staebler, MD;  Location: [facility] ORS;  Service: Gynecology;  Laterality: N/A;  . TUBAL LIGATION Bilateral 09/21/2016   Procedure: POST PARTUM TUBAL LIGATION;  Surgeon: Nadara Mustardobert P Harris, MD;  Location: [facility] ORS;  Service: Gynecology;  Laterality: Bilateral;   Family History:  Family History  Problem Relation Age of Onset  . Hypertension Mother   . Diabetes Mother   . Heart disease Mother [id number]       failure  . Heart failure Mother   . Depression Father   . Heart disease Maternal Grandfather   . Diabetes Paternal Grandmother   . Hypertension Paternal Grandmother   . Cancer Paternal Grandfather        lung  . Hearing loss Neg Hx    Family Psychiatric  History: Positive for substance abuse and depression Tobacco Screening: Have you used any form of tobacco in the last 30 days?  (Cigarettes, Smokeless Tobacco, Cigars, and/or Pipes): Yes Tobacco use, Select all that apply: 5 or more cigarettes per day Are you interested in Tobacco Cessation Medications?: Yes, will notify MD for an order Counseled patient on smoking cessation including recognizing danger situations, developing coping skills and basic information about quitting provided: Yes Social History:  Social History   Substance and Sexual Activity  Alcohol Use No     Social History   Substance and Sexual Activity  Drug Use Yes  . Types: Marijuana, Methamphetamines, Heroin   Comment: heroin    Additional Social History:                           Allergies:  No Known Allergies Lab Results:  Results for orders placed or performed during the hospital encounter of 06/14/19 (from  the past 48 hour(s))  Comprehensive metabolic panel     Status: Abnormal   Collection Time: 06/13/19 11:51 PM  Result Value Ref Range   Sodium 135 135 - 145 mmol/L   Potassium 3.8 3.5 - 5.1 mmol/L   Chloride 101 98 - 111 mmol/L   CO2 22 22 - 32 mmol/L   Glucose, Bld 125 (H) 70 - 99 mg/dL   BUN 9 6 - 20 mg/dL   Creatinine, Ser 1.61 0.44 - 1.00 mg/dL   Calcium 9.5 8.9 - 09.6 mg/dL   Total Protein 7.5 6.5 - 8.1 g/dL   Albumin 3.9 3.5 - 5.0 g/dL   AST 66 (H) 15 - 41 U/L   ALT 93 (H) 0 - 44 U/L   Alkaline Phosphatase 93 38 - 126 U/L   Total Bilirubin 0.6 0.3 - 1.2 mg/dL   GFR calc non Af Amer >60 >60 mL/min   GFR calc Af Amer >60 >60 mL/min   Anion gap 12 5 - 15    Comment: Performed at [facility], [address] [place], [place] [address]  Ethanol     Status: None   Collection Time: 06/13/19 11:51 PM  Result Value Ref Range   Alcohol, Ethyl (B) <10 <10 mg/dL    Comment: (NOTE) Lowest detectable limit for serum alcohol is 10 mg/dL. For medical purposes only. Performed at [facility], [address] [place], [place] [address]   cbc     Status: None   Collection Time: 06/13/19 11:51  PM  Result Value Ref Range   WBC 10.5 4.0 - 10.5 K/uL   RBC 4.56 3.87 - 5.11 MIL/uL   Hemoglobin 13.4 12.0 - 15.0 g/dL   HCT 14.7 82.9 - 56.2 %   MCV 89.9 80.0 - 100.0 fL   MCH 29.4 26.0 - 34.0 pg   MCHC 32.7 30.0 - 36.0 g/dL   RDW 13.0 86.5 - 78.4 %   Platelets 268 150 - 400 K/uL   nRBC 0.0 0.0 - 0.2 %    Comment: Performed at [facility], [address], [place], [place] [address]  Urine Drug Screen, Qualitative     Status: Abnormal   Collection Time: 06/14/19 12:04 AM  Result Value Ref Range   Tricyclic, Ur Screen NONE DETECTED NONE DETECTED   Amphetamines, Ur Screen NONE DETECTED NONE DETECTED   MDMA (Ecstasy)Ur Screen NONE DETECTED NONE DETECTED   Cocaine Metabolite,Ur [place] NONE DETECTED NONE DETECTED   Opiate, Ur Screen NONE DETECTED NONE DETECTED   Phencyclidine (PCP) Ur S NONE DETECTED NONE DETECTED   Cannabinoid 50 Ng, Ur [place] POSITIVE (A) NONE DETECTED   Barbiturates, Ur Screen NONE DETECTED NONE DETECTED   Benzodiazepine, Ur Scrn NONE DETECTED NONE DETECTED   Methadone Scn, Ur POSITIVE (A) NONE DETECTED    Comment: (NOTE) Tricyclics + metabolites, urine    Cutoff 1000 ng/mL Amphetamines + metabolites, urine  Cutoff 1000 ng/mL MDMA (Ecstasy), urine              Cutoff 500 ng/mL Cocaine Metabolite, urine          Cutoff 300 ng/mL Opiate + metabolites, urine        Cutoff 300 ng/mL Phencyclidine (PCP), urine         Cutoff 25 ng/mL Cannabinoid, urine                 Cutoff 50 ng/mL Barbiturates + metabolites, urine  Cutoff 200 ng/mL Benzodiazepine, urine  Cutoff 200 ng/mL Methadone, urine                   Cutoff 300 ng/mL The urine drug screen provides only a preliminary, unconfirmed analytical test result and should not be used for non-medical purposes. Clinical consideration and professional judgment should be applied to any positive drug screen result due to possible interfering substances. A more specific alternate chemical method must be used  in order to obtain a confirmed analytical result. Gas chromatography / mass spectrometry (GC/MS) is the preferred confirmat ory method. Performed at [facility], [address] [place], [place] [address]   Pregnancy, urine POC     Status: None   Collection Time: 06/14/19 12:08 AM  Result Value Ref Range   Preg Test, Ur NEGATIVE NEGATIVE    Comment:        THE SENSITIVITY OF THIS METHODOLOGY IS >24 mIU/mL   SARS Coronavirus 2 (CEPHEID - Performed in [facility] hospital lab), Hosp Order     Status: None   Collection Time: 06/14/19  2:12 AM   Specimen: Nasopharyngeal Swab  Result Value Ref Range   SARS Coronavirus 2 NEGATIVE NEGATIVE    Comment: (NOTE) If result is NEGATIVE SARS-CoV-2 target nucleic acids are NOT DETECTED. The SARS-CoV-2 RNA is generally detectable in upper and lower  respiratory specimens during the acute phase of infection. The lowest  concentration of SARS-CoV-2 viral copies this assay can detect is 250  copies / mL. A negative result does not preclude SARS-CoV-2 infection  and should not be used as the sole basis for treatment or other  patient management decisions.  A negative result may occur with  improper specimen collection / handling, submission of specimen other  than nasopharyngeal swab, presence of viral mutation(s) within the  areas targeted by this assay, and inadequate number of viral copies  (<250 copies / mL). A negative result must be combined with clinical  observations, patient history, and epidemiological information. If result is POSITIVE SARS-CoV-2 target nucleic acids are DETECTED. The SARS-CoV-2 RNA is generally detectable in upper and lower  respiratory specimens dur ing the acute phase of infection.  Positive  results are indicative of active infection with SARS-CoV-2.  Clinical  correlation with patient history and other diagnostic information is  necessary to determine patient infection status.  Positive results do   not rule out bacterial infection or co-infection with other viruses. If result is PRESUMPTIVE POSTIVE SARS-CoV-2 nucleic acids MAY BE PRESENT.   A presumptive positive result was obtained on the submitted specimen  and confirmed on repeat testing.  While 2019 novel coronavirus  (SARS-CoV-2) nucleic acids may be present in the submitted sample  additional confirmatory testing may be necessary for epidemiological  and / or clinical management purposes  to differentiate between  SARS-CoV-2 and other Sarbecovirus currently known to infect humans.  If clinically indicated additional testing with an alternate test  methodology [phone]) is advised. The SARS-CoV-2 RNA is generally  detectable in upper and lower respiratory sp ecimens during the acute  phase of infection. The expected result is Negative. Fact Sheet for Patients:  StrictlyIdeas.no Fact Sheet for Healthcare Providers: BankingDealers.co.za This test is not yet approved or cleared by the [place] FDA and has been authorized for detection and/or diagnosis of SARS-CoV-2 by FDA under an Emergency Use Authorization (EUA).  This EUA will remain in effect (meaning this test can be used) for the duration of the COVID-19 declaration under Section 564(b)(1) of the  Act, 21 U.S.C. section 360bbb-3(b)(1), unless the authorization is terminated or revoked sooner. Performed at [facility], [address] [place], [place] [address]     Blood Alcohol level:  Lab Results  Component Value Date   [facility] Ltd[facility] <10 06/13/2019   ETH <10 06/10/2019    Metabolic Disorder Labs:  Lab Results  Component Value Date   HGBA1C 5.0 11/16/2018   MPG 96.8 11/16/2018   No results found for: PROLACTIN Lab Results  Component Value Date   CHOL 108 11/16/2018   TRIG 54 11/16/2018   HDL 52 11/16/2018   CHOLHDL 2.1 11/16/2018   VLDL 11 11/16/2018   LDLCALC 45 11/16/2018    Current  Medications: Current Facility-Administered Medications  Medication Dose Route Frequency Provider Last Rate Last Dose  . acetaminophen (TYLENOL) tablet 650 mg  650 mg Oral Q6H PRN Thomspon, Adela LankJacqueline, NP      . albuterol (PROVENTIL) (2.5 MG/3ML) 0.083% nebulizer solution 2.5 mg  2.5 mg Inhalation Q6H PRN Catalina Gravelhomspon, Jacqueline, NP      . alum & mag hydroxide-simeth (MAALOX/MYLANTA) 200-200-20 MG/5ML suspension 30 mL  30 mL Oral Q4H PRN Catalina Gravelhomspon, Jacqueline, NP      . Melene Muller[START ON 06/15/2019] FLUoxetine (PROZAC) capsule 80 mg  80 mg Oral Daily Shandrika Ambers T, MD      . gabapentin (NEURONTIN) capsule 300 mg  300 mg Oral BID Rosaland Shiffman T, MD      . hydrOXYzine (ATARAX/VISTARIL) tablet 25 mg  25 mg Oral Q6H PRN Catalina Gravelhomspon, Jacqueline, NP   25 mg at 06/14/19 1700  . magnesium hydroxide (MILK OF MAGNESIA) suspension 30 mL  30 mL Oral Daily PRN Catalina Gravelhomspon, Jacqueline, NP      . thiamine (VITAMIN B-1) tablet 100 mg  100 mg Oral Daily Thomspon, Adela LankJacqueline, NP   100 mg at 06/14/19 1702  . traZODone (DESYREL) tablet 200 mg  200 mg Oral QHS Jianni Batten T, MD       PTA Medications: Medications Prior to Admission  Medication Sig Dispense Refill Last Dose  . albuterol (VENTOLIN HFA) 108 (90 Base) MCG/ACT inhaler Inhale 2 puffs into the lungs every 6 (six) hours as needed for wheezing or shortness of breath. 18 g 0   . beclomethasone (QVAR REDIHALER) 40 MCG/ACT inhaler Inhale 2 puffs into the lungs 2 (two) times daily. 10.6 g 0   . naloxone (NARCAN) nasal spray 4 mg/0.1 mL One spray for overdose 1 each 0     Musculoskeletal: Strength & Muscle Tone: within normal limits Gait & Station: normal Patient leans: N/A  Psychiatric Specialty Exam: Physical Exam  Nursing note and vitals reviewed. Constitutional: She appears well-developed and well-nourished.  HENT:  Head: Normocephalic and atraumatic.  Eyes: Pupils are equal, round, and reactive to light. Conjunctivae are normal.  Neck: Normal range of motion.   Cardiovascular: Regular rhythm and normal heart sounds.  Respiratory: Effort normal. No respiratory distress.  GI: Soft.  Musculoskeletal: Normal range of motion.  Neurological: She is alert.  Skin: Skin is warm and dry.  Psychiatric: Her speech is delayed. She is slowed. She is not agitated. Cognition and memory are normal. She expresses impulsivity. She exhibits a depressed mood. She expresses suicidal ideation. She expresses no suicidal plans.    Review of Systems  Constitutional: Negative.   HENT: Negative.   Eyes: Negative.   Respiratory: Negative.   Cardiovascular: Negative.   Gastrointestinal: Negative.   Musculoskeletal: Negative.   Skin: Negative.   Neurological: Negative.   Psychiatric/Behavioral: Positive  for depression, substance abuse and suicidal ideas. Negative for hallucinations. The patient is not nervous/anxious.     Blood pressure 102/70, pulse 82, temperature 97.7 F (36.5 C), temperature source Oral, resp. rate 16, height  (1.676 m), weight 73.9 kg, last menstrual period 06/13/2019, SpO2 98 %, unknown if currently breastfeeding.Body mass index is 26.31 kg/m.  General Appearance: Disheveled  Eye Contact:  Fair  Speech:  Clear and Coherent  Volume:  Decreased  Mood:  Depressed  Affect:  Depressed  Thought Process:  Coherent  Orientation:  Full (Time, Place, and Person)  Thought Content:  Logical  Suicidal Thoughts:  Yes.  without intent/plan  Homicidal Thoughts:  No  Memory:  Immediate;   Fair Recent;   Fair Remote;   Fair  Judgement:  Fair  Insight:  Fair  Psychomotor Activity:  Normal  Concentration:  Concentration: Fair  Recall:  [employer] of Knowledge:  Fair  Language:  Fair  Akathisia:  No  Handed:  Right  AIMS (if indicated):     Assets:  Desire for Improvement  ADL's:  Impaired  Cognition:  WNL  Sleep:       Treatment Plan Summary: Daily contact with patient to assess and evaluate symptoms and progress in treatment, Medication  management and Plan Patient does not require any detox.  I have restarted her psychiatric medicine as she quoted them to me in the past.  Patient tells me that she had a court hearing today.  I have a suspicion that a lot of this admission is trying to avoid going to court but of course she also probably is feeling sad and upset about that.  Patient will be engaged in individual and group therapy.  Reevaluate suicidality and symptoms prior to arranging for discharge plan.  Observation Level/Precautions:  15 minute checks  Laboratory:  UDS  Psychotherapy:    Medications:    Consultations:    Discharge Concerns:    Estimated LOS:  Other:     Physician Treatment Plan for Primary Diagnosis: MDD (major depressive disorder), severe (HCC) Long Term Goal(s): Improvement in symptoms so as ready for discharge  Short Term Goals: Ability to disclose and discuss suicidal ideas  Physician Treatment Plan for Secondary Diagnosis: Principal Problem:   MDD (major depressive disorder), severe (HCC) Active Problems:   Benzodiazepine abuse (HCC)   Alcohol abuse   Opioid abuse (HCC)  Long Term Goal(s): Improvement in symptoms so as ready for discharge  Short Term Goals: Compliance with prescribed medications will improve and Ability to identify triggers associated with substance abuse/mental health issues will improve  I certify that inpatient services furnished can reasonably be expected to improve the patient's condition.    Mordecai Rasmussen, MD 7/9/20205:32 PM

## 2019-06-14 NOTE — ED Provider Notes (Signed)
[facility] Emergency Department Provider Note  Time seen: 12:52 AM  I have reviewed the triage vital signs and the nursing notes.   HISTORY  Chief Complaint Suicidal   HPI Betty Gomez is a 33 y.o. female the past medical history of anxiety, substance abuse, presents to the emergency department with depression.  According to the patient she was discharged from the hospital earlier today after an admission for an overdose.  Patient states she is only been home approximately 5 hours however DSS is involved and was at her house, police were called out to the house for disturbance.  Police state the patient had a probation violation and they were going to arrest the patient however she began complaining of depression and suicidal thoughts so they brought her to the emergency department for evaluation.  Here the patient is agreeable to be evaluated, and is here voluntarily.  States she was going to do more drugs today because she has a drug problem.  States she uses all kinds of drugs but denies alcohol use.  Patient has no medical complaints besides diarrhea on review of systems.   Past Medical History:  Diagnosis Date  . Anxiety   . Asthma   . Depression    ok, came off meds, has someone she sees for this.  . Endometriosis   . Infection    UTI    Patient Active Problem List   Diagnosis Date Noted  . Drug overdose, intentional, initial encounter ([place]) 06/10/2019  . Severe recurrent major depression without psychotic features ([place]) 11/17/2018  . Bipolar disorder, unspecified ([place]) 11/17/2018  . Substance induced mood disorder ([place]) 11/17/2018  . Cannabis abuse 11/17/2018  . Methamphetamine abuse ([place]) 11/17/2018  . Opioid abuse ([place]) 11/17/2018  . Tobacco abuse 11/17/2018  . Polysubstance abuse ([place]) 11/17/2018  . Suicidal ideation 11/16/2018  . Depression 11/16/2018  . Alcohol abuse 11/16/2018  . Adjustment disorder with mixed disturbance of emotions  and conduct 09/21/2017  . Benzodiazepine abuse ([place]) 09/21/2017  . Normal labor 09/20/2016  . Labor and delivery, indication for care 09/08/2016  . Decreased fetal movement 07/24/2016  . Pregnancy 09/15/2015  . Insufficient prenatal care 09/15/2015  . Indication for care in labor or delivery 09/10/2015  . Braxton Hicks contractions 09/08/2015  . Candida albicans infection 08/16/2015  . Abnormal antenatal AFP screen 05/24/2015  . Irregular contractions 05/24/2015    Past Surgical History:  Procedure Laterality Date  . DIAGNOSTIC LAPAROSCOPY     endometriosis  . TUBAL LIGATION N/A 09/17/2015   Procedure: POST PARTUM TUBAL LIGATION;  Surgeon: Malachy Mood, MD;  Location: [facility] ORS;  Service: Gynecology;  Laterality: N/A;  . TUBAL LIGATION Bilateral 09/21/2016   Procedure: POST PARTUM TUBAL LIGATION;  Surgeon: Gae Dry, MD;  Location: [facility] ORS;  Service: Gynecology;  Laterality: Bilateral;    Prior to Admission medications   Medication Sig Start Date End Date Taking? Authorizing Provider  albuterol (VENTOLIN HFA) 108 (90 Base) MCG/ACT inhaler Inhale 2 puffs into the lungs every 6 (six) hours as needed for wheezing or shortness of breath. 06/12/19   Loletha Grayer, MD  beclomethasone (QVAR REDIHALER) 40 MCG/ACT inhaler Inhale 2 puffs into the lungs 2 (two) times daily. 06/12/19   Loletha Grayer, MD  naloxone [facility]) nasal spray 4 mg/0.1 mL One spray for overdose 06/12/19   Loletha Grayer, MD    No Known Allergies  Family History  Problem Relation Age of Onset  . Hypertension Mother   .  Diabetes Mother   . Heart disease Mother 7034       failure  . Heart failure Mother   . Depression Father   . Heart disease Maternal Grandfather   . Diabetes Paternal Grandmother   . Hypertension Paternal Grandmother   . Cancer Paternal Grandfather        lung  . Hearing loss Neg Hx     Social History Social History   Tobacco Use  . Smoking status: Heavy Tobacco Smoker     Packs/day: 0.50    Years: 10.00    Pack years: 5.00    Types: Cigarettes  . Smokeless tobacco: Never Used  Substance Use Topics  . Alcohol use: No  . Drug use: Yes    Types: Marijuana, Methamphetamines, Heroin    Comment: heroin    Review of Systems Constitutional: Negative for fever. Cardiovascular: Negative for chest pain. Respiratory: Negative for shortness of breath. Gastrointestinal: Negative for abdominal pain, vomiting.  Positive for diarrhea today. Genitourinary: Negative for urinary compaints Musculoskeletal: Negative for musculoskeletal complaints Skin: Negative for skin complaints  Neurological: Negative for headache All other ROS negative  ____________________________________________   PHYSICAL EXAM:  VITAL SIGNS: ED Triage Vitals  Enc Vitals Group     BP 06/13/19 2346 102/62     Pulse Rate 06/13/19 2346 (!) 106     Resp 06/13/19 2346 16     Temp 06/13/19 2346 98.1 F (36.7 C)     Temp Source 06/13/19 2346 Oral     SpO2 06/13/19 2346 97 %     Weight 06/13/19 2347 164 lb (74.4 kg)     Height 06/13/19 2347 5\' 6"  (1.676 m)     Head Circumference --      Peak Flow --      Pain Score 06/13/19 2357 0     Pain Loc --      Pain Edu? --      Excl. in GC? --    Constitutional: Alert and oriented. Well appearing and in no distress. Eyes: Normal exam ENT      Head: Normocephalic and atraumatic.      Mouth/Throat: Mucous membranes are moist. Cardiovascular: Normal rate, regular rhythm. Respiratory: Normal respiratory effort without tachypnea nor retractions. Breath sounds are clear  Gastrointestinal: Soft and nontender. No distention.  Musculoskeletal: Nontender with normal range of motion in all extremities. Neurologic:  Normal speech and language. No gross focal neurologic deficits Skin:  Skin is warm, dry and intact.  Psychiatric: Admits depression but denies any active plan to hurt herself.  ____________________________________________   INITIAL  IMPRESSION / ASSESSMENT AND PLAN / ED COURSE  Pertinent labs & imaging results that were available during my care of the patient were reviewed by me and considered in my medical decision making (see chart for details).   Patient presents to the emergency department for depression and substance abuse.  Patient was discharged in the hospital earlier today.  Patient is here voluntarily at this time looking for help.  Denies any active plan to hurt herself.  States she was concerned she would do more drugs if she did not come get help.  We will have psychiatry TTS evaluate the patient.  Patient's medical work-up tonight is largely nonrevealing, besides cannabinoid and methamphetamine positive urine toxicology.  Sanda Kleinshley Brooke Kowalski was evaluated in Emergency Department on 06/14/2019 for the symptoms described in the history of present illness. She was evaluated in the context of the global COVID-19 pandemic, which necessitated consideration that  the patient might be at risk for infection with the SARS-CoV-2 virus that causes COVID-19. Institutional protocols and algorithms that pertain to the evaluation of patients at risk for COVID-19 are in a state of rapid change based on information released by regulatory bodies including the CDC and federal and state organizations. These policies and algorithms were followed during the patient's care in the ED.  ____________________________________________   FINAL CLINICAL IMPRESSION(S) / ED DIAGNOSES  Substance abuse Depression   Minna AntisPaduchowski, Michaila Kenney, MD 06/14/19 [phone]

## 2019-06-14 NOTE — Tx Team (Signed)
Initial Treatment Plan 06/14/2019 4:02 PM Kathie Dike Gouger LFY:[id number]    PATIENT STRESSORS: Legal issue Medication change or noncompliance Substance abuse   PATIENT STRENGTHS: Ability for insight Average or above average intelligence Communication skills Supportive family/friends   PATIENT IDENTIFIED PROBLEMS: Suicidal / Overdose 06/14/2019  Marriage Failure  06/14/2019  Substance Abuse  06/14/2019  Jail / Court Charges  06/14/2019               DISCHARGE CRITERIA:  Ability to meet basic life and health needs Improved stabilization in mood, thinking, and/or behavior  PRELIMINARY DISCHARGE PLAN: Outpatient therapy Return to previous living arrangement  PATIENT/FAMILY INVOLVEMENT: This treatment plan has been presented to and reviewed with the patient, Betty Gomez, and/or family member, .  The patient and family have been given the opportunity to ask questions and make suggestions.  Leodis Liverpool, RN 06/14/2019, 4:02 PM

## 2019-06-14 NOTE — ED Notes (Signed)
Room 304

## 2019-06-14 NOTE — Progress Notes (Signed)
Admission Note:  D: Pt appeared depressed  With  a flat affect.  Pt  Denies SI / AVH at this time.  34 year old white female in under the service of DR. Clapacs . Patient present  To emergence  By law enforcement  [employer] . Patient had been discharge from hospital , returned  Stated she was a danger to her self . Patient has previous legal charges . Other stressors  Include Marriage failure . Jail and [employer] , Visual merchandiser , Overdose  And Substance Abuse. Non compliant  With medication   Pt is redirectable and cooperative with assessment.      Past Medical History: Anxiety,  Asthma , Depression and Endometrosis    A: Pt admitted to unit per protocol, skin assessment and search done and no contraband found  With Gigi RN tattoos noted on body  No breaks in skin .  Pt  educated on therapeutic milieu rules. Pt was introduced to milieu by nursing staff.    R: Pt was receptive to education about the milieu .  15 min safety checks started. Probation officer offered support

## 2019-06-14 NOTE — Plan of Care (Signed)
Instructed patient on [place] , unit programing , able to verbalize understanding  Problem: Education: Goal: Knowledge of [place] Education information/materials will improve Outcome: Progressing

## 2019-06-14 NOTE — Consult Note (Signed)
[facility]   Reason for Consult: Suicidal ideation Referring Physician: Dr. Lenard LancePaduchowski  Patient Identification[facility] MR[facility]:  [id number] Principal Diagnosis: <principal problem not specified> Diagnosis:  Active Problems:   Depression with suicidal ideation   Total Time spent with patient: 1 hour  Subjective:   Sanda Kleinshley Brooke Radovich is a 33 y.o. female patient [facility] via law enforcement under voluntarily commitment status. As discussed by the patient she was discharged from the hospital earlier today after an admission for an intentional overdose.  She states "I need help."  She discussed "I am not saying this to avoid been arrested, but I truly need help."  During the patient assessment she is extremely tearful.  She discussed wanting to do well for herself and her children.  She states she recently started using drugs about 2 years ago after the break-up of her marriage.  The patient states she is to attend drug court but admits she might have to spend some time in jail.  She states once out of jail she plans on going into a treatment facility for her substance abuse. The patient was seen face-to-face by this provider; chart reviewed and consulted with Dr. Lenard LancePaduchowski on 06/14/2019 due to the care of the patient. It was discussed with the provider that the patient does meet criteria to be admitted to the inpatient unit. Due to her currently voicing suicidal ideation with a plan to overdose on drugs as she did a couple days ago. On evaluation the patient is alert and oriented x4, very emotional but cooperative, and mood-congruent with affect. The patient does not appear to be responding to internal or external stimuli. Neither is the patient presenting with any delusional thinking. The patient denies auditory or visual hallucinations. The patient does admit to suicidal and self-harm ideations, but denies homicidal ideations. The patient is not  presenting with any psychotic or paranoid behaviors. During an encounter with the patient, she was able to answer questions appropriately.   Plan: The patient is a safety risk to self and does require psychiatric inpatient admission for stabilization and treatment. The patient will be restarted on:  Abilify 10 mg daily Prozac 10 mg daily Trazodone 100 mg at bedtime Thiamine 100 mg daily  HPI:  Per Dr. Lenard LancePaduchowski; Sanda KleinAshley Brooke Fetty is a 33 y.o. female the past medical history of anxiety, substance abuse, presents to the emergency department with depression.  According to the patient she was discharged from the hospital earlier today after an admission for an overdose.  Patient states she is only been home approximately 5 hours however DSS is involved and was at her house, police were called out to the house for disturbance.  Police state the patient had a probation violation and they were going to arrest the patient however she began complaining of depression and suicidal thoughts so they brought her to the emergency department for evaluation.  Here the patient is agreeable to be evaluated, and is here voluntarily.  States she was going to do more drugs today because she has a drug problem.  States she uses all kinds of drugs but denies alcohol use.  Patient has no medical complaints besides diarrhea on review of systems.  Past Psychiatric History:  Anxiety Depression Drug overdose, intentional, initial encounter (HCC) Severe recurrent major depression without psychotic features (HCC) Bipolar disorder, unspecified (HCC) Poly-substance abuse (HCC) Suicidal ideation Alcohol abuse Adjustment disorder with mixed disturbance of emotion and conduct   Risk to Self:  Yes Risk to Others:  No Prior Inpatient Therapy:  Yes Prior Outpatient Therapy:  Yes  Past Medical History:  Past Medical History:  Diagnosis Date  . Anxiety   . Asthma   . Depression    ok, came off meds, has someone she  sees for this.  . Endometriosis   . Infection    UTI    Past Surgical History:  Procedure Laterality Date  . DIAGNOSTIC LAPAROSCOPY     endometriosis  . TUBAL LIGATION N/A 09/17/2015   Procedure: POST PARTUM TUBAL LIGATION;  Surgeon: Vena AustriaAndreas Staebler, MD;  Location: [facility] ORS;  Service: Gynecology;  Laterality: N/A;  . TUBAL LIGATION Bilateral 09/21/2016   Procedure: POST PARTUM TUBAL LIGATION;  Surgeon: Nadara Mustardobert P Harris, MD;  Location: [facility] ORS;  Service: Gynecology;  Laterality: Bilateral;   Family History:  Family History  Problem Relation Age of Onset  . Hypertension Mother   . Diabetes Mother   . Heart disease Mother [id number]       failure  . Heart failure Mother   . Depression Father   . Heart disease Maternal Grandfather   . Diabetes Paternal Grandmother   . Hypertension Paternal Grandmother   . Cancer Paternal Grandfather        lung  . Hearing loss Neg Hx    Family Psychiatric  History:  Suicidal Social History:  Social History   Substance and Sexual Activity  Alcohol Use No     Social History   Substance and Sexual Activity  Drug Use Yes  . Types: Marijuana, Methamphetamines, Heroin   Comment: heroin    Social History   Socioeconomic History  . Marital status: Married    Spouse name: Not on file  . Number of children: Not on file  . Years of education: Not on file  . Highest education level: Not on file  Occupational History  . Not on file  Social Needs  . Financial resource strain: Not on file  . Food insecurity    Worry: Not on file    Inability: Not on file  . Transportation needs    Medical: Not on file    Non-medical: Not on file  Tobacco Use  . Smoking status: Heavy Tobacco Smoker    Packs/day: 0.50    Years: 10.00    Pack years: 5.00    Types: Cigarettes  . Smokeless tobacco: Never Used  Substance and Sexual Activity  . Alcohol use: No  . Drug use: Yes    Types: Marijuana, Methamphetamines, Heroin    Comment: heroin  . Sexual  activity: Yes  Lifestyle  . Physical activity    Days per week: Not on file    Minutes per session: Not on file  . Stress: Not on file  Relationships  . Social Musicianconnections    Talks on phone: Not on file    Gets together: Not on file    Attends religious service: Not on file    Active member of club or organization: Not on file    Attends meetings of clubs or organizations: Not on file    Relationship status: Not on file  Other Topics Concern  . Not on file  Social History Narrative  . Not on file   Additional Social History:    Allergies:  No Known Allergies  Labs:  Results for orders placed or performed during the hospital encounter of 06/14/19 (from the past 48 hour(s))  Comprehensive metabolic panel     Status: Abnormal  Collection Time: 06/13/19 11:51 PM  Result Value Ref Range   Sodium 135 135 - 145 mmol/L   Potassium 3.8 3.5 - 5.1 mmol/L   Chloride 101 98 - 111 mmol/L   CO2 22 22 - 32 mmol/L   Glucose, Bld 125 (H) 70 - 99 mg/dL   BUN 9 6 - 20 mg/dL   Creatinine, Ser 0.93 0.44 - 1.00 mg/dL   Calcium 9.5 8.9 - 10.3 mg/dL   Total Protein 7.5 6.5 - 8.1 g/dL   Albumin 3.9 3.5 - 5.0 g/dL   AST 66 (H) 15 - 41 U/L   ALT 93 (H) 0 - 44 U/L   Alkaline Phosphatase 93 38 - 126 U/L   Total Bilirubin 0.6 0.3 - 1.2 mg/dL   GFR calc non Af Amer >60 >60 mL/min   GFR calc Af Amer >60 >60 mL/min   Anion gap 12 5 - 15    Comment: Performed at [facility], [address] [place], [place] [address]  Ethanol     Status: None   Collection Time: 06/13/19 11:51 PM  Result Value Ref Range   Alcohol, Ethyl (B) <10 <10 mg/dL    Comment: (NOTE) Lowest detectable limit for serum alcohol is 10 mg/dL. For medical purposes only. Performed at [facility], [address] [place], [place] [address]   cbc     Status: None   Collection Time: 06/13/19 11:51 PM  Result Value Ref Range   WBC 10.5 4.0 - 10.5 K/uL   RBC 4.56 3.87 - 5.11 MIL/uL   Hemoglobin 13.4 12.0 -  15.0 g/dL   HCT 41.0 36.0 - 46.0 %   MCV 89.9 80.0 - 100.0 fL   MCH 29.4 26.0 - 34.0 pg   MCHC 32.7 30.0 - 36.0 g/dL   RDW 12.1 11.5 - 15.5 %   Platelets 268 150 - 400 K/uL   nRBC 0.0 0.0 - 0.2 %    Comment: Performed at [facility], [address], [place], [place] [address]  Urine Drug Screen, Qualitative     Status: Abnormal   Collection Time: 06/14/19 12:04 AM  Result Value Ref Range   Tricyclic, Ur Screen NONE DETECTED NONE DETECTED   Amphetamines, Ur Screen NONE DETECTED NONE DETECTED   MDMA (Ecstasy)Ur Screen NONE DETECTED NONE DETECTED   Cocaine Metabolite,Ur [place] NONE DETECTED NONE DETECTED   Opiate, Ur Screen NONE DETECTED NONE DETECTED   Phencyclidine (PCP) Ur S NONE DETECTED NONE DETECTED   Cannabinoid 50 Ng, Ur [place] POSITIVE (A) NONE DETECTED   Barbiturates, Ur Screen NONE DETECTED NONE DETECTED   Benzodiazepine, Ur Scrn NONE DETECTED NONE DETECTED   Methadone Scn, Ur POSITIVE (A) NONE DETECTED    Comment: (NOTE) Tricyclics + metabolites, urine    Cutoff 1000 ng/mL Amphetamines + metabolites, urine  Cutoff 1000 ng/mL MDMA (Ecstasy), urine              Cutoff 500 ng/mL Cocaine Metabolite, urine          Cutoff 300 ng/mL Opiate + metabolites, urine        Cutoff 300 ng/mL Phencyclidine (PCP), urine         Cutoff 25 ng/mL Cannabinoid, urine                 Cutoff 50 ng/mL Barbiturates + metabolites, urine  Cutoff 200 ng/mL Benzodiazepine, urine              Cutoff 200 ng/mL Methadone, urine  Cutoff 300 ng/mL The urine drug screen provides only a preliminary, unconfirmed analytical test result and should not be used for non-medical purposes. Clinical consideration and professional judgment should be applied to any positive drug screen result due to possible interfering substances. A more specific alternate chemical method must be used in order to obtain a confirmed analytical result. Gas chromatography / mass spectrometry (GC/MS) is the  preferred confirmat ory method. Performed at [facility], [address] [place], [place] [address]   Pregnancy, urine POC     Status: None   Collection Time: 06/14/19 12:08 AM  Result Value Ref Range   Preg Test, Ur NEGATIVE NEGATIVE    Comment:        THE SENSITIVITY OF THIS METHODOLOGY IS >24 mIU/mL   SARS Coronavirus 2 (CEPHEID - Performed in [facility] hospital lab), Hosp Order     Status: None   Collection Time: 06/14/19  2:12 AM   Specimen: Nasopharyngeal Swab  Result Value Ref Range   SARS Coronavirus 2 NEGATIVE NEGATIVE    Comment: (NOTE) If result is NEGATIVE SARS-CoV-2 target nucleic acids are NOT DETECTED. The SARS-CoV-2 RNA is generally detectable in upper and lower  respiratory specimens during the acute phase of infection. The lowest  concentration of SARS-CoV-2 viral copies this assay can detect is 250  copies / mL. A negative result does not preclude SARS-CoV-2 infection  and should not be used as the sole basis for treatment or other  patient management decisions.  A negative result may occur with  improper specimen collection / handling, submission of specimen other  than nasopharyngeal swab, presence of viral mutation(s) within the  areas targeted by this assay, and inadequate number of viral copies  (<250 copies / mL). A negative result must be combined with clinical  observations, patient history, and epidemiological information. If result is POSITIVE SARS-CoV-2 target nucleic acids are DETECTED. The SARS-CoV-2 RNA is generally detectable in upper and lower  respiratory specimens dur ing the acute phase of infection.  Positive  results are indicative of active infection with SARS-CoV-2.  Clinical  correlation with patient history and other diagnostic information is  necessary to determine patient infection status.  Positive results do  not rule out bacterial infection or co-infection with other viruses. If result is PRESUMPTIVE  POSTIVE SARS-CoV-2 nucleic acids MAY BE PRESENT.   A presumptive positive result was obtained on the submitted specimen  and confirmed on repeat testing.  While 2019 novel coronavirus  (SARS-CoV-2) nucleic acids may be present in the submitted sample  additional confirmatory testing may be necessary for epidemiological  and / or clinical management purposes  to differentiate between  SARS-CoV-2 and other Sarbecovirus currently known to infect humans.  If clinically indicated additional testing with an alternate test  methodology [phone]) is advised. The SARS-CoV-2 RNA is generally  detectable in upper and lower respiratory sp ecimens during the acute  phase of infection. The expected result is Negative. Fact Sheet for Patients:  BoilerBrush.com.cy Fact Sheet for Healthcare Providers: https://pope.com/ This test is not yet approved or cleared by the [place] FDA and has been authorized for detection and/or diagnosis of SARS-CoV-2 by FDA under an Emergency Use Authorization (EUA).  This EUA will remain in effect (meaning this test can be used) for the duration of the COVID-19 declaration under Section 564(b)(1) of the Act, 21 U.S.C. section 360bbb-3(b)(1), unless the authorization is terminated or revoked sooner. Performed at [facility], [address] [place],  [place] [id number]     No current facility-administered medications for this encounter.    Current Outpatient Medications  Medication Sig Dispense Refill  . albuterol (VENTOLIN HFA) 108 (90 Base) MCG/ACT inhaler Inhale 2 puffs into the lungs every 6 (six) hours as needed for wheezing or shortness of breath. 18 g 0  . beclomethasone (QVAR REDIHALER) 40 MCG/ACT inhaler Inhale 2 puffs into the lungs 2 (two) times daily. 10.6 g 0  . naloxone (NARCAN) nasal spray 4 mg/0.1 mL One spray for overdose 1 each 0    Musculoskeletal: Strength & Muscle Tone: within normal  limits Gait & Station: normal Patient leans: N/A  Psychiatric Specialty Exam: Physical Exam  Nursing note and vitals reviewed. Constitutional: She is oriented to person, place, and time. She appears well-developed and well-nourished.  HENT:  Head: Normocephalic.  Eyes: Pupils are equal, round, and reactive to light. Conjunctivae are normal.  Neck: Normal range of motion. Neck supple.  Cardiovascular: Normal rate and regular rhythm.  Respiratory: Effort normal and breath sounds normal.  Musculoskeletal: Normal range of motion.  Neurological: She is alert and oriented to person, place, and time.  Skin: Skin is warm and dry.  Psychiatric: Thought content normal.    Review of Systems  Psychiatric/Behavioral: Positive for depression, substance abuse and suicidal ideas. The patient is nervous/anxious and has insomnia.   All other systems reviewed and are negative.   Blood pressure 102/62, pulse (!) 106, temperature 98.1 F (36.7 C), temperature source Oral, resp. rate 16, height 5\' 6"  (1.676 m), weight 74.4 kg, last menstrual period 06/13/2019, SpO2 97 %, unknown if currently breastfeeding.Body mass index is 26.47 kg/m.  General Appearance: Disheveled  Eye Contact:  Minimal  Speech:  Clear and Coherent  Volume:  Decreased  Mood:  Anxious, Depressed, Hopeless and Worthless  Affect:  Congruent, Depressed, Flat and Tearful  Thought Process:  Coherent  Orientation:  Full (Time, Place, and Person)  Thought Content:  Logical  Suicidal Thoughts:  Yes.  with intent/plan  Homicidal Thoughts:  No  Memory:  Immediate;   Good Recent;   Good  Judgement:  Fair  Insight:  Lacking  Psychomotor Activity:  Normal  Concentration:  Concentration: Fair and Attention Span: Fair  Recall:  Good  Fund of Knowledge:  Good  Language:  Good  Akathisia:  Negative  Handed:  Right  AIMS (if indicated):     Assets:  Desire for Improvement Financial Resources/Insurance Housing Social Support  ADL's:   Intact  Cognition:  WNL  Sleep:   Okay with medication (trazodone)     Treatment Plan Summary: Daily contact with patient to assess and evaluate symptoms and progress in treatment, Medication management and Plan Patient does meet criteria for psychiatric inpatient admission once a bed becomes available.  Disposition: Recommend psychiatric Inpatient admission when medically cleared. Supportive therapy provided about ongoing stressors.  Catalina GravelJacqueline Thomspon, NP 06/14/2019 4:21 AM

## 2019-06-14 NOTE — BH Assessment (Signed)
Patient is to be admitted to [facility] by  Psych Nurse Practitioner, Caroline Sauger. Attending Physician will be Dr. Weber Cooks.   Patient has been assigned to room 304, by [facility] Charge Nurse [place] staff is aware of the admission:  Dr. Joni Fears, ER MD   Donneta Romberg, Patient's Nurse   Vonna Kotyk, Patient Access

## 2019-06-14 NOTE — ED Notes (Signed)
poct pregnancy negative. 

## 2019-06-14 NOTE — ED Notes (Signed)
Hourly rounding reveals patient sleeping in room. No complaints, stable, in no acute distress. Q15 minute rounds and monitoring via Security to continue. 

## 2019-06-14 NOTE — BH Assessment (Signed)
Assessment Note  Betty Gomez Brooke Starry is an 33 y.o. female who presents to the ER after reporting SI with family and law enforcement.  Patient was recently inpatient on the medical floor due to an overdose of drugs. She states it wasn't intentional, "I used too much." She was discharged on yesterday (07/0/07/2019) with a safety plan in place. However, when she arrived, DSS came to her house to follow-up with a CPS report. During that time, the patient father became upset and told her she can no longer live in the house. The conversation resulted in a disturbance and law enforcement had to get involved. While law enforcement present, she voiced SI due to feelings of hopelessness.  Patient has a history of poor coping skills, which is evident in her high risk behaviors. She returns to the within several years from been discharged from the medical floor (drug overdose). She no longer have the support she had with her family, which is another risk factor. She's currently involved with the legal system and facing jail time.  During the interview, the patient was calm, cooperative and pleasant. She was able to provide appropriate answers to questions. She acknowledge the use of mind-altering substances; Cannabis, Heroin, Cocaine, Methamphetamine, Percocet & Benzo.  Diagnosis: Bipolar  Past Medical History:  Past Medical History:  Diagnosis Date  . Anxiety   . Asthma   . Depression    ok, came off meds, has someone she sees for this.  . Endometriosis   . Infection    UTI    Past Surgical History:  Procedure Laterality Date  . DIAGNOSTIC LAPAROSCOPY     endometriosis  . TUBAL LIGATION N/A 09/17/2015   Procedure: POST PARTUM TUBAL LIGATION;  Surgeon: Vena AustriaAndreas Staebler, MD;  Location: [facility] ORS;  Service: Gynecology;  Laterality: N/A;  . TUBAL LIGATION Bilateral 09/21/2016   Procedure: POST PARTUM TUBAL LIGATION;  Surgeon: Nadara Mustardobert P Harris, MD;  Location: [facility] ORS;  Service: Gynecology;  Laterality:  Bilateral;    Family History:  Family History  Problem Relation Age of Onset  . Hypertension Mother   . Diabetes Mother   . Heart disease Mother [id number]       failure  . Heart failure Mother   . Depression Father   . Heart disease Maternal Grandfather   . Diabetes Paternal Grandmother   . Hypertension Paternal Grandmother   . Cancer Paternal Grandfather        lung  . Hearing loss Neg Hx     Social History:  reports that she has been smoking cigarettes. She has a 5.00 pack-year smoking history. She has never used smokeless tobacco. She reports current drug use. Drugs: Marijuana, Methamphetamines, and Heroin. She reports that she does not drink alcohol.  Additional Social History:  Alcohol / Drug Use Pain Medications: See PTA Prescriptions: See PTA Over the Counter: See PTA History of alcohol / drug use?: Yes Substance #1 Name of Substance 1: Cannabis Substance #2 Name of Substance 2: Heroin Substance #3 Name of Substance 3: Cocaine Substance #4 Name of Substance 4: Methamphetamine Substance #5 Name of Substance 5: Percocet Substance #6 Name of Substance 6: Benzodiazepines  CIWA: CIWA-Ar BP: 102/62 Pulse Rate: (!) 106 COWS:    Allergies: No Known Allergies  Home Medications: (Not in a hospital admission)   OB/GYN Status:  Patient's last menstrual period was 06/13/2019.  General Assessment Data [facility] Assessment: In system Is this a Tele or Face-to-Face Assessment?: Face-to-Face Is this an  Initial Assessment or a Re-assessment for this encounter?: Initial Assessment Patient Accompanied by:: N/A Language Other than English: No Living Arrangements: Other (Comment)(Private Home) What gender do you identify as?: Female Marital status: Single Pregnancy Status: No Living Arrangements: Alone, Children Can pt return to current living arrangement?: Yes Admission Status: Involuntary Petitioner: Family member Is patient capable of signing  voluntary admission?: No Referral Source: Self/Family/Friend Insurance type: Medicaid  Medical Screening Exam [facility] Walk-in ONLY) Medical Exam completed: Yes  Crisis Care Plan Living Arrangements: Alone, Children Legal Guardian: Other:(Self) Name of Psychiatrist: Reports of none Name of Therapist: [facility]  Education Status Is patient currently in school?: No Is the patient employed, unemployed or receiving disability?: Unemployed     Risk to Others within the past 6 months Homicidal Ideation: No Does patient have any lifetime risk of violence toward others beyond the six months prior to admission? : No Thoughts of Harm to Others: No Current Homicidal Intent: No Current Homicidal Plan: No Access to Homicidal Means: No Identified Victim: Reports of none History of harm to others?: No Assessment of Violence: None Noted Violent Behavior Description: Reports none Does patient have access to weapons?: No Criminal Charges Pending?: No Does patient have a court date: No Is patient on probation?: No  Psychosis Hallucinations: None noted Delusions: None noted  Mental Status Report Appearance/Hygiene: Unremarkable, In scrubs Eye Contact: Good Motor Activity: Freedom of movement, Unremarkable Speech: Logical/coherent, Unremarkable Level of Consciousness: Alert Mood: Depressed, Anxious, Sad, Pleasant Affect: Appropriate to circumstance, Sad, Anxious, Depressed Anxiety Level: None Thought Processes: Coherent, Relevant Judgement: Impaired Orientation: Person, Place, Time, Situation, Appropriate for developmental age Obsessive Compulsive Thoughts/Behaviors: None  Cognitive Functioning Concentration: Normal Memory: Recent Intact, Remote Intact Is patient IDD: No Insight: Fair Impulse Control: Poor Appetite: Good Have you had any weight changes? : No Change Sleep: Decreased Total Hours of Sleep: 2(Trouble going and staying asleep) Vegetative Symptoms: None  ADLScreening [facility]  Assessment Services) Patient's cognitive ability adequate to safely complete daily activities?: Yes Patient able to express need for assistance with ADLs?: Yes Independently performs ADLs?: Yes (appropriate for developmental age)  Prior Inpatient Therapy Prior Inpatient Therapy: Yes Prior Therapy Dates: 11/2018 & 2018 Prior Therapy Facilty/Provider(s): [facility] [place] Reason for Treatment: Bipolar and Substance Use Disorder  Prior Outpatient Therapy Prior Outpatient Therapy: Yes Prior Therapy Dates: Current Prior Therapy Facilty/Provider(s): [facility] Reason for Treatment: Substance Abuse Disorder and Bipolar Does patient have an ACCT team?: No Does patient have Intensive In-House Services?  : No Does patient have [facility] services? : No Does patient have P4CC services?: No  ADL Screening (condition at time of admission) Patient's cognitive ability adequate to safely complete daily activities?: Yes Is the patient deaf or have difficulty hearing?: No Does the patient have difficulty seeing, even when wearing glasses/contacts?: No Does the patient have difficulty concentrating, remembering, or making decisions?: No Patient able to express need for assistance with ADLs?: Yes Does the patient have difficulty dressing or bathing?: No Independently performs ADLs?: Yes (appropriate for developmental age) Does the patient have difficulty walking or climbing stairs?: No Weakness of Legs: None Weakness of Arms/Hands: None  Home Assistive Devices/Equipment Home Assistive Devices/Equipment: None  Therapy Consults (therapy consults require a physician order) PT Evaluation Needed: No OT Evalulation Needed: No SLP Evaluation Needed: No Abuse/Neglect Assessment (Assessment to be complete while patient is alone) Physical Abuse: Denies Verbal Abuse: Denies Sexual Abuse: Denies Exploitation of patient/patient's resources: Denies Self-Neglect: Denies Values / Beliefs Cultural Requests  During Hospitalization:  None Spiritual Requests During Hospitalization: None Consults Spiritual Care Consult Needed: No Social Work Consult Needed: No Regulatory affairs officer (For Healthcare) Does Patient Have a Medical Advance Directive?: No Nutrition Screen- [place] Adult/[facility]/AP Patient's home diet: Regular     Child/Adolescent Assessment Running Away Risk: Denies(Patient is an adult)  Disposition:  Disposition Initial Assessment Completed for this Encounter: Yes  On Site Evaluation by:   Reviewed with Physician:    Gunnar Fusi MS, [place], [facility], [place], [facility] Therapeutic Triage Specialist 06/14/2019 10:21 AM

## 2019-06-14 NOTE — [facility] Suicide Risk Assessment (Signed)
[facility]   Nursing information obtained from:  Patient Demographic factors:  Caucasian Current Mental Status:  NA Loss Factors:  Financial problems / change in socioeconomic status Historical Factors:  Prior suicide attempts Risk Reduction Factors:  Responsible for children under 31 years of age, Living with another person, especially a relative  Total Time spent with patient: 1 hour Principal Problem: MDD (major depressive disorder), severe (HCC) Diagnosis:  Principal Problem:   MDD (major depressive disorder), severe (HCC) Active Problems:   Benzodiazepine abuse (HCC)   Alcohol abuse   Opioid abuse (HCC)  Subjective Data: Patient seen and chart reviewed.  Patient comes to the emergency room stating she is having suicidal ideation and depression.  Tells me that she feels very depressed because she is living such a difficult life and does not feel like she can never get straight or get back to good functioning again.  Talks about having suicidal thoughts but with no specific intention or plan.  Denies hallucinations or psychotic symptoms.  Just discharged from the emergency room last night did not use any drugs in the interim.  Has been off of medicine recently.  Continued Clinical Symptoms:  Alcohol Use Disorder Identification Test Final Score (AUDIT): 1 The "Alcohol Use Disorders Identification Test", Guidelines for Use in Primary Care, Second Edition.  World Science writerHealth Organization [facility](WHO). Score between 0-7:  no or low risk or alcohol related problems. Score between 8-15:  moderate risk of alcohol related problems. Score between 16-19:  high risk of alcohol related problems. Score 20 or above:  warrants further diagnostic evaluation for alcohol dependence and treatment.   CLINICAL FACTORS:   Depression:   Comorbid alcohol abuse/dependence Alcohol/Substance Abuse/Dependencies   Musculoskeletal: Strength & Muscle Tone: within normal limits Gait & Station:  normal Patient leans: N/A  Psychiatric Specialty Exam: Physical Exam  Nursing note and vitals reviewed. Constitutional: She appears well-developed and well-nourished.  HENT:  Head: Normocephalic and atraumatic.  Eyes: Pupils are equal, round, and reactive to light. Conjunctivae are normal.  Neck: Normal range of motion.  Cardiovascular: Regular rhythm and normal heart sounds.  Respiratory: Effort normal. No respiratory distress.  GI: Soft.  Musculoskeletal: Normal range of motion.  Neurological: She is alert.  Skin: Skin is warm and dry.  Psychiatric: Her affect is blunt. Her speech is delayed. She is slowed. She expresses impulsivity. She exhibits a depressed mood. She expresses suicidal ideation. She expresses no suicidal plans. She exhibits abnormal recent memory.    Review of Systems  Constitutional: Negative.   HENT: Negative.   Eyes: Negative.   Respiratory: Negative.   Cardiovascular: Negative.   Gastrointestinal: Negative.   Musculoskeletal: Negative.   Skin: Negative.   Neurological: Negative.   Psychiatric/Behavioral: Positive for depression, substance abuse and suicidal ideas. Negative for hallucinations. The patient is nervous/anxious and has insomnia.     Blood pressure 102/70, pulse 82, temperature 97.7 F (36.5 C), temperature source Oral, resp. rate 16, height 5\' 6"  (1.676 m), weight 73.9 kg, last menstrual period 06/13/2019, SpO2 98 %, unknown if currently breastfeeding.Body mass index is 26.31 kg/m.  General Appearance: Disheveled  Eye Contact:  Fair  Speech:  Clear and Coherent  Volume:  Normal  Mood:  Depressed  Affect:  Depressed  Thought Process:  Coherent  Orientation:  Full (Time, Place, and Person)  Thought Content:  Logical  Suicidal Thoughts:  Yes.  without intent/plan  Homicidal Thoughts:  No  Memory:  Immediate;   Fair Recent;  Fair Remote;   Fair  Judgement:  Impaired  Insight:  Shallow  Psychomotor Activity:  Normal  Concentration:   Concentration: Fair  Recall:  [employer] of Knowledge:  Fair  Language:  Fair  Akathisia:  No  Handed:  Right  AIMS (if indicated):     Assets:  Desire for Improvement Physical Health  ADL's:  Impaired  Cognition:  Impaired,  Mild  Sleep:         COGNITIVE FEATURES THAT CONTRIBUTE TO RISK:  Loss of executive function    SUICIDE RISK:   Mild:  Suicidal ideation of limited frequency, intensity, duration, and specificity.  There are no identifiable plans, no associated intent, mild dysphoria and related symptoms, good self-control (both objective and subjective assessment), few other risk factors, and identifiable protective factors, including available and accessible social support.  PLAN OF CARE: Patient admitted to the psychiatric ward.  15-minute checks employed.  Restart psychiatric medicine as previously.  Engage in group and individual counseling.  Reassess suicidality prior to discharge  I certify that inpatient services furnished can reasonably be expected to improve the patient's condition.   Alethia Berthold, MD 06/14/2019, 5:29 PM

## 2019-06-15 MED ORDER — NICOTINE 21 MG/24HR TD PT24
21.0000 mg | MEDICATED_PATCH | Freq: Every day | TRANSDERMAL | Status: DC
  Administered 2019-06-15 – 2019-06-18 (×5): 21 mg via TRANSDERMAL
  Filled 2019-06-15 (×4): qty 1

## 2019-06-15 NOTE — Progress Notes (Signed)
[facility] MD Progress Note  06/15/2019 3:50 PM Betty Gomez [facility]  MRN:  [id number] Subjective: Patient seen and chart reviewed.  Follow-up for this woman with depression symptoms and substance abuse.  Patient reports she is feeling better today.  Denies any current suicidal thoughts.  States that her goal for the hospitalist to "get back on my medicine" and stay off of drugs.  Tolerating medication without any side effects.  Vital signs stable.  No new lab entries.  Patient came to treatment team and was appropriate in her interaction.  She has declined suggested referral for inpatient treatment. Principal Problem: MDD (major depressive disorder), severe (HCC) Diagnosis: Principal Problem:   MDD (major depressive disorder), severe (HCC) Active Problems:   Benzodiazepine abuse (HCC)   Alcohol abuse   Opioid abuse (HCC)  Total Time spent with patient: 30 minutes  Past Psychiatric History: Patient has a history of substance abuse and depression with difficulty staying sober despite extensive treatment  Past Medical History:  Past Medical History:  Diagnosis Date  . Anxiety   . Asthma   . Depression    ok, came off meds, has someone she sees for this.  . Endometriosis   . Infection    UTI    Past Surgical History:  Procedure Laterality Date  . DIAGNOSTIC LAPAROSCOPY     endometriosis  . TUBAL LIGATION N/A 09/17/2015   Procedure: POST PARTUM TUBAL LIGATION;  Surgeon: Vena Austria, MD;  Location: [facility] ORS;  Service: Gynecology;  Laterality: N/A;  . TUBAL LIGATION Bilateral 09/21/2016   Procedure: POST PARTUM TUBAL LIGATION;  Surgeon: Nadara Mustard, MD;  Location: [facility] ORS;  Service: Gynecology;  Laterality: Bilateral;   Family History:  Family History  Problem Relation Age of Onset  . Hypertension Mother   . Diabetes Mother   . Heart disease Mother 26       failure  . Heart failure Mother   . Depression Father   . Heart disease Maternal Grandfather   . Diabetes Paternal  Grandmother   . Hypertension Paternal Grandmother   . Cancer Paternal Grandfather        lung  . Hearing loss Neg Hx    Family Psychiatric  History: See previous Social History:  Social History   Substance and Sexual Activity  Alcohol Use No     Social History   Substance and Sexual Activity  Drug Use Yes  . Types: Marijuana, Methamphetamines, Heroin   Comment: heroin    Social History   Socioeconomic History  . Marital status: Married    Spouse name: Not on file  . Number of children: Not on file  . Years of education: Not on file  . Highest education level: Not on file  Occupational History  . Not on file  Social Needs  . Financial resource strain: Not on file  . Food insecurity    Worry: Not on file    Inability: Not on file  . Transportation needs    Medical: Not on file    Non-medical: Not on file  Tobacco Use  . Smoking status: Heavy Tobacco Smoker    Packs/day: 0.50    Years: 10.00    Pack years: 5.00    Types: Cigarettes  . Smokeless tobacco: Never Used  Substance and Sexual Activity  . Alcohol use: No  . Drug use: Yes    Types: Marijuana, Methamphetamines, Heroin    Comment: heroin  . Sexual activity: Yes  Lifestyle  . Physical activity  Days per week: Not on file    Minutes per session: Not on file  . Stress: Not on file  Relationships  . Social Musicianconnections    Talks on phone: Not on file    Gets together: Not on file    Attends religious service: Not on file    Active member of club or organization: Not on file    Attends meetings of clubs or organizations: Not on file    Relationship status: Not on file  Other Topics Concern  . Not on file  Social History Narrative  . Not on file   Additional Social History:                         Sleep: Fair  Appetite:  Fair  Current Medications: Current Facility-Administered Medications  Medication Dose Route Frequency Provider Last Rate Last Dose  . acetaminophen (TYLENOL)  tablet 650 mg  650 mg Oral Q6H PRN Thomspon, Adela LankJacqueline, NP      . albuterol (PROVENTIL) (2.5 MG/3ML) 0.083% nebulizer solution 2.5 mg  2.5 mg Inhalation Q6H PRN Catalina Gravelhomspon, Jacqueline, NP      . alum & mag hydroxide-simeth (MAALOX/MYLANTA) 200-200-20 MG/5ML suspension 30 mL  30 mL Oral Q4H PRN Thomspon, Adela LankJacqueline, NP      . FLUoxetine (PROZAC) capsule 80 mg  80 mg Oral Daily , Jackquline Denmark T, MD   80 mg at 06/15/19 0810  . gabapentin (NEURONTIN) capsule 300 mg  300 mg Oral BID ,  T, MD   300 mg at 06/15/19 0810  . hydrOXYzine (ATARAX/VISTARIL) tablet 25 mg  25 mg Oral Q6H PRN Catalina Gravelhomspon, Jacqueline, NP   25 mg at 06/14/19 2158  . magnesium hydroxide (MILK OF MAGNESIA) suspension 30 mL  30 mL Oral Daily PRN Catalina Gravelhomspon, Jacqueline, NP      . thiamine (VITAMIN B-1) tablet 100 mg  100 mg Oral Daily Thomspon, Adela LankJacqueline, NP   100 mg at 06/15/19 0810  . traZODone (DESYREL) tablet 200 mg  200 mg Oral QHS , Jackquline Denmark T, MD   200 mg at 06/14/19 2158    Lab Results:  Results for orders placed or performed during the hospital encounter of 06/14/19 (from the past 48 hour(s))  Comprehensive metabolic panel     Status: Abnormal   Collection Time: 06/13/19 11:51 PM  Result Value Ref Range   Sodium 135 135 - 145 mmol/L   Potassium 3.8 3.5 - 5.1 mmol/L   Chloride 101 98 - 111 mmol/L   CO2 22 22 - 32 mmol/L   Glucose, Bld 125 (H) 70 - 99 mg/dL   BUN 9 6 - 20 mg/dL   Creatinine, Ser 4.090.93 0.44 - 1.00 mg/dL   Calcium 9.5 8.9 - 81.110.3 mg/dL   Total Protein 7.5 6.5 - 8.1 g/dL   Albumin 3.9 3.5 - 5.0 g/dL   AST 66 (H) 15 - 41 U/L   ALT 93 (H) 0 - 44 U/L   Alkaline Phosphatase 93 38 - 126 U/L   Total Bilirubin 0.6 0.3 - 1.2 mg/dL   GFR calc non Af Amer >60 >60 mL/min   GFR calc Af Amer >60 >60 mL/min   Anion gap 12 5 - 15    Comment: Performed at [facility], [address] [place], [place] [address]  Ethanol     Status: None   Collection Time: 06/13/19 11:51 PM  Result Value Ref Range    Alcohol, Ethyl (B) <10 <10 mg/dL  Comment: (NOTE) Lowest detectable limit for serum alcohol is 10 mg/dL. For medical purposes only. Performed at [facility], [address] [place], [place] [address]   cbc     Status: None   Collection Time: 06/13/19 11:51 PM  Result Value Ref Range   WBC 10.5 4.0 - 10.5 K/uL   RBC 4.56 3.87 - 5.11 MIL/uL   Hemoglobin 13.4 12.0 - 15.0 g/dL   HCT 41.0 36.0 - 46.0 %   MCV 89.9 80.0 - 100.0 fL   MCH 29.4 26.0 - 34.0 pg   MCHC 32.7 30.0 - 36.0 g/dL   RDW 12.1 11.5 - 15.5 %   Platelets 268 150 - 400 K/uL   nRBC 0.0 0.0 - 0.2 %    Comment: Performed at [facility], [address] [place], [place] [address]  Urine Drug Screen, Qualitative     Status: Abnormal   Collection Time: 06/14/19 12:04 AM  Result Value Ref Range   Tricyclic, Ur Screen NONE DETECTED NONE DETECTED   Amphetamines, Ur Screen NONE DETECTED NONE DETECTED   MDMA (Ecstasy)Ur Screen NONE DETECTED NONE DETECTED   Cocaine Metabolite,Ur [place] NONE DETECTED NONE DETECTED   Opiate, Ur Screen NONE DETECTED NONE DETECTED   Phencyclidine (PCP) Ur S NONE DETECTED NONE DETECTED   Cannabinoid 50 Ng, Ur [place] POSITIVE (A) NONE DETECTED   Barbiturates, Ur Screen NONE DETECTED NONE DETECTED   Benzodiazepine, Ur Scrn NONE DETECTED NONE DETECTED   Methadone Scn, Ur POSITIVE (A) NONE DETECTED    Comment: (NOTE) Tricyclics + metabolites, urine    Cutoff 1000 ng/mL Amphetamines + metabolites, urine  Cutoff 1000 ng/mL MDMA (Ecstasy), urine              Cutoff 500 ng/mL Cocaine Metabolite, urine          Cutoff 300 ng/mL Opiate + metabolites, urine        Cutoff 300 ng/mL Phencyclidine (PCP), urine         Cutoff 25 ng/mL Cannabinoid, urine                 Cutoff 50 ng/mL Barbiturates + metabolites, urine  Cutoff 200 ng/mL Benzodiazepine, urine              Cutoff 200 ng/mL Methadone, urine                   Cutoff 300 ng/mL The urine drug screen provides only a preliminary,  unconfirmed analytical test result and should not be used for non-medical purposes. Clinical consideration and professional judgment should be applied to any positive drug screen result due to possible interfering substances. A more specific alternate chemical method must be used in order to obtain a confirmed analytical result. Gas chromatography / mass spectrometry (GC/MS) is the preferred confirmat ory method. Performed at [facility], [address] [place], [place] [address]   Pregnancy, urine POC     Status: None   Collection Time: 06/14/19 12:08 AM  Result Value Ref Range   Preg Test, Ur NEGATIVE NEGATIVE    Comment:        THE SENSITIVITY OF THIS METHODOLOGY IS >24 mIU/mL   SARS Coronavirus 2 (CEPHEID - Performed in [facility]), Hosp Order     Status: None   Collection Time: 06/14/19  2:12 AM   Specimen: Nasopharyngeal Swab  Result Value Ref Range   SARS Coronavirus 2 NEGATIVE NEGATIVE    Comment: (NOTE) If result is NEGATIVE  SARS-CoV-2 target nucleic acids are NOT DETECTED. The SARS-CoV-2 RNA is generally detectable in upper and lower  respiratory specimens during the acute phase of infection. The lowest  concentration of SARS-CoV-2 viral copies this assay can detect is 250  copies / mL. A negative result does not preclude SARS-CoV-2 infection  and should not be used as the sole basis for treatment or other  patient management decisions.  A negative result may occur with  improper specimen collection / handling, submission of specimen other  than nasopharyngeal swab, presence of viral mutation(s) within the  areas targeted by this assay, and inadequate number of viral copies  (<250 copies / mL). A negative result must be combined with clinical  observations, patient history, and epidemiological information. If result is POSITIVE SARS-CoV-2 target nucleic acids are DETECTED. The SARS-CoV-2 RNA is generally detectable in upper and lower   respiratory specimens dur ing the acute phase of infection.  Positive  results are indicative of active infection with SARS-CoV-2.  Clinical  correlation with patient history and other diagnostic information is  necessary to determine patient infection status.  Positive results do  not rule out bacterial infection or co-infection with other viruses. If result is PRESUMPTIVE POSTIVE SARS-CoV-2 nucleic acids MAY BE PRESENT.   A presumptive positive result was obtained on the submitted specimen  and confirmed on repeat testing.  While 2019 novel coronavirus  (SARS-CoV-2) nucleic acids may be present in the submitted sample  additional confirmatory testing may be necessary for epidemiological  and / or clinical management purposes  to differentiate between  SARS-CoV-2 and other Sarbecovirus currently known to infect humans.  If clinically indicated additional testing with an alternate test  methodology [phone] is advised. The SARS-CoV-2 RNA is generally  detectable in upper and lower respiratory sp ecimens during the acute  phase of infection. The expected result is Negative. Fact Sheet for Patients:  BoilerBrush.com.cyhttps://www.fda.gov/media/136312/download Fact Sheet for Healthcare Providers: https://pope.com/https://www.fda.gov/media/136313/download This test is not yet approved or cleared by the [place] FDA and has been authorized for detection and/or diagnosis of SARS-CoV-2 by FDA under an Emergency Use Authorization (EUA).  This EUA will remain in effect (meaning this test can be used) for the duration of the COVID-19 declaration under Section 564(b)(1) of the Act, 21 U.S.C. section 360bbb-3(b)(1), unless the authorization is terminated or revoked sooner. Performed at [facility], [address] [place], [place] [address]     Blood Alcohol level:  Lab Results  Component Value Date   [facility] <10 06/13/2019   ETH <10 06/10/2019    Metabolic Disorder Labs: Lab Results  Component Value  Date   HGBA1C 5.0 11/16/2018   MPG 96.8 11/16/2018   No results found for: PROLACTIN Lab Results  Component Value Date   CHOL 108 11/16/2018   TRIG 54 11/16/2018   HDL 52 11/16/2018   CHOLHDL 2.1 11/16/2018   VLDL 11 11/16/2018   LDLCALC 45 11/16/2018    Physical Findings: AIMS:  , ,  ,  ,    CIWA:    COWS:     Musculoskeletal: Strength & Muscle Tone: within normal limits Gait & Station: normal Patient leans: N/A  Psychiatric Specialty Exam: Physical Exam  Nursing note and vitals reviewed. Constitutional: She appears well-developed and well-nourished.  HENT:  Head: Normocephalic and atraumatic.  Eyes: Pupils are equal, round, and reactive to light. Conjunctivae are normal.  Neck: Normal range of motion.  Cardiovascular: Regular rhythm and normal heart sounds.  Respiratory: Effort normal.  GI: Soft.  Musculoskeletal: Normal range of motion.  Neurological: She is alert.  Skin: Skin is warm and dry.  Psychiatric: She has a normal mood and affect. Her speech is normal and behavior is normal. Judgment and thought content normal. Cognition and memory are normal.    Review of Systems  Constitutional: Negative.   HENT: Negative.   Eyes: Negative.   Respiratory: Negative.   Cardiovascular: Negative.   Gastrointestinal: Negative.   Musculoskeletal: Negative.   Skin: Negative.   Neurological: Negative.   Psychiatric/Behavioral: Positive for substance abuse. Negative for depression, hallucinations, memory loss and suicidal ideas. The patient is nervous/anxious. The patient does not have insomnia.     Blood pressure 106/64, pulse 61, temperature 98.2 F (36.8 C), temperature source Oral, resp. rate 18, height 5\' 6"  (1.676 m), weight 73.9 kg, last menstrual period 06/13/2019, SpO2 95 %, unknown if currently breastfeeding.Body mass index is 26.31 kg/m.  General Appearance: Casual  Eye Contact:  Good  Speech:  Clear and Coherent  Volume:  Normal  Mood:  Euthymic  Affect:   Congruent  Thought Process:  Goal Directed  Orientation:  Full (Time, Place, and Person)  Thought Content:  Logical  Suicidal Thoughts:  No  Homicidal Thoughts:  No  Memory:  Immediate;   Fair Recent;   Fair Remote;   Fair  Judgement:  Fair  Insight:  Fair  Psychomotor Activity:  Normal  Concentration:  Concentration: Fair  Recall:  FiservFair  Fund of Knowledge:  Fair  Language:  Fair  Akathisia:  No  Handed:  Right  AIMS (if indicated):     Assets:  Desire for Improvement Physical Health Resilience  ADL's:  Intact  Cognition:  WNL  Sleep:  Number of Hours: 7     Treatment Plan Summary: Daily contact with patient to assess and evaluate symptoms and progress in treatment, Medication management and Plan Counseling and supportive therapy completed.  Review of treatment plan with patient.  Review of medicines.  Patient appears to be stabilizing and will likely be able to be discharged on Monday.  Mordecai RasmussenJohn , MD 06/15/2019, 3:50 PM

## 2019-06-15 NOTE — Progress Notes (Signed)
Recreation Therapy Notes  INPATIENT RECREATION THERAPY ASSESSMENT  Patient Details Name: Betty Gomez MRN: [id number] DOB: 05/20/1986 Today's Date: 06/15/2019       Information Obtained From: Patient  Able to Participate in Assessment/Interview: Yes  Patient Presentation: Responsive  Reason for Admission (Per Patient): Active Symptoms, Substance Abuse  Patient Stressors:    Coping Skills:   Substance Abuse, Exercise  Leisure Interests (2+):  Exercise - Walking, Psychologist, prison and probation services of Recreation/Participation: Financial risk analyst Resources:     [employer]:     Current Use:    If no, Barriers?:    Expressed Interest in [employer] Information:    [employer] of Residence:  [place]  Patient Main Form of Transportation: Diplomatic Services operational officer  Patient Strengths:  Outgoing,Truthful  Patient Identified Areas of Improvement:  Honesty  Patient Goal for Hospitalization:  To get sober  Current SI (including self-harm):  No  Current HI:  No  Current AVH: No  Staff Intervention Plan: Collaborate with Interdisciplinary Treatment Team, Group Attendance  Consent to Intern Participation: N/A  Lorma Heater 06/15/2019, 2:03 PM

## 2019-06-15 NOTE — [facility] Suicide Risk Assessment (Signed)
[place] INPATIENT:  Family/Significant Other Suicide Prevention Education  Suicide Prevention Education:  Contact Attempts: Officer Charisse March, probation officer [phone] has been identified by the patient as the family member/significant other with whom the patient will be residing, and identified as the person(s) who will aid the patient in the event of a mental health crisis.  With written consent from the patient, two attempts were made to provide suicide prevention education, prior to and/or following the patient's discharge.  We were unsuccessful in providing suicide prevention education.  A suicide education pamphlet was given to the patient to share with family/significant other.  Date and time of first attempt: 06/15/2019 at 9:44AM Date and time of second attempt:  CSW left a HIPAA compliant voicemail requesting a call back.   Briny Breezes MSW LCSW 06/15/2019, 9:45 AM

## 2019-06-15 NOTE — Progress Notes (Signed)
Recreation Therapy Notes  Date: 06/15/2019  Time: 9:30 am   Location: Craft room   Behavioral response: N/A   Intervention Topic: Relaxation  Discussion/Intervention: Patient did not attend group.   Clinical Observations/Feedback:  Patient did not attend group.   Ferris Fielden LRT/CTRS        Nusrat Encarnacion 06/15/2019 10:46 AM

## 2019-06-15 NOTE — Progress Notes (Signed)
Everytime I have seen Dove today she has been in the bed. I have reminded her each time that it was meal time. She however said that she did go to groups. She also plans to take a shower tonight. A nicotine patch was ordered at her request. Collier Bullock RN

## 2019-06-15 NOTE — Progress Notes (Signed)
Patient alert and oriented x 4 , affect is blunted , she is appropriate with peers and staff, thoughts are organized , visible in the milieu interacting in the dayroom, no distress noted, denies SI/HI/AVH. Patient expressed concerns about her pending legal issues her failed marriage and substance abuse.  Patient was making appropriate eye contact, she appeared to have some insight into her treatment plan, and she is complaint with medication. 15 minutes safety checks maintained will continue to monitor.
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
## 2019-06-15 NOTE — Tx Team (Addendum)
Interdisciplinary Treatment and Diagnostic Plan Update  06/15/2019 Time of Session: [place] MRN: [id number]  Principal Diagnosis: MDD (major depressive disorder), severe ([place])  Secondary Diagnoses: Principal Problem:   MDD (major depressive disorder), severe ([place]) Active Problems:   Benzodiazepine abuse ([place])   Alcohol abuse   Opioid abuse ([place])   Current Medications:  Current Facility-Administered Medications  Medication Dose Route Frequency Provider Last Rate Last Dose  . acetaminophen (TYLENOL) tablet 650 mg  650 mg Oral Q6H PRN Thomspon, Geni Bers, NP      . albuterol (PROVENTIL) (2.5 MG/3ML) 0.083% nebulizer solution 2.5 mg  2.5 mg Inhalation Q6H PRN Lamont Dowdy, NP      . alum & mag hydroxide-simeth (MAALOX/MYLANTA) 200-200-20 MG/5ML suspension 30 mL  30 mL Oral Q4H PRN Thomspon, Geni Bers, NP      . FLUoxetine (PROZAC) capsule 80 mg  80 mg Oral Daily Clapacs, Madie Reno, MD   80 mg at 06/15/19 0810  . gabapentin (NEURONTIN) capsule 300 mg  300 mg Oral BID Clapacs, John T, MD   300 mg at 06/15/19 0810  . hydrOXYzine (ATARAX/VISTARIL) tablet 25 mg  25 mg Oral Q6H PRN Lamont Dowdy, NP   25 mg at 06/14/19 2158  . magnesium hydroxide (MILK OF MAGNESIA) suspension 30 mL  30 mL Oral Daily PRN Lamont Dowdy, NP      . thiamine (VITAMIN B-1) tablet 100 mg  100 mg Oral Daily Thomspon, Geni Bers, NP   100 mg at 06/15/19 0810  . traZODone (DESYREL) tablet 200 mg  200 mg Oral QHS Clapacs, John T, MD   200 mg at 06/14/19 2158   PTA Medications: Medications Prior to Admission  Medication Sig Dispense Refill Last Dose  . albuterol (VENTOLIN HFA) 108 (90 Base) MCG/ACT inhaler Inhale 2 puffs into the lungs every 6 (six) hours as needed for wheezing or shortness of breath. 18 g 0 prn at prn  . beclomethasone (QVAR REDIHALER) 40 MCG/ACT inhaler Inhale 2 puffs into the lungs 2 (two) times daily. 10.6 g 0 unknown at unknown  . naloxone (NARCAN) nasal spray 4  mg/0.1 mL One spray for overdose 1 each 0 unknown at unknown    Patient Stressors: Legal issue Medication change or noncompliance Substance abuse  Patient Strengths: Ability for insight Average or above average intelligence Communication skills Supportive family/friends  Treatment Modalities: Medication Management, Group therapy, Case management,  1 to 1 session with clinician, Psychoeducation, Recreational therapy.   Physician Treatment Plan for Primary Diagnosis: MDD (major depressive disorder), severe ([place]) Long Term Goal(s): Improvement in symptoms so as ready for discharge Improvement in symptoms so as ready for discharge   Short Term Goals: Ability to disclose and discuss suicidal ideas Compliance with prescribed medications will improve Ability to identify triggers associated with substance abuse/mental health issues will improve  Medication Management: Evaluate patient's response, side effects, and tolerance of medication regimen.  Therapeutic Interventions: 1 to 1 sessions, Unit Group sessions and Medication administration.  Evaluation of Outcomes: Not Met  Physician Treatment Plan for Secondary Diagnosis: Principal Problem:   MDD (major depressive disorder), severe ([place]) Active Problems:   Benzodiazepine abuse ([place])   Alcohol abuse   Opioid abuse ([place])  Long Term Goal(s): Improvement in symptoms so as ready for discharge Improvement in symptoms so as ready for discharge   Short Term Goals: Ability to disclose and discuss suicidal ideas Compliance with prescribed medications will improve Ability to identify triggers associated with substance abuse/mental health issues will improve  Medication Management: Evaluate patient's response, side effects, and tolerance of medication regimen.  Therapeutic Interventions: 1 to 1 sessions, Unit Group sessions and Medication administration.  Evaluation of Outcomes: Not Met   RN Treatment Plan for Primary Diagnosis:  MDD (major depressive disorder), severe ([place]) Long Term Goal(s): Knowledge of disease and therapeutic regimen to maintain health will improve  Short Term Goals: Ability to participate in decision making will improve, Ability to verbalize feelings will improve, Ability to disclose and discuss suicidal ideas, Ability to identify and develop effective coping behaviors will improve and Compliance with prescribed medications will improve  Medication Management: RN will administer medications as ordered by provider, will assess and evaluate patient's response and provide education to patient for prescribed medication. RN will report any adverse and/or side effects to prescribing provider.  Therapeutic Interventions: 1 on 1 counseling sessions, Psychoeducation, Medication administration, Evaluate responses to treatment, Monitor vital signs and CBGs as ordered, Perform/monitor CIWA, COWS, AIMS and Fall Risk screenings as ordered, Perform wound care treatments as ordered.  Evaluation of Outcomes: Not Met   LCSW Treatment Plan for Primary Diagnosis: MDD (major depressive disorder), severe ([place]) Long Term Goal(s): Safe transition to appropriate next level of care at discharge, Engage patient in therapeutic group addressing interpersonal concerns.  Short Term Goals: Engage patient in aftercare planning with referrals and resources  Therapeutic Interventions: Assess for all discharge needs, 1 to 1 time with Social worker, Explore available resources and support systems, Assess for adequacy in community support network, Educate family and significant other(s) on suicide prevention, Complete Psychosocial Assessment, Interpersonal group therapy.  Evaluation of Outcomes: Not Met   Progress in Treatment: Attending groups: No. Participating in groups: No. Taking medication as prescribed: Yes. Toleration medication: Yes. Family/Significant other contact made: No, will contact:  Officer Charisse March, probation  officer Patient understands diagnosis: Yes. Discussing patient identified problems/goals with staff: Yes. Medical problems stabilized or resolved: No. Denies suicidal/homicidal ideation: Yes. Issues/concerns per patient self-inventory: No. Other: NA  New problem(s) identified: No, Describe:  none reported  New Short Term/Long Term Goal(s):Attend outpatient treatment, take medication as prescribed and develop and implement healthy coping methods  Patient Goals:  "Get back on my medication and stay drug free"  Discharge Plan or Barriers: Pt will return home and follow up at [facility], pt declines residential tx at this time.  Reason for Continuation of Hospitalization: Depression Medication stabilization  Estimated Length of Stay: 3-5 days  Recreational Therapy: Patient: N/A Patient Goal: Patient will engage in groups without prompting or encouragement from LRT x3 group sessions within 5 recreation therapy group sessions  Attendees: Patient:Teletha T[place]ington 06/15/2019 2:53 PM  Physician: Alethia Berthold 06/15/2019 2:53 PM  Nursing:  06/15/2019 2:53 PM  RN Care Manager: 06/15/2019 2:53 PM  Social Worker: Darren Lehman Prom Assunta Curtis [place] 06/15/2019 2:53 PM  Recreational Therapist: Roanna Epley 06/15/2019 2:53 PM  Other:  06/15/2019 2:53 PM  Other:  06/15/2019 2:53 PM  Other: 06/15/2019 2:53 PM    Scribe for Treatment Team: Yvette Rack, LCSW 06/15/2019 2:53 PM

## 2019-06-15 NOTE — BHH Group Notes (Signed)
Feelings Around Relapse 06/15/2019 1PM  Type of Therapy and Topic:  Group Therapy:  Feelings around Relapse and Recovery  Participation Level:  Did Not Attend   Description of Group:    Patients in this group will discuss emotions they experience before and after a relapse. They will process how experiencing these feelings, or avoidance of experiencing them, relates to having a relapse. Facilitator will guide patients to explore emotions they have related to recovery. Patients will be encouraged to process which emotions are more powerful. They will be guided to discuss the emotional reaction significant others in their lives may have to patients' relapse or recovery. Patients will be assisted in exploring ways to respond to the emotions of others without this contributing to a relapse.  Therapeutic Goals: 1. Patient will identify two or more emotions that lead to a relapse for them 2. Patient will identify two emotions that result when they relapse 3. Patient will identify two emotions related to recovery 4. Patient will demonstrate ability to communicate their needs through discussion and/or role plays   Summary of Patient Progress:     Therapeutic Modalities:   Cognitive Behavioral Therapy Solution-Focused Therapy Assertiveness Training Relapse Prevention Therapy   Yvette Rack, LCSW 06/15/2019 1:43 PM

## 2019-06-15 NOTE — Plan of Care (Addendum)
Pt rates depression 3/10 and anxiety 7/10. Pt denies SI, HI and AVH. Pt has a goal "to talk myself though this". Pt was educated on care plan and verbalizes understanding. Collier Bullock RN Problem: Education: Goal: Knowledge of [place] [facility] Education information/materials will improve Outcome: Progressing   Problem: Coping: Goal: Ability to verbalize frustrations and anger appropriately will improve Outcome: Progressing Goal: Ability to demonstrate self-control will improve Outcome: Progressing   Problem: Safety: Goal: Periods of time without injury will increase Outcome: Progressing   Problem: Education: Goal: Utilization of techniques to improve thought processes will improve Outcome: Progressing Goal: Knowledge of the prescribed therapeutic regimen will improve Outcome: Progressing   Problem: Medication: Goal: Compliance with prescribed medication regimen will improve Outcome: Progressing   Problem: Self-Concept: Goal: Will verbalize positive feelings about self Outcome: Not Progressing   Problem: Education: Goal: Knowledge of disease or condition will improve Outcome: Progressing

## 2019-06-16 NOTE — Progress Notes (Signed)
D: Patient has been complaining about her ankle being sore. Ankle does not appear very swollen and patient is ambulating around the unit without difficulty. Given pain medication per prn order. In dayroom socializing with peers. Denies SI, HI and AVH.  A: Continue to monitor for safety. R: Safety maintained

## 2019-06-16 NOTE — Plan of Care (Signed)
Stable no respiratory issues.

## 2019-06-16 NOTE — Plan of Care (Signed)
  Problem: Education: Goal: Knowledge of [facility] Education information/materials will improve 06/16/2019 0550 by Clemens Catholic, RN Outcome: Progressing 06/16/2019 0550 by Clemens Catholic, RN Outcome: Progressing   Problem: Coping: Goal: Ability to verbalize frustrations and anger appropriately will improve 06/16/2019 0550 by Clemens Catholic, RN Outcome: Progressing 06/16/2019 0550 by Clemens Catholic, RN Outcome: Progressing Goal: Ability to demonstrate self-control will improve 06/16/2019 0550 by Clemens Catholic, RN Outcome: Progressing 06/16/2019 0550 by Clemens Catholic, RN Outcome: Progressing   Problem: Safety: Goal: Periods of time without injury will increase 06/16/2019 0550 by Clemens Catholic, RN Outcome: Progressing 06/16/2019 0550 by Clemens Catholic, RN Outcome: Progressing   Problem: Education: Goal: Utilization of techniques to improve thought processes will improve 06/16/2019 0550 by Clemens Catholic, RN Outcome: Progressing 06/16/2019 0550 by Clemens Catholic, RN Outcome: Progressing Goal: Knowledge of the prescribed therapeutic regimen will improve 06/16/2019 0550 by Clemens Catholic, RN Outcome: Progressing 06/16/2019 0550 by Clemens Catholic, RN Outcome: Progressing   Problem: Medication: Goal: Compliance with prescribed medication regimen will improve 06/16/2019 0550 by Clemens Catholic, RN Outcome: Progressing 06/16/2019 0550 by Clemens Catholic, RN Outcome: Progressing   Problem: Self-Concept: Goal: Will verbalize positive feelings about self 06/16/2019 0550 by Clemens Catholic, RN Outcome: Progressing 06/16/2019 0550 by Clemens Catholic, RN Outcome: Progressing   Problem: Education: Goal: Knowledge of disease or condition will improve 06/16/2019 0550 by Clemens Catholic, RN Outcome: Progressing 06/16/2019 0550 by Clemens Catholic, RN Outcome: Progressing

## 2019-06-16 NOTE — Progress Notes (Signed)
[facility]  06/16/2019 9:20 AM Betty Gomez  MRN:  [id number] Subjective:   Patient is seen she is resting in bed she is alert and fully oriented and cooperative believes she needs to get away from her father with whom she was arguing, get her head straight relax and get off of drugs she has been abusing cannabis/drug screen showed opiates as well.  She is alert and oriented as mentioned denies wanting to do harm herself at this point can contract for safety here no psychosis/no cravings tremors or withdrawal symptoms Principal Problem: MDD (major depressive disorder), severe (HCC) Diagnosis: Principal Problem:   MDD (major depressive disorder), severe (HCC) Active Problems:   Benzodiazepine abuse (HCC)   Alcohol abuse   Opioid abuse (HCC)  Total Time spent with patient: 20 minutes  Past Medical History:  Past Medical History:  Diagnosis Date  . Anxiety   . Asthma   . Depression    ok, came off meds, has someone she sees for this.  . Endometriosis   . Infection    UTI    Past Surgical History:  Procedure Laterality Date  . DIAGNOSTIC LAPAROSCOPY     endometriosis  . TUBAL LIGATION N/A 09/17/2015   Procedure: POST PARTUM TUBAL LIGATION;  Surgeon: Vena AustriaAndreas Staebler, MD;  Location: [facility] ORS;  Service: Gynecology;  Laterality: N/A;  . TUBAL LIGATION Bilateral 09/21/2016   Procedure: POST PARTUM TUBAL LIGATION;  Surgeon: Nadara Mustardobert P Harris, MD;  Location: [facility] ORS;  Service: Gynecology;  Laterality: Bilateral;   Family History:  Family History  Problem Relation Age of Onset  . Hypertension Mother   . Diabetes Mother   . Heart disease Mother 2434       failure  . Heart failure Mother   . Depression Father   . Heart disease Maternal Grandfather   . Diabetes Paternal Grandmother   . Hypertension Paternal Grandmother   . Cancer Paternal Grandfather        lung  . Hearing loss Neg Hx    Family Psychiatric  History: no new data Social History:  Social History    Substance and Sexual Activity  Alcohol Use No     Social History   Substance and Sexual Activity  Drug Use Yes  . Types: Marijuana, Methamphetamines, Heroin   Comment: heroin    Social History   Socioeconomic History  . Marital status: Married    Spouse name: Not on file  . Number of children: Not on file  . Years of education: Not on file  . Highest education level: Not on file  Occupational History  . Not on file  Social Needs  . Financial resource strain: Not on file  . Food insecurity    Worry: Not on file    Inability: Not on file  . Transportation needs    Medical: Not on file    Non-medical: Not on file  Tobacco Use  . Smoking status: Heavy Tobacco Smoker    Packs/day: 0.50    Years: 10.00    Pack years: 5.00    Types: Cigarettes  . Smokeless tobacco: Never Used  Substance and Sexual Activity  . Alcohol use: No  . Drug use: Yes    Types: Marijuana, Methamphetamines, Heroin    Comment: heroin  . Sexual activity: Yes  Lifestyle  . Physical activity    Days per week: Not on file    Minutes per session: Not on file  . Stress: Not on file  Relationships  . Social Herbalist on phone: Not on file    Gets together: Not on file    Attends religious service: Not on file    Active member of club or organization: Not on file    Attends meetings of clubs or organizations: Not on file    Relationship status: Not on file  Other Topics Concern  . Not on file  Social History Narrative  . Not on file   Additional Social History:                         Sleep: Fair  Appetite:  Fair  Current Medications: Current Facility-Administered Medications  Medication Dose Route Frequency Provider Last Rate Last Dose  . acetaminophen (TYLENOL) tablet 650 mg  650 mg Oral Q6H PRN Thomspon, Geni Bers, NP      . albuterol (PROVENTIL) (2.5 MG/3ML) 0.083% nebulizer solution 2.5 mg  2.5 mg Inhalation Q6H PRN Lamont Dowdy, NP      . alum & mag  hydroxide-simeth (MAALOX/MYLANTA) 200-200-20 MG/5ML suspension 30 mL  30 mL Oral Q4H PRN Thomspon, Geni Bers, NP      . FLUoxetine (PROZAC) capsule 80 mg  80 mg Oral Daily Clapacs, Madie Reno, MD   80 mg at 06/16/19 0751  . gabapentin (NEURONTIN) capsule 300 mg  300 mg Oral BID Clapacs, Madie Reno, MD   300 mg at 06/16/19 0751  . hydrOXYzine (ATARAX/VISTARIL) tablet 25 mg  25 mg Oral Q6H PRN Lamont Dowdy, NP   25 mg at 06/16/19 0751  . magnesium hydroxide (MILK OF MAGNESIA) suspension 30 mL  30 mL Oral Daily PRN Lamont Dowdy, NP      . nicotine (NICODERM CQ - dosed in mg/24 hours) patch 21 mg  21 mg Transdermal Daily Clapacs, Madie Reno, MD   21 mg at 06/16/19 0752  . thiamine (VITAMIN B-1) tablet 100 mg  100 mg Oral Daily Lamont Dowdy, NP   100 mg at 06/16/19 0751  . traZODone (DESYREL) tablet 200 mg  200 mg Oral QHS Clapacs, John T, MD   200 mg at 06/15/19 2159    Lab Results: No results found for this or any previous visit (from the past 48 hour(s)).  Blood Alcohol level:  Lab Results  Component Value Date   ETH <10 06/13/2019   ETH <10 78/46/9629    Metabolic Disorder Labs: Lab Results  Component Value Date   HGBA1C 5.0 11/16/2018   MPG 96.8 11/16/2018   No results found for: PROLACTIN Lab Results  Component Value Date   CHOL 108 11/16/2018   TRIG 54 11/16/2018   HDL 52 11/16/2018   CHOLHDL 2.1 11/16/2018   VLDL 11 11/16/2018   LDLCALC 45 11/16/2018    Physical Findings: AIMS:  , ,  ,  ,    CIWA:    COWS:     Musculoskeletal: Strength & Muscle Tone: within normal limits Gait & Station: normal Patient leans: N/A  Psychiatric Specialty Exam: Physical Exam  ROS  Blood pressure 104/64, pulse 65, temperature (!) 97.5 F (36.4 C), temperature source Oral, resp. rate 18, height 5\' 6"  (1.676 m), weight 73.9 kg, last menstrual period 06/13/2019, SpO2 98 %, unknown if currently breastfeeding.Body mass index is 26.31 kg/m.  General Appearance: Casual  Eye  Contact:  Good  Speech:  Clear and Coherent  Volume:  Decreased  Mood:  Depressed  Affect:  Blunt  Thought Process:  Goal Directed  Orientation:  Full (Time, Place, and Person)  Thought Content:  Logical  Suicidal Thoughts:  No  Homicidal Thoughts:  No  Memory:  Immediate;   Fair  Judgement:  Fair  Insight:  Fair  Psychomotor Activity:  Decreased  Concentration:  Concentration: Fair  Recall:  [employer]Fair  Fund of Knowledge:  Fair  Language:  Fair  Akathisia:  Negative  Handed:  Right  AIMS (if indicated):     Assets:  Communication Skills Desire for Improvement  ADL's:  Intact  Cognition:  WNL  Sleep:  Number of Hours: 7     Treatment Plan Summary: Daily contact with patient to assess and evaluate symptoms and progress in treatment, Medication management and Plan Continue current cognitive and rehab therapy monitor for withdrawal blood pressure is low so she is probably not having withdrawal discussed possibility of gabapentin for cravings so forth monitor on current 15-minute precautions  Jahnai Slingerland, MD 06/16/2019, 9:20 AM

## 2019-06-16 NOTE — Plan of Care (Signed)
Pt rates depression 2/10 and anxiety 6/10. Hopelessness also 6/10. Pt denies SI. HI and AVH. Pt got a Prn for anxiety. Pt was educated on care plan and verbalizes understanding. Collier Bullock RN Problem: Education: Goal: Knowledge of [facility] Education information/materials will improve Outcome: Progressing   Problem: Coping: Goal: Ability to verbalize frustrations and anger appropriately will improve Outcome: Progressing Goal: Ability to demonstrate self-control will improve Outcome: Progressing   Problem: Safety: Goal: Periods of time without injury will increase Outcome: Progressing   Problem: Education: Goal: Utilization of techniques to improve thought processes will improve Outcome: Progressing Goal: Knowledge of the prescribed therapeutic regimen will improve Outcome: Progressing   Problem: Medication: Goal: Compliance with prescribed medication regimen will improve Outcome: Progressing   Problem: Self-Concept: Goal: Will verbalize positive feelings about self Outcome: Not Progressing   Problem: Education: Goal: Knowledge of disease or condition will improve Outcome: Progressing

## 2019-06-16 NOTE — [facility] Group Notes (Signed)
LCSW Group Therapy Note   06/16/2019 1:15pm   Type of Therapy and Topic:  Group Therapy:  Trust and Honesty  Participation Level:  Did Not Attend  Description of Group:    In this group patients will be asked to explore the value of being honest.  Patients will be guided to discuss their thoughts, feelings, and behaviors related to honesty and trusting in others. Patients will process together how trust and honesty relate to forming relationships with peers, family members, and self. Each patient will be challenged to identify and express feelings of being vulnerable. Patients will discuss reasons why people are dishonest and identify alternative outcomes if one was truthful (to self or others). This group will be process-oriented, with patients participating in exploration of their own experiences, giving and receiving support, and processing challenge from other group members.   Therapeutic Goals: 1. Patient will identify why honesty is important to relationships and how honesty overall affects relationships.  2. Patient will identify a situation where they lied or were lied too and the  feelings, thought process, and behaviors surrounding the situation 3. Patient will identify the meaning of being vulnerable, how that feels, and how that correlates to being honest with self and others. 4. Patient will identify situations where they could have told the truth, but instead lied and explain reasons of dishonesty.   Summary of Patient Progress Pt was invited to attend group but chose not to attend. CSW will continue to encourage pt to attend group throughout their admission.     Therapeutic Modalities:   Cognitive Behavioral Therapy Solution Focused Therapy Motivational Interviewing Brief Therapy  Jaya Lapka  CUEBAS-COLON, LCSW 06/16/2019 12:47 PM  

## 2019-06-16 NOTE — Plan of Care (Signed)
  Problem: Education: Goal: Knowledge of [facility] Education information/materials will improve Outcome: Progressing  D: Patient has been complaining about her ankle being sore. Ankle does not appear very swollen and patient is ambulating around the unit without difficulty. Given pain medication per prn order. In dayroom socializing with peers. Denies SI, HI and AVH.  A: Continue to monitor for safety. R: Safety maintained

## 2019-06-16 NOTE — Progress Notes (Signed)
Pt has been isolative on her room sleeping today, She does come to the day room for meals and is in there now watching tv. Collier Bullock RN

## 2019-06-16 NOTE — Progress Notes (Signed)
Patient slept through the night with out requiring any sleep aid and no complains , stable no acte respiratory arrest on thi shift. Denies SI/HI/AVH Only 15 minutes safety checks maintained no distress

## 2019-06-17 NOTE — [facility] Group Notes (Signed)
LCSW Group Therapy Note 06/17/2019 1:15pm  Type of Therapy and Topic: Group Therapy: Feelings Around Returning Home & Establishing a Supportive Framework and Supporting Oneself When Supports Not Available  Participation Level: Did Not Attend  Description of Group:  Patients first processed thoughts and feelings about upcoming discharge. These included fears of upcoming changes, lack of change, new living environments, judgements and expectations from others and overall stigma of mental health issues. The group then discussed the definition of a supportive framework, what that looks and feels like, and how do to discern it from an unhealthy non-supportive network. The group identified different types of supports as well as what to do when your family/friends are less than helpful or unavailable  Therapeutic Goals  1. Patient will identify one healthy supportive network that they can use at discharge. 2. Patient will identify one factor of a supportive framework and how to tell it from an unhealthy network. 3. Patient able to identify one coping skill to use when they do not have positive supports from others. 4. Patient will demonstrate ability to communicate their needs through discussion and/or role plays.  Summary of Patient Progress:  Pt was invited to attend group but chose not to attend. CSW will continue to encourage pt to attend group throughout their admission.   Therapeutic Modalities Cognitive Behavioral Therapy Motivational Interviewing   Weronika Birch  CUEBAS-COLON, LCSW 06/17/2019 3:41 PM

## 2019-06-17 NOTE — Plan of Care (Signed)
D- Patient alert and oriented. Patient presented in a pleasant mood on assessment stating that she slept well last night, "I was dreaming good and didn't want to wake up". Patient had complaints of ankle pain, rating it a "6/10", and she did request medication from this Probation officer. Patient denied SI, HI, AVH, at this time. Patient also denied any signs/symptoms of depression/anxiety stating to this writer "I feel good today". Patient's goal for today is to "be a better person".  A- Scheduled medications administered to patient, per MD orders. Support and encouragement provided.  Routine safety checks conducted every 15 minutes.  Patient informed to notify staff with problems or concerns.  R- No adverse drug reactions noted. Patient contracts for safety at this time. Patient compliant with medications and treatment plan. Patient receptive, calm, and cooperative. Patient interacts well with others on the unit.  Patient remains safe at this time.  Problem: Education: Goal: Knowledge of [facility] Education information/materials will improve Outcome: Progressing   Problem: Coping: Goal: Ability to verbalize frustrations and anger appropriately will improve Outcome: Progressing Goal: Ability to demonstrate self-control will improve Outcome: Progressing   Problem: Safety: Goal: Periods of time without injury will increase Outcome: Progressing   Problem: Education: Goal: Utilization of techniques to improve thought processes will improve Outcome: Progressing Goal: Knowledge of the prescribed therapeutic regimen will improve Outcome: Progressing   Problem: Medication: Goal: Compliance with prescribed medication regimen will improve Outcome: Progressing   Problem: Self-Concept: Goal: Will verbalize positive feelings about self Outcome: Progressing   Problem: Education: Goal: Knowledge of disease or condition will improve Outcome: Progressing

## 2019-06-17 NOTE — Progress Notes (Signed)
This Probation officer went to ask patient once again if she was going to take her morning medication and she stated to this writer "yes ma'am". This write is still waiting on patient.

## 2019-06-17 NOTE — Progress Notes (Signed)
This writer went to check on patient and to see if she was going to come take her morning medication and she stated "I'm coming, give me a few minutes".

## 2019-06-17 NOTE — Progress Notes (Signed)
[facility]  06/17/2019 8:33 AM Betty Gomez  MRN:  [id number] Subjective:   Patient requesting discharge by tomorrow states she has got enough rest and her anxiety has settled down and she does not want to hurt her self.  Denies thoughts of harming others.  Felt she had to get away before her condition worsen but believes she has achieved her goal.  Alert and oriented to person place time situation and day, no thoughts of harming self or others, no auditory or visual hallucinations.  Mood is improved.  With regards to chemical dependency issues denies any cravings tremors or withdrawal symptoms denies the need for detox measures at this point Principal Problem: MDD (major depressive disorder), severe (HCC) Diagnosis: Principal Problem:   MDD (major depressive disorder), severe (HCC) Active Problems:   Benzodiazepine abuse (HCC)   Alcohol abuse   Opioid abuse (HCC)  Total Time spent with patient: 20 minutes  Past Psychiatric History: Polysubstance abuse  Past Medical History:  Past Medical History:  Diagnosis Date  . Anxiety   . Asthma   . Depression    ok, came off meds, has someone she sees for this.  . Endometriosis   . Infection    UTI    Past Surgical History:  Procedure Laterality Date  . DIAGNOSTIC LAPAROSCOPY     endometriosis  . TUBAL LIGATION N/A 09/17/2015   Procedure: POST PARTUM TUBAL LIGATION;  Surgeon: Vena AustriaAndreas Staebler, MD;  Location: [facility] OR[facility];  Service: Gynecology;  Laterality: N/A;  . TUBAL LIGATION Bilateral 09/21/2016   Procedure: POST PARTUM TUBAL LIGATION;  Surgeon: Nadara Mustardobert P Harris, MD;  Location: [facility] OR[facility];  Service: Gynecology;  Laterality: Bilateral;   Family History:  Family History  Problem Relation Age of Onset  . Hypertension Mother   . Diabetes Mother   . Heart disease Mother 2734       failure  . Heart failure Mother   . Depression Father   . Heart disease Maternal Grandfather   . Diabetes Paternal Grandmother   . Hypertension  Paternal Grandmother   . Cancer Paternal Grandfather        lung  . Hearing loss Neg Hx    Family Psychiatric  History: no new data  Social History:  Social History   Substance and Sexual Activity  Alcohol Use No     Social History   Substance and Sexual Activity  Drug Use Yes  . Types: Marijuana, Methamphetamines, Heroin   Comment: heroin    Social History   Socioeconomic History  . Marital status: Married    Spouse name: Not on file  . Number of children: Not on file  . Years of education: Not on file  . Highest education level: Not on file  Occupational History  . Not on file  Social Needs  . Financial resource strain: Not on file  . Food insecurity    Worry: Not on file    Inability: Not on file  . Transportation needs    Medical: Not on file    Non-medical: Not on file  Tobacco Use  . Smoking status: Heavy Tobacco Smoker    Packs/day: 0.50    Years: 10.00    Pack years: 5.00    Types: Cigarettes  . Smokeless tobacco: Never Used  Substance and Sexual Activity  . Alcohol use: No  . Drug use: Yes    Types: Marijuana, Methamphetamines, Heroin    Comment: heroin  . Sexual activity: Yes  Lifestyle  .  Physical activity    Days per week: Not on file    Minutes per session: Not on file  . Stress: Not on file  Relationships  . Social Musicianconnections    Talks on phone: Not on file    Gets together: Not on file    Attends religious service: Not on file    Active member of club or organization: Not on file    Attends meetings of clubs or organizations: Not on file    Relationship status: Not on file  Other Topics Concern  . Not on file  Social History Narrative  . Not on file   Additional Social History:                         Sleep: Good  Appetite:  Good  Current Medications: Current Facility-Administered Medications  Medication Dose Route Frequency Provider Last Rate Last Dose  . acetaminophen (TYLENOL) tablet 650 mg  650 mg Oral Q6H PRN  Catalina Gravelhomspon, Jacqueline, NP   650 mg at 06/16/19 1647  . albuterol (PROVENTIL) (2.5 MG/3ML) 0.083% nebulizer solution 2.5 mg  2.5 mg Inhalation Q6H PRN Catalina Gravelhomspon, Jacqueline, NP      . alum & mag hydroxide-simeth (MAALOX/MYLANTA) 200-200-20 MG/5ML suspension 30 mL  30 mL Oral Q4H PRN Thomspon, Adela LankJacqueline, NP      . FLUoxetine (PROZAC) capsule 80 mg  80 mg Oral Daily Clapacs, Jackquline DenmarkJohn T, MD   80 mg at 06/16/19 0751  . gabapentin (NEURONTIN) capsule 300 mg  300 mg Oral BID Clapacs, John T, MD   300 mg at 06/16/19 1646  . hydrOXYzine (ATARAX/VISTARIL) tablet 25 mg  25 mg Oral Q6H PRN Catalina Gravelhomspon, Jacqueline, NP   25 mg at 06/16/19 0751  . magnesium hydroxide (MILK OF MAGNESIA) suspension 30 mL  30 mL Oral Daily PRN Catalina Gravelhomspon, Jacqueline, NP      . nicotine (NICODERM CQ - dosed in mg/24 hours) patch 21 mg  21 mg Transdermal Daily Clapacs, Jackquline DenmarkJohn T, MD   21 mg at 06/16/19 0752  . thiamine (VITAMIN B-1) tablet 100 mg  100 mg Oral Daily Catalina Gravelhomspon, Jacqueline, NP   100 mg at 06/16/19 0751  . traZODone (DESYREL) tablet 200 mg  200 mg Oral QHS Clapacs, Jackquline DenmarkJohn T, MD   200 mg at 06/16/19 2137    Lab Results: No results found for this or any previous visit (from the past 48 hour(s)).  Blood Alcohol level:  Lab Results  Component Value Date   ETH <10 06/13/2019   ETH <10 06/10/2019    Metabolic Disorder Labs: Lab Results  Component Value Date   HGBA1C 5.0 11/16/2018   MPG 96.8 11/16/2018   No results found for: PROLACTIN Lab Results  Component Value Date   CHOL 108 11/16/2018   TRIG 54 11/16/2018   HDL 52 11/16/2018   CHOLHDL 2.1 11/16/2018   VLDL 11 11/16/2018   LDLCALC 45 11/16/2018    Physical Findings: AIMS:  , ,  ,  ,    CIWA:    COWS:     Musculoskeletal: Strength & Muscle Tone: within normal limits Gait & Station: normal Patient leans: N/A  Psychiatric Specialty Exam: Physical Exam  ROS  Blood pressure 104/68, pulse 69, temperature 98 F (36.7 C), temperature source Oral, resp. rate 18,  height 5\' 6"  (1.676 m), weight 73.9 kg, last menstrual period 06/13/2019, SpO2 98 %, unknown if currently breastfeeding.Body mass index is 26.31 kg/m.  General Appearance: Casual  Eye  Contact:  nl  Speech:  Clear and Coherent  Volume:  Decreased  Mood:  Euthymic  Affect:  Appropriate and Congruent  Thought Process:  Coherent, Linear and Descriptions of Associations: Intact  Orientation:  Full (Time, Place, and Person)  Thought Content:  Logical and Rumination  Suicidal Thoughts:  No  Homicidal Thoughts:  No  Memory:  Immediate;   Fair  Judgement:  Fair  Insight:  Fair  Psychomotor Activity:  Normal  Concentration:  Concentration: Fair  Recall:  Good  Fund of Knowledge:  Good  Language:  Good  Akathisia:  Negative  Handed:  Right  AIMS (if indicated):     Assets:  Communication Skills Desire for Improvement  ADL's:  Intact  Cognition:  WNL  Sleep:  Number of Hours: 5.5     Treatment Plan Summary: Daily contact with patient to assess and evaluate symptoms and progress in treatment, Medication management and Plan Continue cognitive therapy current precautions and meds do not need changing probable discharge tomorrow  Johnn Hai, MD 06/17/2019, 8:33 AM

## 2019-06-18 MED ORDER — FLUOXETINE HCL 40 MG PO CAPS: 80.0000 mg | ORAL_CAPSULE | Freq: Every day | ORAL | 1 refills | Status: AC

## 2019-06-18 MED ORDER — GABAPENTIN 300 MG PO CAPS: 300.0000 mg | ORAL_CAPSULE | Freq: Two times a day (BID) | ORAL | 1 refills | Status: DC

## 2019-06-18 MED ORDER — ALBUTEROL SULFATE HFA 108 (90 BASE) MCG/ACT IN AERS: 2.0000 | INHALATION_SPRAY | Freq: Four times a day (QID) | RESPIRATORY_TRACT | 0 refills | Status: DC | PRN

## 2019-06-18 MED ORDER — TRAZODONE HCL 100 MG PO TABS: 200.0000 mg | ORAL_TABLET | Freq: Every day | ORAL | 1 refills | Status: DC

## 2019-06-18 NOTE — B[facility] Group Notes (Signed)
LCSW Group Therapy Note   06/18/2019 1:00 PM  Type of Therapy and Topic:  Group Therapy:  Overcoming Obstacles   Participation Level:  Did Not Attend   Description of Group:    In this group patients will be encouraged to explore what they see as obstacles to their own wellness and recovery. They will be guided to discuss their thoughts, feelings, and behaviors related to these obstacles. The group will process together ways to cope with barriers, with attention given to specific choices patients can make. Each patient will be challenged to identify changes they are motivated to make in order to overcome their obstacles. This group will be process-oriented, with patients participating in exploration of their own experiences as well as giving and receiving support and challenge from other group members.   Therapeutic Goals: 1. Patient will identify personal and current obstacles as they relate to admission. 2. Patient will identify barriers that currently interfere with their wellness or overcoming obstacles.  3. Patient will identify feelings, thought process and behaviors related to these barriers. 4. Patient will identify two changes they are willing to make to overcome these obstacles:      Summary of Patient Progress X   Therapeutic Modalities:   Cognitive Behavioral Therapy Solution Focused Therapy Motivational Interviewing Relapse Prevention Therapy  Lorrin Nawrot, MSW, LCSW 06/18/2019 11:12 AM    

## 2019-06-18 NOTE — Discharge Summary (Signed)
Physician Discharge Summary Note  Patient:  Betty Gomez is an 33 y.o., female MRN:  [id number] DOB:  1986-07-22 Patient phone:  [phone] (home)  Patient address:   [place] [address],  Total Time spent with patient: 45 minutes  Date of Admission:  06/14/2019 Date of Discharge: June 18, 2019  Reason for Admission: Admitted through the emergency room where she presented with subjective suicidal ideation emotional distress worry about her relapse and poor support  Principal Problem: MDD (major depressive disorder), severe ([place]) Discharge Diagnoses: Principal Problem:   MDD (major depressive disorder), severe ([place]) Active Problems:   Benzodiazepine abuse ([place])   Alcohol abuse   Opioid abuse ([place])   Past Psychiatric History: History of substance abuse repeated attempts at rehab and outpatient treatment with extended difficulty maintaining sobriety.  History of anxiety and depression with some response to medication when sober.  Past self injury.  Past Medical History:  Past Medical History:  Diagnosis Date  . Anxiety   . Asthma   . Depression    ok, came off meds, has someone she sees for this.  . Endometriosis   . Infection    UTI    Past Surgical History:  Procedure Laterality Date  . DIAGNOSTIC LAPAROSCOPY     endometriosis  . TUBAL LIGATION N/A 09/17/2015   Procedure: POST PARTUM TUBAL LIGATION;  Surgeon: Malachy Mood, MD;  Location: [facility] ORS;  Service: Gynecology;  Laterality: N/A;  . TUBAL LIGATION Bilateral 09/21/2016   Procedure: POST PARTUM TUBAL LIGATION;  Surgeon: Gae Dry, MD;  Location: [facility] ORS;  Service: Gynecology;  Laterality: Bilateral;   Family History:  Family History  Problem Relation Age of Onset  . Hypertension Mother   . Diabetes Mother   . Heart disease Mother 32       failure  . Heart failure Mother   . Depression Father   . Heart disease Maternal Grandfather   . Diabetes Paternal Grandmother   .  Hypertension Paternal Grandmother   . Cancer Paternal Grandfather        lung  . Hearing loss Neg Hx    Family Psychiatric  History: None reported Social History:  Social History   Substance and Sexual Activity  Alcohol Use No     Social History   Substance and Sexual Activity  Drug Use Yes  . Types: Marijuana, Methamphetamines, Heroin   Comment: heroin    Social History   Socioeconomic History  . Marital status: Married    Spouse name: Not on file  . Number of children: Not on file  . Years of education: Not on file  . Highest education level: Not on file  Occupational History  . Not on file  Social Needs  . Financial resource strain: Not on file  . Food insecurity    Worry: Not on file    Inability: Not on file  . Transportation needs    Medical: Not on file    Non-medical: Not on file  Tobacco Use  . Smoking status: Heavy Tobacco Smoker    Packs/day: 0.50    Years: 10.00    Pack years: 5.00    Types: Cigarettes  . Smokeless tobacco: Never Used  Substance and Sexual Activity  . Alcohol use: No  . Drug use: Yes    Types: Marijuana, Methamphetamines, Heroin    Comment: heroin  . Sexual activity: Yes  Lifestyle  . Physical activity    Days per week: Not on file  Minutes per session: Not on file  . Stress: Not on file  Relationships  . Social Herbalist on phone: Not on file    Gets together: Not on file    Attends religious service: Not on file    Active member of club or organization: Not on file    Attends meetings of clubs or organizations: Not on file    Relationship status: Not on file  Other Topics Concern  . Not on file  Social History Narrative  . Not on file    Hospital Course: Patient admitted to the psychiatric ward.  15-minute checks employed.  Patient showed no dangerous aggressive or suicidal behavior.  She was compliant and agreeable with treatment with individual and group counseling.  Stated she wanted to get back on her  medicine for her anxiety and depression.  Was restarted on fluoxetine trazodone and gabapentin as she had been taking previously.  Tolerated these medicines without difficulty.  Patient declined the offer of inpatient rehab and is going to be staying with a family member and following up with [facility].  No suicidal ideation expressed at discharge  Physical Findings: AIMS:  , ,  ,  ,    CIWA:    COWS:     Musculoskeletal: Strength & Muscle Tone: within normal limits Gait & Station: normal Patient leans: N/A  Psychiatric Specialty Exam: Physical Exam  Nursing note and vitals reviewed. Constitutional: She appears well-developed and well-nourished.  HENT:  Head: Normocephalic and atraumatic.  Eyes: Pupils are equal, round, and reactive to light. Conjunctivae are normal.  Neck: Normal range of motion.  Cardiovascular: Regular rhythm and normal heart sounds.  Respiratory: Effort normal. No respiratory distress.  GI: Soft.  Musculoskeletal: Normal range of motion.  Neurological: She is alert.  Skin: Skin is warm and dry.  Psychiatric: She has a normal mood and affect. Her behavior is normal. Judgment and thought content normal.    Review of Systems  Constitutional: Negative.   HENT: Negative.   Eyes: Negative.   Respiratory: Negative.   Cardiovascular: Negative.   Gastrointestinal: Negative.   Musculoskeletal: Negative.   Skin: Negative.   Neurological: Negative.   Psychiatric/Behavioral: Negative.     Blood pressure 107/82, pulse 75, temperature (!) 97.5 F (36.4 C), temperature source Oral, resp. rate 18, height 5' 6"  (1.676 m), weight 73.9 kg, last menstrual period 06/13/2019, SpO2 98 %, unknown if currently breastfeeding.Body mass index is 26.31 kg/m.  General Appearance: Casual  Eye Contact:  Good  Speech:  Clear and Coherent  Volume:  Normal  Mood:  Euthymic  Affect:  Congruent  Thought Process:  Goal Directed  Orientation:  Full (Time, Place, and Person)  Thought  Content:  Logical  Suicidal Thoughts:  No  Homicidal Thoughts:  No  Memory:  Immediate;   Fair Recent;   Fair Remote;   Fair  Judgement:  Fair  Insight:  Fair  Psychomotor Activity:  Normal  Concentration:  Concentration: Fair  Recall:  [employer] of Knowledge:  Fair  Language:  Fair  Akathisia:  No  Handed:  Right  AIMS (if indicated):     Assets:  Desire for Improvement Housing Physical Health Resilience Social Support  ADL's:  Intact  Cognition:  WNL  Sleep:  Number of Hours: 7.25     Have you used any form of tobacco in the last 30 days? (Cigarettes, Smokeless Tobacco, Cigars, and/or Pipes): Yes  Has this patient used any form of  tobacco in the last 30 days? (Cigarettes, Smokeless Tobacco, Cigars, and/or Pipes) Yes, Yes, A prescription for an FDA-approved tobacco cessation medication was offered at discharge and the patient refused  Blood Alcohol level:  Lab Results  Component Value Date   [facility] <10 06/13/2019   ETH <10 13/07/6577    Metabolic Disorder Labs:  Lab Results  Component Value Date   HGBA1C 5.0 11/16/2018   MPG 96.8 11/16/2018   No results found for: PROLACTIN Lab Results  Component Value Date   CHOL 108 11/16/2018   TRIG 54 11/16/2018   HDL 52 11/16/2018   CHOLHDL 2.1 11/16/2018   VLDL 11 11/16/2018   LDLCALC 45 11/16/2018    See Psychiatric Specialty Exam and Suicide Risk Assessment completed by Attending Physician prior to discharge.  Discharge destination:  Home  Is patient on multiple antipsychotic therapies at discharge:  No   Has Patient had three or more failed trials of antipsychotic monotherapy by history:  No  Recommended Plan for Multiple Antipsychotic Therapies: NA  Discharge Instructions    Diet - low sodium heart healthy   Complete by: As directed    Increase activity slowly   Complete by: As directed      Allergies as of 06/18/2019   No Known Allergies     Medication List    STOP taking these medications   Qvar  RediHaler 40 MCG/ACT inhaler Generic drug: beclomethasone     TAKE these medications     Indication  albuterol 108 (90 Base) MCG/ACT inhaler Commonly known as: VENTOLIN HFA Inhale 2 puffs into the lungs every 6 (six) hours as needed for wheezing or shortness of breath.  Indication: Asthma   FLUoxetine 40 MG capsule Commonly known as: PROZAC Take 2 capsules (80 mg total) by mouth daily. Start taking on: June 19, 2019  Indication: Depression   gabapentin 300 MG capsule Commonly known as: NEURONTIN Take 1 capsule (300 mg total) by mouth 2 (two) times daily.  Indication: Fibromyalgia Syndrome   naloxone 4 MG/0.1ML Liqd nasal spray kit Commonly known as: NARCAN One spray for overdose  Indication: Opioid Overdose   traZODone 100 MG tablet Commonly known as: DESYREL Take 2 tablets (200 mg total) by mouth at bedtime.  Indication: Trouble Sleeping      Follow-up Information    [facility] Follow up.   Contact information: [address] [place] [address] [phone]           Follow-up recommendations:  Activity:  Activity as tolerated Diet:  Regular diet Other:  Follow-up with [facility] and with treatment as mandated by drug court  Comments: Patient will be staying with a family member.  She is very aware of the consequences of violating her agreement and plans to get in contact with her probation officer as soon as possible and follow-up with drug court.  Prescriptions provided at discharge from medicine as noted above.  Signed: Alethia Berthold, MD 06/18/2019, 9:10 AM

## 2019-06-18 NOTE — Progress Notes (Signed)
Recreation Therapy Notes   Date: 06/18/2019  Time: 9:30 am   Location: Craft room   Behavioral response: N/A   Intervention Topic: Self-care  Discussion/Intervention: Patient did not attend group.   Clinical Observations/Feedback:  Patient did not attend group.   Ronnisha Felber LRT/CTRS          Dante Cooter 06/18/2019 10:36 AM

## 2019-06-18 NOTE — BHH Suicide Risk Assessment (Signed)
[place] INPATIENT:  Family/Significant Other Suicide Prevention Education  Suicide Prevention Education:  Education Completed; Officer Charisse March, probation officer [phone] has been identified by the patient as the family member/significant other with whom the patient will be residing, and identified as the person(s) who will aid the patient in the event of a mental health crisis (suicidal ideations/suicide attempt).  With written consent from the patient, the family member/significant other has been provided the following suicide prevention education, prior to the and/or following the discharge of the patient.  The suicide prevention education provided includes the following:  Suicide risk factors  Suicide prevention and interventions  National Suicide Hotline telephone number  [facility] assessment telephone number  [facility] Emergency Assistance [place] and/or Residential Mobile Crisis Unit telephone number  Request made of family/significant other to:  Remove weapons (e.g., guns, rifles, knives), all items previously/currently identified as safety concern.    Remove drugs/medications (over-the-counter, prescriptions, illicit drugs), all items previously/currently identified as a safety concern.  The family member/significant other verbalizes understanding of the suicide prevention education information provided.  The family member/significant other agrees to remove the items of safety concern listed above.  CSW spoke with pts probation officer who reported she would guess pt is in the hospital due to substance use and not taking her mental health medications.. Officer Charisse March denied knowledge of guns/weapons in the home and reported she has no SI/HI concerns for pt.   [place] MSW LCSW 06/18/2019, 9:22 AM

## 2019-06-18 NOTE — Progress Notes (Addendum)
  [facility] Adult Case Management Discharge Plan :  Will you be returning to the same living situation after discharge:  Yes,  home At discharge, do you have transportation home?: Yes,  will call ride Do you have the ability to pay for your medications: Yes,  medicaid  Release of information consent forms completed and in the chart;   Patient to Follow up at: Follow-up Information    [facility] Follow up.   Why: You have an appointment on Friday 06/22/19 at 2:30PM. It will be in person, so please go to [facility] and bring your discharge paperwork and list of medications with you. Thank You! Contact information: [address] [place] [address] [phone]           Next level of care provider has access to [place] and Suicide Prevention discussed: Yes,  SPE completed with pts probation officer  Have you used any form of tobacco in the last 30 days? (Cigarettes, Smokeless Tobacco, Cigars, and/or Pipes): Yes  Has patient been referred to the Quitline?: Patient refused referral  Patient has been referred for addiction treatment: Pt. refused referral  Delfin Edis, LCSW 06/18/2019, 10:47 AM

## 2019-06-18 NOTE — Progress Notes (Signed)
Pt received belongings, cash from safe, AVS, suicide risk assessment, transition record and prescriptions. Pt denies SI , HI and AVS. Pt was taken by taxt to her grandmother's house. Collier Bullock RN

## 2019-06-18 NOTE — Plan of Care (Signed)
Pt rates depression and anxiety both 4/10. Pt denies SI, HI and AVH. Pt was educated on care plan and verbalizes understanding. Pt will be leaving today. Collier Bullock RN Problem: Education: Goal: Knowledge of [place] [facility] Education information/materials will improve Outcome: Adequate for Discharge   Problem: Coping: Goal: Ability to verbalize frustrations and anger appropriately will improve Outcome: Adequate for Discharge Goal: Ability to demonstrate self-control will improve Outcome: Adequate for Discharge   Problem: Safety: Goal: Periods of time without injury will increase Outcome: Adequate for Discharge   Problem: Education: Goal: Utilization of techniques to improve thought processes will improve Outcome: Adequate for Discharge Goal: Knowledge of the prescribed therapeutic regimen will improve Outcome: Adequate for Discharge   Problem: Medication: Goal: Compliance with prescribed medication regimen will improve Outcome: Adequate for Discharge   Problem: Self-Concept: Goal: Will verbalize positive feelings about self Outcome: Adequate for Discharge   Problem: Education: Goal: Knowledge of disease or condition will improve Outcome: Adequate for Discharge

## 2019-06-18 NOTE — Plan of Care (Signed)
  Problem: Education: Goal: Knowledge of [place] Education information/materials will improve Outcome: Progressing  D: Patient has been calm and cooperative. Denies SI, HI and AVH. Medication compliant. Went outside for recreation.  A: Continue to monitor for safety. R: Safety maintained.

## 2019-06-18 NOTE — Progress Notes (Signed)
Recreation Therapy Notes  INPATIENT RECREATION TR PLAN  Patient Details Name: Shaleka Brines [facility] MRN: [id number] DOB: 03/07/1986 Today's Date: 06/18/2019  Rec Therapy Plan Is patient appropriate for Therapeutic Recreation?: Yes Treatment times per week: at least 3 Estimated Length of Stay: 5-7 days TR Treatment/Interventions: Group participation (Comment)  Discharge Criteria Pt will be discharged from therapy if:: Discharged Treatment plan/goals/alternatives discussed and agreed upon by:: Patient/family  Discharge Summary Short term goals set: Patient will engage in groups without prompting or encouragement from LRT x3 group sessions within 5 recreation therapy group sessions Short term goals met: Not met Reason goals not met: Patient did not attend any groups Therapeutic equipment acquired: N/A Reason patient discharged from therapy: Discharge from hospital Pt/family agrees with progress & goals achieved: Yes Date patient discharged from therapy: 06/18/19   Chason Mciver 06/18/2019, 11:40 AM

## 2019-06-18 NOTE — BHH Suicide Risk Assessment (Signed)
[place] INPATIENT:  Family/Significant Other Suicide Prevention Education  Suicide Prevention Education:  Contact Attempts: Officer Charisse March, probation officer [phone] has been identified by the patient as the family member/significant other with whom the patient will be residing, and identified as the person(s) who will aid the patient in the event of a mental health crisis.  With written consent from the patient, two attempts were made to provide suicide prevention education, prior to and/or following the patient's discharge.  We were unsuccessful in providing suicide prevention education.  A suicide education pamphlet was given to the patient to share with family/significant other.  Date and time of first attempt: 06/15/19 at 9:44AM Date and time of second attempt: 06/18/19 at 9:10AM   CSW left a HIPAA compliant voicemail requesting a call back.  [place] MSW LCSW 06/18/2019, 9:11 AM

## 2019-06-18 NOTE — Plan of Care (Signed)
  Problem: Education: Goal: Knowledge of [facility] Education information/materials will improve 06/18/2019 1042 by Kieth Brightly, RN Outcome: Adequate for Discharge 06/18/2019 6387 by Kieth Brightly, RN Outcome: Adequate for Discharge   Problem: Coping: Goal: Ability to verbalize frustrations and anger appropriately will improve 06/18/2019 1042 by Kieth Brightly, RN Outcome: Adequate for Discharge 06/18/2019 5643 by Kieth Brightly, RN Outcome: Adequate for Discharge Goal: Ability to demonstrate self-control will improve 06/18/2019 1042 by Kieth Brightly, RN Outcome: Adequate for Discharge 06/18/2019 3295 by Kieth Brightly, RN Outcome: Adequate for Discharge   Problem: Safety: Goal: Periods of time without injury will increase 06/18/2019 1042 by Kieth Brightly, RN Outcome: Adequate for Discharge 06/18/2019 0924 by Kieth Brightly, RN Outcome: Adequate for Discharge   Problem: Education: Goal: Utilization of techniques to improve thought processes will improve 06/18/2019 1042 by Kieth Brightly, RN Outcome: Adequate for Discharge 06/18/2019 1884 by Kieth Brightly, RN Outcome: Adequate for Discharge Goal: Knowledge of the prescribed therapeutic regimen will improve 06/18/2019 1042 by Kieth Brightly, RN Outcome: Adequate for Discharge 06/18/2019 1660 by Kieth Brightly, RN Outcome: Adequate for Discharge   Problem: Medication: Goal: Compliance with prescribed medication regimen will improve 06/18/2019 1042 by Kieth Brightly, RN Outcome: Adequate for Discharge 06/18/2019 0924 by Kieth Brightly, RN Outcome: Adequate for Discharge   Problem: Self-Concept: Goal: Will verbalize positive feelings about self 06/18/2019 1042 by Kieth Brightly, RN Outcome: Adequate for Discharge 06/18/2019 0924 by Kieth Brightly, RN Outcome: Adequate for Discharge   Problem: Education: Goal: Knowledge of disease or condition will improve 06/18/2019 1042 by Kieth Brightly, RN Outcome:  Adequate for Discharge 06/18/2019 0924 by Kieth Brightly, RN Outcome: Adequate for Discharge

## 2019-06-18 NOTE — BHH Suicide Risk Assessment (Signed)
[facility] Discharge Suicide Risk Assessment   Principal Problem: MDD (major depressive disorder), severe ([place]) Discharge Diagnoses: Principal Problem:   MDD (major depressive disorder), severe ([place]) Active Problems:   Benzodiazepine abuse ([place])   Alcohol abuse   Opioid abuse ([place])   Total Time spent with patient: 45 minutes  Musculoskeletal: Strength & Muscle Tone: within normal limits Gait & Station: normal Patient leans: N/A  Psychiatric Specialty Exam: Review of Systems  Constitutional: Negative.   HENT: Negative.   Eyes: Negative.   Respiratory: Negative.   Cardiovascular: Negative.   Gastrointestinal: Negative.   Musculoskeletal: Negative.   Skin: Negative.   Neurological: Negative.   Psychiatric/Behavioral: Negative.     Blood pressure 107/82, pulse 75, temperature (!) 97.5 F (36.4 C), temperature source Oral, resp. rate 18, height 5\' 6"  (1.676 m), weight 73.9 kg, last menstrual period 06/13/2019, SpO2 98 %, unknown if currently breastfeeding.Body mass index is 26.31 kg/m.  General Appearance: Casual  Eye Contact::  Good  Speech:  Clear and [id number]  Volume:  Normal  Mood:  Euthymic  Affect:  Constricted  Thought Process:  Goal Directed  Orientation:  Full (Time, Place, and Person)  Thought Content:  Logical  Suicidal Thoughts:  No  Homicidal Thoughts:  No  Memory:  Immediate;   Fair Recent;   Fair Remote;   Fair  Judgement:  Fair  Insight:  Fair  Psychomotor Activity:  Normal  Concentration:  Fair  Recall:  [employer] of Knowledge:Fair  Language: Fair  Akathisia:  No  Handed:  Right  AIMS (if indicated):     Assets:  Desire for Improvement Physical Health Social Support  Sleep:  Number of Hours: 7.25  Cognition: WNL  ADL's:  Intact   Mental Status Per Nursing Assessment::   On Admission:  NA  Demographic Factors:  Caucasian and Low socioeconomic status  Loss Factors: Legal issues  Historical Factors: Prior suicide attempts and  Impulsivity  Risk Reduction Factors:   Sense of responsibility to family, Religious beliefs about death, Living with another person, especially a relative, Positive social support and Positive therapeutic relationship  Continued Clinical Symptoms:  Depression:   Comorbid alcohol abuse/dependence Alcohol/Substance Abuse/Dependencies  Cognitive Features That Contribute To Risk:  None    Suicide Risk:  Minimal: No identifiable suicidal ideation.  Patients presenting with no risk factors but with morbid ruminations; may be classified as minimal risk based on the severity of the depressive symptoms  Follow-up Information    [facility] Follow up.   Contact information: [address] [place] [address] [phone]           Plan Of Care/Follow-up recommendations:  Activity:  Activity as tolerated Diet:  Regular diet Other:  Follow-up with Johny Shock, MD 06/18/2019, 9:05 AM

## 2019-06-18 NOTE — Plan of Care (Signed)
Pt adequate for dc.  Problem: Education: Goal: Knowledge of  General Education information/materials will improve 06/18/2019 1336 by Kieth Brightly, RN Outcome: Adequate for Discharge 06/18/2019 1042 by Kieth Brightly, RN Outcome: Adequate for Discharge 06/18/2019 9509 by Kieth Brightly, RN Outcome: Adequate for Discharge   Problem: Coping: Goal: Ability to verbalize frustrations and anger appropriately will improve 06/18/2019 1336 by Kieth Brightly, RN Outcome: Adequate for Discharge 06/18/2019 1042 by Kieth Brightly, RN Outcome: Adequate for Discharge 06/18/2019 3267 by Kieth Brightly, RN Outcome: Adequate for Discharge Goal: Ability to demonstrate self-control will improve 06/18/2019 1336 by Kieth Brightly, RN Outcome: Adequate for Discharge 06/18/2019 1042 by Kieth Brightly, RN Outcome: Adequate for Discharge 06/18/2019 1245 by Kieth Brightly, RN Outcome: Adequate for Discharge   Problem: Coping: Goal: Ability to demonstrate self-control will improve 06/18/2019 1336 by Kieth Brightly, RN Outcome: Adequate for Discharge 06/18/2019 1042 by Kieth Brightly, RN Outcome: Adequate for Discharge 06/18/2019 8099 by Kieth Brightly, RN Outcome: Adequate for Discharge   Problem: Safety: Goal: Periods of time without injury will increase 06/18/2019 1336 by Kieth Brightly, RN Outcome: Adequate for Discharge 06/18/2019 1042 by Kieth Brightly, RN Outcome: Adequate for Discharge 06/18/2019 0924 by Kieth Brightly, RN Outcome: Adequate for Discharge   Problem: Education: Goal: Utilization of techniques to improve thought processes will improve 06/18/2019 1336 by Kieth Brightly, RN Outcome: Adequate for Discharge 06/18/2019 1042 by Kieth Brightly, RN Outcome: Adequate for Discharge 06/18/2019 8338 by Kieth Brightly, RN Outcome: Adequate for Discharge Goal: Knowledge of the prescribed therapeutic regimen will improve 06/18/2019 1336 by Kieth Brightly, RN Outcome:  Adequate for Discharge 06/18/2019 1042 by Kieth Brightly, RN Outcome: Adequate for Discharge 06/18/2019 2505 by Kieth Brightly, RN Outcome: Adequate for Discharge   Problem: Medication: Goal: Compliance with prescribed medication regimen will improve 06/18/2019 1336 by Kieth Brightly, RN Outcome: Adequate for Discharge 06/18/2019 1042 by Kieth Brightly, RN Outcome: Adequate for Discharge 06/18/2019 0924 by Kieth Brightly, RN Outcome: Adequate for Discharge   Problem: Self-Concept: Goal: Will verbalize positive feelings about self 06/18/2019 1336 by Kieth Brightly, RN Outcome: Adequate for Discharge 06/18/2019 1042 by Kieth Brightly, RN Outcome: Adequate for Discharge 06/18/2019 3976 by Kieth Brightly, RN Outcome: Adequate for Discharge   Problem: Education: Goal: Knowledge of disease or condition will improve 06/18/2019 1336 by Kieth Brightly, RN Outcome: Adequate for Discharge 06/18/2019 1042 by Kieth Brightly, RN Outcome: Adequate for Discharge 06/18/2019 0924 by Kieth Brightly, RN Outcome: Adequate for Discharge

## 2019-06-18 NOTE — Progress Notes (Signed)
D: Patient has been calm and cooperative. Denies SI, HI and AVH. Medication compliant. Went outside for recreation.  A: Continue to monitor for safety. R: Safety maintained.

## 2019-12-01 ENCOUNTER — Encounter: Payer: Self-pay | Admitting: Emergency Medicine

## 2019-12-01 ENCOUNTER — Emergency Department
Admission: EM | Admit: 2019-12-01 | Discharge: 2019-12-01 | Disposition: A | Discharge status: Home / Self Care | Payer: Medicaid Other | Attending: Emergency Medicine | Admitting: Emergency Medicine

## 2019-12-01 ENCOUNTER — Other Ambulatory Visit: Payer: Self-pay

## 2019-12-01 DIAGNOSIS — F1110 Opioid abuse, uncomplicated: Secondary | ICD-10-CM | POA: Insufficient documentation

## 2019-12-01 DIAGNOSIS — F1210 Cannabis abuse, uncomplicated: Secondary | ICD-10-CM | POA: Diagnosis not present

## 2019-12-01 DIAGNOSIS — H1033 Unspecified acute conjunctivitis, bilateral: Secondary | ICD-10-CM | POA: Insufficient documentation

## 2019-12-01 DIAGNOSIS — J45909 Unspecified asthma, uncomplicated: Secondary | ICD-10-CM | POA: Insufficient documentation

## 2019-12-01 DIAGNOSIS — F17210 Nicotine dependence, cigarettes, uncomplicated: Secondary | ICD-10-CM | POA: Diagnosis not present

## 2019-12-01 DIAGNOSIS — H5713 Ocular pain, bilateral: Secondary | ICD-10-CM | POA: Diagnosis present

## 2019-12-01 MED ORDER — TETRACAINE HCL 0.5 % OP SOLN
2.0000 [drp] | Freq: Once | OPHTHALMIC | Status: AC
  Administered 2019-12-01: 17:00:00 2 [drp] via OPHTHALMIC
  Filled 2019-12-01: qty 4

## 2019-12-01 MED ORDER — KETOROLAC TROMETHAMINE 0.5 % OP SOLN: 1.0000 [drp] | Freq: Four times a day (QID) | OPHTHALMIC | 0 refills | Status: AC

## 2019-12-01 MED ORDER — POLYMYXIN B-TRIMETHOPRIM 10000-0.1 UNIT/ML-% OP SOLN: 2.0000 [drp] | OPHTHALMIC | 0 refills | Status: AC

## 2019-12-01 MED ORDER — FLUORESCEIN SODIUM 1 MG OP STRP
1.0000 | ORAL_STRIP | Freq: Once | OPHTHALMIC | Status: AC
  Administered 2019-12-01: 1 via OPHTHALMIC
  Filled 2019-12-01: qty 1

## 2019-12-01 MED ORDER — ERYTHROMYCIN 5 MG/GM OP OINT: 1.0000 "application " | TOPICAL_OINTMENT | Freq: Four times a day (QID) | OPHTHALMIC | Status: DC

## 2019-12-01 NOTE — ED Triage Notes (Signed)
Pt from home via POV. Pt st BI eye pain for 1x week, yellow drainage, irritation "feels like something is in my eye and burning". Pt st trying "7 different eye drops" OTC w/o relief. Redness noted upon assessment.

## 2019-12-01 NOTE — Discharge Instructions (Signed)
Use the eye drops as directed. Follow-up with your provider or return as needed.

## 2019-12-01 NOTE — ED Provider Notes (Signed)
[facility] Emergency Department Provider Note ____________________________________________  Time seen: 1702  I have reviewed the triage vital signs and the nursing notes.  HISTORY  Chief Complaint  Eye Pain   HPI Betty Gomez is a 33 y.o. female presents to the ED for evaluation of a 1 week complaint of bilateral eye pain and irritation.   Patient reports about a gritty foreign body sensation to the eyes as well as some burning.  She is also noted purulent discharge from the eyes.  She is denied any relief with the use of over-the-counter eyedrops.  He denies any nausea, headache, vision loss, or eye trauma.  She does not wear corrective lenses.  Past Medical History:  Diagnosis Date  . Anxiety   . Asthma   . Depression    ok, came off meds, has someone she sees for this.  . Endometriosis   . Infection    UTI    Patient Active Problem List   Diagnosis Date Noted  . MDD (major depressive disorder), severe ([place]) 06/14/2019  . Drug overdose, intentional, initial encounter ([place]) 06/10/2019  . Severe recurrent major depression without psychotic features ([place]) 11/17/2018  . Bipolar disorder, unspecified ([place]) 11/17/2018  . Substance induced mood disorder ([place]) 11/17/2018  . Cannabis abuse 11/17/2018  . Methamphetamine abuse ([place]) 11/17/2018  . Opioid abuse ([place]) 11/17/2018  . Tobacco abuse 11/17/2018  . Polysubstance abuse ([place]) 11/17/2018  . Suicidal ideation 11/16/2018  . Depression with suicidal ideation 11/16/2018  . Alcohol abuse 11/16/2018  . 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 Benzodiazepine abuse ([place]) 09/21/2017  . Normal labor 09/20/2016  . Labor and delivery, indication for care 09/08/2016  . Decreased fetal movement 07/24/2016  . Pregnancy 09/15/2015  . Insufficient prenatal care 09/15/2015  . Indication for care in labor or delivery 09/10/2015  . Braxton Hicks contractions 09/08/2015   . Candida albicans infection 08/16/2015  . Abnormal antenatal AFP screen 05/24/2015  . Irregular contractions 05/24/2015    Past Surgical History:  Procedure Laterality Date  . DIAGNOSTIC LAPAROSCOPY     endometriosis  . TUBAL LIGATION N/A 09/17/2015   Procedure: POST PARTUM TUBAL LIGATION;  Surgeon: Malachy Mood, MD;  Location: [facility] ORS;  Service: Gynecology;  Laterality: N/A;  . TUBAL LIGATION Bilateral 09/21/2016   Procedure: POST PARTUM TUBAL LIGATION;  Surgeon: Gae Dry, MD;  Location: [facility] ORS;  Service: Gynecology;  Laterality: Bilateral;    Prior to Admission medications   Medication Sig Start Date End Date Taking? Authorizing Provider  albuterol (VENTOLIN HFA) 108 (90 Base) MCG/ACT inhaler Inhale 2 puffs into the lungs every 6 (six) hours as needed for wheezing or shortness of breath. 06/18/19   Clapacs, Madie Reno, MD  FLUoxetine (PROZAC) 40 MG capsule Take 2 capsules (80 mg total) by mouth daily. 06/19/19   Clapacs, Madie Reno, MD  gabapentin (NEURONTIN) 300 MG capsule Take 1 capsule (300 mg total) by mouth 2 (two) times daily. 06/18/19   Clapacs, Madie Reno, MD  ketorolac (ACULAR) 0.5 % ophthalmic solution Place 1 drop into both eyes 4 (four) times daily. 12/01/19   Bobbyjo Marulanda, Dannielle Karvonen, PA-C  naloxone [facility]) nasal spray 4 mg/0.1 mL One spray for overdose 06/12/19   Loletha Grayer, MD  traZODone (DESYREL) 100 MG tablet Take 2 tablets (200 mg total) by mouth at bedtime. 06/18/19   Clapacs, Madie Reno, MD  trimethoprim-polymyxin b (POLYTRIM) ophthalmic solution Place 2 drops into both eyes every 4 (  four) hours for 7 days. 12/01/19 12/08/19  Jairus Tonne, Charlesetta IvoryJenise V Bacon, PA-C    Allergies Patient has no known allergies.  Family History  Problem Relation Age of Onset  . Hypertension Mother   . Diabetes Mother   . Heart disease Mother 4034       failure  . Heart failure Mother   . Depression Father   . Heart disease Maternal Grandfather   . Diabetes Paternal Grandmother   .  Hypertension Paternal Grandmother   . Cancer Paternal Grandfather        lung  . Hearing loss Neg Hx     Social History Social History   Tobacco Use  . Smoking status: Heavy Tobacco Smoker    Packs/day: 0.50    Years: 10.00    Pack years: 5.00    Types: Cigarettes  . Smokeless tobacco: Never Used  Substance Use Topics  . Alcohol use: No  . Drug use: Yes    Types: Marijuana, Methamphetamines, Heroin    Comment: heroin    Review of Systems  Constitutional: Negative for fever. Eyes: Negative for visual changes.  Reports eye irritation and purulent drainage. ENT: Negative for sore throat. Cardiovascular: Negative for chest pain. Respiratory: Negative for shortness of breath. Gastrointestinal: Negative for abdominal pain, vomiting and diarrhea. Skin: Negative for rash. Neurological: Negative for headaches, focal weakness or numbness. ____________________________________________  PHYSICAL EXAM:  VITAL SIGNS: ED Triage Vitals  Enc Vitals Group     BP --      Pulse --      Resp --      Temp --      Temp src --      SpO2 --      Weight 12/01/19 1641 178 lb (80.7 kg)     Height 12/01/19 1641 5\' 5"  (1.651 m)     Head Circumference --      Peak Flow --      Pain Score 12/01/19 1640 10     Pain Loc --      Pain Edu? --      Excl. in GC? --     Constitutional: Alert and oriented. Well appearing and in no distress. Head: Normocephalic and atraumatic. Eyes: Conjunctivae are injected. PERRL. Normal extraocular movements. Inflamed palpebral conjunctiva. No fluorescein dye uptake.  Hematological/Lymphatic/Immunological: No preauricular lymphadenopathy. Cardiovascular: Normal rate, regular rhythm. Normal distal pulses. Respiratory: Normal respiratory effort. No wheezes/rales/rhonchi. Gastrointestinal: Soft and nontender. No distention. Musculoskeletal: Nontender with normal range of motion in all extremities.  Neurologic:  Normal gait without ataxia. Normal speech and  language. No gross focal neurologic deficits are appreciated. Skin:  Skin is warm, dry and intact. No rash noted. ____________________________________________  PROCEDURES  Tetracaine i gtt OU Procedures ____________________________________________  INITIAL IMPRESSION / ASSESSMENT AND PLAN / ED COURSE  Patient with ED evaluation of a 1 week complaint of eye irritation, redness, purulent drainage.  Patient's exam is overall benign reassuring at this time.  No fluorescein dye uptake to indicate a corneal abrasion.  No previous lymphadenopathy is noted.  No eye trauma is noted.  Exam is overall consistent with a conjunctivitis however probable bacterial etiology.  Should be treated with Polytrim solution and Acular for irritation.  She will follow-up with a primary provider or local eye care specialist.  Sanda Kleinshley Brooke Melrose was evaluated in Emergency Department on 12/01/2019 for the symptoms described in the history of present illness. She was evaluated in the context of the global COVID-19 pandemic, which necessitated consideration  that the patient might be at risk for infection with the SARS-CoV-2 virus that causes COVID-19. Institutional protocols and algorithms that pertain to the evaluation of patients at risk for COVID-19 are in a state of rapid change based on information released by regulatory bodies including the CDC and federal and state organizations. These policies and algorithms were followed during the patient's care in the ED. ____________________________________________  FINAL CLINICAL IMPRESSION(S) / ED DIAGNOSES  Final diagnoses:  Acute bacterial conjunctivitis of both eyes      Karmen Stabs, Charlesetta Ivory, PA-C 12/01/19 1914    Concha Se, MD 12/01/19 [phone]

## 2019-12-28 ENCOUNTER — Other Ambulatory Visit: Payer: Self-pay

## 2019-12-28 ENCOUNTER — Emergency Department
Admission: EM | Admit: 2019-12-28 | Discharge: 2019-12-29 | Disposition: A | Discharge status: Home / Self Care | Payer: Medicaid Other | Attending: Emergency Medicine | Admitting: Emergency Medicine

## 2019-12-28 DIAGNOSIS — F329 Major depressive disorder, single episode, unspecified: Secondary | ICD-10-CM | POA: Insufficient documentation

## 2019-12-28 DIAGNOSIS — Z5321 Procedure and treatment not carried out due to patient leaving prior to being seen by health care provider: Secondary | ICD-10-CM | POA: Diagnosis not present

## 2019-12-28 DIAGNOSIS — L089 Local infection of the skin and subcutaneous tissue, unspecified: Secondary | ICD-10-CM | POA: Insufficient documentation

## 2019-12-28 LAB — POCT PREGNANCY, URINE: Preg Test, Ur: NEGATIVE

## 2019-12-28 NOTE — ED Triage Notes (Signed)
Pt to the er for psych consult and possible infected tattoo on the right side of the neck. Pt has a hx of anxiety depression and bipolar. Pt denies any suicidal thoughts.

## 2019-12-28 NOTE — ED Notes (Signed)
Triaged done by Lynn Ito RN under ed tech [place] name.

## 2019-12-29 LAB — ETHANOL: Alcohol, Ethyl (B): <10 mg/dL (ref <10)

## 2019-12-29 LAB — COMPREHENSIVE METABOLIC PANEL
ALT: 47 U/L — ABNORMAL HIGH (ref 0–44)
AST: 34 U/L (ref 15–41)
Albumin: 3.9 g/dL (ref 3.5–5.0)
Alkaline Phosphatase: 67 U/L (ref 38–126)
Anion gap: 12 (ref 5–15)
BUN: 8 mg/dL (ref 6–20)
CO2: 26 mmol/L (ref 22–32)
Calcium: 9.4 mg/dL (ref 8.9–10.3)
Chloride: 102 mmol/L (ref 98–111)
Creatinine, Ser: 0.78 mg/dL (ref 0.44–1.00)
GFR calc Af Amer: >60 mL/min (ref >60)
GFR calc non Af Amer: >60 mL/min (ref >60)
Glucose, Bld: 113 mg/dL — ABNORMAL HIGH (ref 70–99)
Potassium: 3 mmol/L — ABNORMAL LOW (ref 3.5–5.1)
Sodium: 140 mmol/L (ref 135–145)
Total Bilirubin: 0.6 mg/dL (ref 0.3–1.2)
Total Protein: 7.7 g/dL (ref 6.5–8.1)

## 2019-12-29 LAB — URINE DRUG SCREEN, QUALITATIVE (ARMC ONLY)
Amphetamines, Ur Screen: POSITIVE — AB
Barbiturates, Ur Screen: NOT DETECTED
Benzodiazepine, Ur Scrn: NOT DETECTED
Cannabinoid 50 Ng, Ur ~~LOC~~: POSITIVE — AB
Cocaine Metabolite,Ur ~~LOC~~: NOT DETECTED
MDMA (Ecstasy)Ur Screen: NOT DETECTED
Methadone Scn, Ur: NOT DETECTED
Opiate, Ur Screen: NOT DETECTED
Phencyclidine (PCP) Ur S: NOT DETECTED
Tricyclic, Ur Screen: NOT DETECTED

## 2019-12-29 LAB — CBC
HCT: 38.7 % (ref 36.0–46.0)
Hemoglobin: 13 g/dL (ref 12.0–15.0)
MCH: 30.2 pg (ref 26.0–34.0)
MCHC: 33.6 g/dL (ref 30.0–36.0)
MCV: 90 fL (ref 80.0–100.0)
Platelets: 281 10*3/uL (ref 150–400)
RBC: 4.3 MIL/uL (ref 3.87–5.11)
RDW: 12 % (ref 11.5–15.5)
WBC: 8.9 10*3/uL (ref 4.0–10.5)
nRBC: 0 % (ref 0.0–0.2)

## 2019-12-29 NOTE — ED Notes (Signed)
Pt refusing to change into behavior clothes. Pt denies SI or HI. Pt state she just wanted to talk psych to get stuff off her chest but if she has to change clothes then she will wait for therapist tomorrow. Told pt that was fine.

## 2019-12-29 NOTE — ED Notes (Signed)
Pt called in the lobby x 3, no answer.

## 2019-12-31 ENCOUNTER — Other Ambulatory Visit: Payer: Self-pay

## 2019-12-31 ENCOUNTER — Emergency Department
Admission: EM | Admit: 2019-12-31 | Discharge: 2019-12-31 | Disposition: A | Discharge status: Home / Self Care | Payer: Medicaid Other | Attending: Emergency Medicine | Admitting: Emergency Medicine

## 2019-12-31 DIAGNOSIS — F1520 Other stimulant dependence, uncomplicated: Secondary | ICD-10-CM | POA: Diagnosis not present

## 2019-12-31 DIAGNOSIS — F17210 Nicotine dependence, cigarettes, uncomplicated: Secondary | ICD-10-CM | POA: Diagnosis not present

## 2019-12-31 DIAGNOSIS — Z79899 Other long term (current) drug therapy: Secondary | ICD-10-CM | POA: Insufficient documentation

## 2019-12-31 DIAGNOSIS — F329 Major depressive disorder, single episode, unspecified: Secondary | ICD-10-CM | POA: Insufficient documentation

## 2019-12-31 DIAGNOSIS — F1510 Other stimulant abuse, uncomplicated: Secondary | ICD-10-CM

## 2019-12-31 DIAGNOSIS — R440 Auditory hallucinations: Secondary | ICD-10-CM | POA: Diagnosis present

## 2019-12-31 LAB — COMPREHENSIVE METABOLIC PANEL
ALT: 36 U/L (ref 0–44)
AST: 26 U/L (ref 15–41)
Albumin: 4 g/dL (ref 3.5–5.0)
Alkaline Phosphatase: 69 U/L (ref 38–126)
Anion gap: 10 (ref 5–15)
BUN: 13 mg/dL (ref 6–20)
CO2: 24 mmol/L (ref 22–32)
Calcium: 9.1 mg/dL (ref 8.9–10.3)
Chloride: 105 mmol/L (ref 98–111)
Creatinine, Ser: 0.94 mg/dL (ref 0.44–1.00)
GFR calc Af Amer: >60 mL/min (ref >60)
GFR calc non Af Amer: >60 mL/min (ref >60)
Glucose, Bld: 103 mg/dL — ABNORMAL HIGH (ref 70–99)
Potassium: 3.5 mmol/L (ref 3.5–5.1)
Sodium: 139 mmol/L (ref 135–145)
Total Bilirubin: 1.2 mg/dL (ref 0.3–1.2)
Total Protein: 7.6 g/dL (ref 6.5–8.1)

## 2019-12-31 LAB — URINE DRUG SCREEN, QUALITATIVE (ARMC ONLY)
Amphetamines, Ur Screen: POSITIVE — AB
Barbiturates, Ur Screen: NOT DETECTED
Benzodiazepine, Ur Scrn: NOT DETECTED
Cannabinoid 50 Ng, Ur ~~LOC~~: POSITIVE — AB
Cocaine Metabolite,Ur ~~LOC~~: NOT DETECTED
MDMA (Ecstasy)Ur Screen: NOT DETECTED
Methadone Scn, Ur: NOT DETECTED
Opiate, Ur Screen: NOT DETECTED
Phencyclidine (PCP) Ur S: NOT DETECTED
Tricyclic, Ur Screen: NOT DETECTED

## 2019-12-31 LAB — ACETAMINOPHEN LEVEL: Acetaminophen (Tylenol), Serum: <10 ug/mL — ABNORMAL LOW (ref 10–30)

## 2019-12-31 LAB — CBC
HCT: 39.5 % (ref 36.0–46.0)
Hemoglobin: 13.3 g/dL (ref 12.0–15.0)
MCH: 30.3 pg (ref 26.0–34.0)
MCHC: 33.7 g/dL (ref 30.0–36.0)
MCV: 90 fL (ref 80.0–100.0)
Platelets: 266 10*3/uL (ref 150–400)
RBC: 4.39 MIL/uL (ref 3.87–5.11)
RDW: 12.2 % (ref 11.5–15.5)
WBC: 7.5 10*3/uL (ref 4.0–10.5)
nRBC: 0 % (ref 0.0–0.2)

## 2019-12-31 LAB — SALICYLATE LEVEL: Salicylate Lvl: <7 mg/dL — ABNORMAL LOW (ref 7.0–30.0)

## 2019-12-31 LAB — ETHANOL: Alcohol, Ethyl (B): <10 mg/dL (ref <10)

## 2019-12-31 LAB — POCT PREGNANCY, URINE: Preg Test, Ur: NEGATIVE

## 2019-12-31 MED ORDER — BACITRACIN ZINC 500 UNIT/GM EX OINT
TOPICAL_OINTMENT | Freq: Two times a day (BID) | CUTANEOUS | Status: DC
  Administered 2019-12-31: 1 via TOPICAL
  Filled 2019-12-31: qty 0.9

## 2019-12-31 NOTE — ED Notes (Signed)
TTS unable to conduct assessment at this time. Patient is unable to participate in the assessment.

## 2019-12-31 NOTE — ED Notes (Signed)
Pt to room att with PD escort; IVC'd by father, Dr Manson Passey at bedside, water and warm blanket provided  Pt reports using meth 2 hours prior to arrival, pt denies SI/HI or A/V hallucinations  Pt is poor historian given erratic reporting of sequential events

## 2019-12-31 NOTE — ED Notes (Signed)
Pt given breakfast tray

## 2019-12-31 NOTE — ED Notes (Signed)
Pt given phone 

## 2019-12-31 NOTE — ED Provider Notes (Signed)
-----------------------------------------   11:11 AM on 12/31/2019 -----------------------------------------  Blood pressure 115/85, pulse 91, temperature 98.3 F (36.8 C), temperature source Oral, resp. rate 18, height 5\' 5"  (1.651 m), weight 76.2 kg, last menstrual period 12/31/2019, SpO2 99 %, unknown if currently breastfeeding.  The patient is calm and cooperative at this time.  There have been no acute events since the last update.  Awaiting disposition plan from Behavioral Medicine team.  Patient cleared for discharge home by psychiatry, IVC rescinded.   01/02/2020, MD 12/31/19 1112

## 2019-12-31 NOTE — Consult Note (Signed)
[facility] Consult   Reason for Consult: Psychotic behavior Referring Physician: Dr. Owens Shark [name] MRN:  [id number] Principal Diagnosis: <principal problem not specified> Diagnosis:  Active Problems:   * No active hospital problems. *   Total Time spent with patient: 30 minutes  Subjective:   Betty Gomez is a 34 y.o. female patient admitted with psychotic behavior.  HPI: Patient was brought here under IVC for psychotic behavior.  ED note as follows  "Betty Gomez is a 34 y.o. female with below list of previous medical conditions presents to the emergency department voluntarily committed secondary to history of bipolar disorder not taking medication and hearing voices also reported suicidal ideation.  Patient denies any suicidal ideation.  Patient does however admit to using methamphetamine 2 hours ago."    Upon evaluation on morning of 12/31/2019 patient is calm and cooperative and denying any psychiatric symptoms.  She denies any SI or HI.  She denies any psychosis and explains her presentation is due to her recent methamphetamine use.  Patient reports that she knows better than to use methamphetamine but she did use recently as it was her birthday and she was trying to party and have a good time.  She understands that her meth use has made her appear psychotic.  Patient states that she is compliant with her outpatient medications and her outpatient psychiatric appointments.  She denies the need to stay in the hospital and reports feeling safe going home.  Past Psychiatric History: Patient reports 2 prior psychiatric hospitalizations.  She also states that she is a criminal history as well as a substance abuse history. Risk to Self:  No no Risk to Others:  No Prior Inpatient Therapy:  Yes Prior Outpatient Therapy:  Yes  Past Medical History:  Past Medical History:  Diagnosis Date  . Anxiety   . Asthma   . Depression     ok, came off meds, has someone she sees for this.  . Endometriosis   . Infection    UTI    Past Surgical History:  Procedure Laterality Date  . DIAGNOSTIC LAPAROSCOPY     endometriosis  . TUBAL LIGATION N/A 09/17/2015   Procedure: POST PARTUM TUBAL LIGATION;  Surgeon: Malachy Mood, MD;  Location: [facility] ORS;  Service: Gynecology;  Laterality: N/A;  . TUBAL LIGATION Bilateral 09/21/2016   Procedure: POST PARTUM TUBAL LIGATION;  Surgeon: Gae Dry, MD;  Location: [facility] ORS;  Service: Gynecology;  Laterality: Bilateral;   Family History:  Family History  Problem Relation Age of Onset  . Hypertension Mother   . Diabetes Mother   . Heart disease Mother 27       failure  . Heart failure Mother   . Depression Father   . Heart disease Maternal Grandfather   . Diabetes Paternal Grandmother   . Hypertension Paternal Grandmother   . Cancer Paternal Grandfather        lung  . Hearing loss Neg Hx    Family Psychiatric  History: Patient denies Social History:  Social History   Substance and Sexual Activity  Alcohol Use No     Social History   Substance and Sexual Activity  Drug Use Yes  . Types: Marijuana, Methamphetamines, Heroin   Comment: heroin    Social History   Socioeconomic History  . Marital status: Married    Spouse name: Not on file  . Number of children: Not on file  . Years of education: Not on  file  . Highest education level: Not on file  Occupational History  . Not on file  Tobacco Use  . Smoking status: Heavy Tobacco Smoker    Packs/day: 0.50    Years: 10.00    Pack years: 5.00    Types: Cigarettes  . Smokeless tobacco: Never Used  Substance and Sexual Activity  . Alcohol use: No  . Drug use: Yes    Types: Marijuana, Methamphetamines, Heroin    Comment: heroin  . Sexual activity: Yes  Other Topics Concern  . Not on file  Social History Narrative  . Not on file   Social Determinants of Health   Financial Resource Strain:   .  Difficulty of Paying Living Expenses: Not on file  Food Insecurity:   . Worried About Programme researcher, broadcasting/film/video in the Last Year: Not on file  . Ran Out of Food in the Last Year: Not on file  Transportation Needs:   . Lack of Transportation (Medical): Not on file  . Lack of Transportation (Non-Medical): Not on file  Physical Activity:   . Days of Exercise per Week: Not on file  . Minutes of Exercise per Session: Not on file  Stress:   . Feeling of Stress : Not on file  Social Connections:   . Frequency of Communication with Friends and Family: Not on file  . Frequency of Social Gatherings with Friends and Family: Not on file  . Attends Religious Services: Not on file  . Active Member of Clubs or Organizations: Not on file  . Attends Banker Meetings: Not on file  . Marital Status: Not on file   Additional Social History:    Allergies:  No Known Allergies  Labs:  Results for orders placed or performed during the hospital encounter of 12/31/19 (from the past 48 hour(s))  Comprehensive metabolic panel     Status: Abnormal   Collection Time: 12/31/19  4:27 AM  Result Value Ref Range   Sodium 139 135 - 145 mmol/L   Potassium 3.5 3.5 - 5.1 mmol/L   Chloride 105 98 - 111 mmol/L   CO2 24 22 - 32 mmol/L   Glucose, Bld 103 (H) 70 - 99 mg/dL   BUN 13 6 - 20 mg/dL   Creatinine, Ser 0.86 0.44 - 1.00 mg/dL   Calcium 9.1 8.9 - 57.8 mg/dL   Total Protein 7.6 6.5 - 8.1 g/dL   Albumin 4.0 3.5 - 5.0 g/dL   AST 26 15 - 41 U/L   ALT 36 0 - 44 U/L   Alkaline Phosphatase 69 38 - 126 U/L   Total Bilirubin 1.2 0.3 - 1.2 mg/dL   GFR calc non Af Amer >60 >60 mL/min   GFR calc Af Amer >60 >60 mL/min   Anion gap 10 5 - 15    Comment: Performed at [facility], [address], [place], [place] [address]  Ethanol     Status: None   Collection Time: 12/31/19  4:27 AM  Result Value Ref Range   Alcohol, Ethyl (B) <10 <10 mg/dL    Comment: (NOTE) Lowest detectable limit for serum  alcohol is 10 mg/dL. For medical purposes only. Performed at [facility], [address] [place], [place] [address]   Salicylate level     Status: Abnormal   Collection Time: 12/31/19  4:27 AM  Result Value Ref Range   Salicylate Lvl <7.0 (L) 7.0 - 30.0 mg/dL    Comment: Performed at [facility], [address] [place], [place] [address]  Acetaminophen level     Status: Abnormal   Collection Time: 12/31/19  4:27 AM  Result Value Ref Range   Acetaminophen (Tylenol), Serum <10 (L) 10 - 30 ug/mL    Comment: (NOTE) Therapeutic concentrations vary significantly. A range of 10-30 ug/mL  may be an effective concentration for many patients. However, some  are best treated at concentrations outside of this range. Acetaminophen concentrations >150 ug/mL at 4 hours after ingestion  and >50 ug/mL at 12 hours after ingestion are often associated with  toxic reactions. Performed at [facility], [address] [place], [place] [address]   cbc     Status: None   Collection Time: 12/31/19  4:27 AM  Result Value Ref Range   WBC 7.5 4.0 - 10.5 K/uL   RBC 4.39 3.87 - 5.11 MIL/uL   Hemoglobin 13.3 12.0 - 15.0 g/dL   HCT 86.5 78.4 - 69.6 %   MCV 90.0 80.0 - 100.0 fL   MCH 30.3 26.0 - 34.0 pg   MCHC 33.7 30.0 - 36.0 g/dL   RDW 29.5 28.4 - 13.2 %   Platelets 266 150 - 400 K/uL   nRBC 0.0 0.0 - 0.2 %    Comment: Performed at [facility], [address], [place], [place] [address]  Urine Drug Screen, Qualitative     Status: Abnormal   Collection Time: 12/31/19  4:27 AM  Result Value Ref Range   Tricyclic, Ur Screen NONE DETECTED NONE DETECTED   Amphetamines, Ur Screen POSITIVE (A) NONE DETECTED   MDMA (Ecstasy)Ur Screen NONE DETECTED NONE DETECTED   Cocaine Metabolite,Ur [place] NONE DETECTED NONE DETECTED   Opiate, Ur Screen NONE DETECTED NONE DETECTED   Phencyclidine (PCP) Ur S NONE DETECTED NONE DETECTED   Cannabinoid 50 Ng, Ur [place] POSITIVE (A) NONE  DETECTED   Barbiturates, Ur Screen NONE DETECTED NONE DETECTED   Benzodiazepine, Ur Scrn NONE DETECTED NONE DETECTED   Methadone Scn, Ur NONE DETECTED NONE DETECTED    Comment: (NOTE) Tricyclics + metabolites, urine    Cutoff 1000 ng/mL Amphetamines + metabolites, urine  Cutoff 1000 ng/mL MDMA (Ecstasy), urine              Cutoff 500 ng/mL Cocaine Metabolite, urine          Cutoff 300 ng/mL Opiate + metabolites, urine        Cutoff 300 ng/mL Phencyclidine (PCP), urine         Cutoff 25 ng/mL Cannabinoid, urine                 Cutoff 50 ng/mL Barbiturates + metabolites, urine  Cutoff 200 ng/mL Benzodiazepine, urine              Cutoff 200 ng/mL Methadone, urine                   Cutoff 300 ng/mL The urine drug screen provides only a preliminary, unconfirmed analytical test result and should not be used for non-medical purposes. Clinical consideration and professional judgment should be applied to any positive drug screen result due to possible interfering substances. A more specific alternate chemical method must be used in order to obtain a confirmed analytical result. Gas chromatography / mass spectrometry (GC/MS) is the preferred confirmat ory method. Performed at [facility], [address], [place], [place] [address]   Pregnancy, urine POC     Status: None   Collection Time: 12/31/19  4:40 AM  Result Value Ref Range   Preg Test, Ur NEGATIVE NEGATIVE    Comment:        THE SENSITIVITY OF THIS METHODOLOGY IS >24 mIU/mL     Current Facility-Administered Medications  Medication Dose Route Frequency Provider Last Rate Last Admin  . bacitracin ointment   Topical BID Darci Current, MD   1 application at 12/31/19 [id number]   Current Outpatient Medications  Medication Sig Dispense Refill  . albuterol (VENTOLIN HFA) 108 (90 Base) MCG/ACT inhaler Inhale 2 puffs into the lungs every 6 (six) hours as needed for wheezing or shortness of breath. 18 g 0  . ARIPiprazole  (ABILIFY) 15 MG tablet Take 15 mg by mouth at bedtime.    Marland Kitchen FLUoxetine (PROZAC) 40 MG capsule Take 2 capsules (80 mg total) by mouth daily. 60 capsule 1  . gabapentin (NEURONTIN) 100 MG capsule Take 100 mg by mouth 3 (three) times daily.    Marland Kitchen ketorolac (ACULAR) 0.5 % ophthalmic solution Place 1 drop into both eyes 4 (four) times daily. 5 mL 0  . naloxone (NARCAN) nasal spray 4 mg/0.1 mL One spray for overdose 1 each 0  . SUBOXONE 8-2 MG FILM Place 1 strip under the tongue daily.    . traZODone (DESYREL) 50 MG tablet Take 50 mg by mouth at bedtime.      Musculoskeletal: Strength & Muscle Tone: within normal limits Gait & Station: normal Patient leans: N/A  Psychiatric Specialty Exam: Physical Exam  Review of Systems  Psychiatric/Behavioral: Negative for behavioral problems, confusion, hallucinations and suicidal ideas.    Blood pressure 125/85, pulse 90, temperature 98 F (36.7 C), resp. rate 19, height 5\' 5"  (1.651 m), weight 76.2 kg, last menstrual period 12/31/2019, SpO2 100 %, unknown if currently breastfeeding.Body mass index is 27.96 kg/m.  General Appearance: Casual  Eye Contact:  Fair  Speech:  Clear and Coherent  Volume:  Normal  Mood:  Euthymic  Affect:  Congruent  Thought Process:  Coherent  Orientation:  Full (Time, Place, and Person)  Thought Content:  Logical  Suicidal Thoughts:  No  Homicidal Thoughts:  No  Memory:  Recent;   Fair  Judgement:  Fair  Insight:  Fair  Psychomotor Activity:  Normal  Concentration:  Concentration: Fair  Recall:  01/02/2020 of Knowledge:  Fair  Language:  Fair  Akathisia:  No  Handed:  Right  AIMS (if indicated):     Assets:  Communication Skills Desire for Improvement Physical Health Resilience  ADL's:  Intact  Cognition:  WNL  Sleep:        Treatment Plan Summary: -year-old woman presenting with psychotic-like symptoms in the context of methamphetamine intoxication.  Patient given time in the ED to sober up is now  denying any psychiatric symptoms.  IVC will be rescinded and patient will be discharged home with substance abuse resources.  Diagnosis: Methamphetamine abuse  Disposition: No evidence of imminent risk to self or others at present.   Patient does not meet criteria for psychiatric inpatient admission. Supportive therapy provided about ongoing stressors. Discussed crisis plan, support from social network, calling 911, coming to the Emergency Department, and calling Suicide Hotline.  [employer], MD 12/31/2019 11:50 AM

## 2019-12-31 NOTE — ED Notes (Signed)
IVC PAPERS  RESCINDED  INFORMED  SAM RN

## 2019-12-31 NOTE — ED Notes (Addendum)
Pt to toilet; request for underwear and pads, pt indicates to broken skin on neck and requests neosporin "my tattoo got infected"  (pt was seen earlier in the week for same)  Dr Manson Passey notified and orders received   Pt given meal tray and additional blanket

## 2019-12-31 NOTE — ED Notes (Addendum)
Patients belongings placed in labeled belongings bag to be secured on unit.  1 cell phone, 1 charging cord.  1 pink colored  t-shirt, 1 pair black colored leggings with pink and white colored designs, 1 burgandy colored panties, 1 black colored bray,  1 pair of black colored flat shoes, 1 belly button piercing, 1 blue colored hair tie. Black colored jacket.

## 2019-12-31 NOTE — BH Assessment (Signed)
Assessment Note  Betty Gomez is an 34 y.o. female who presents to the ER via law enforcement due to having odd and bizarre behaviors. Per the report of the patient, her family called the police because they thought she was "actting crazy." Patient admits to the use of methamphetamine and believes that was the cause of her behavior and disorganized thoughts.  During the interview, the patient was calm, cooperative and pleasant. She was able to provide appropriate answers to the questions. Throughout the interview, she denied SI/HI and AV/H.  Diagnosis: Bipolar  Past Medical History:  Past Medical History:  Diagnosis Date  . Anxiety   . Asthma   . Depression    ok, came off meds, has someone she sees for this.  . Endometriosis   . Infection    UTI    Past Surgical History:  Procedure Laterality Date  . DIAGNOSTIC LAPAROSCOPY     endometriosis  . TUBAL LIGATION N/A 09/17/2015   Procedure: POST PARTUM TUBAL LIGATION;  Surgeon: Malachy Mood, MD;  Location: [facility] ORS;  Service: Gynecology;  Laterality: N/A;  . TUBAL LIGATION Bilateral 09/21/2016   Procedure: POST PARTUM TUBAL LIGATION;  Surgeon: Gae Dry, MD;  Location: [facility] ORS;  Service: Gynecology;  Laterality: Bilateral;    Family History:  Family History  Problem Relation Age of Onset  . Hypertension Mother   . Diabetes Mother   . Heart disease Mother 75       failure  . Heart failure Mother   . Depression Father   . Heart disease Maternal Grandfather   . Diabetes Paternal Grandmother   . Hypertension Paternal Grandmother   . Cancer Paternal Grandfather        lung  . Hearing loss Neg Hx     Social History:  reports that she has been smoking cigarettes. She has a 5.00 pack-year smoking history. She has never used smokeless tobacco. She reports current drug use. Drugs: Marijuana, Methamphetamines, and Heroin. She reports that she does not drink alcohol.  Additional Social History:  Alcohol / Drug  Use Pain Medications: See PTA Prescriptions: See PTA Over the Counter: See PTA History of alcohol / drug use?: Yes Longest period of sobriety (when/how long): Unable to quantify Substance #1 Name of Substance 1: Methamphetamine 1 - Last Use / Amount: 12/29/2019 Substance #2 Name of Substance 2: Cannabis 2 - Last Use / Amount: 12/2019  CIWA: CIWA-Ar BP: 125/85 Pulse Rate: 90 COWS:    Allergies: No Known Allergies  Home Medications: (Not in a hospital admission)   OB/GYN Status:  Patient's last menstrual period was 12/31/2019 (approximate).  General Assessment Data Location of Assessment: [facility] ED TTS Assessment: In system Is this a Tele or Face-to-Face Assessment?: Face-to-Face Is this an Initial Assessment or a Re-assessment for this encounter?: Initial Assessment Patient Accompanied by:: N/A Language Other than English: No Living Arrangements: Other (Comment)(Private Home) What gender do you identify as?: Female Marital status: Single Pregnancy Status: No Living Arrangements: Other relatives(Lives with her grandmother) Can pt return to current living arrangement?: Yes Admission Status: Involuntary Petitioner: ED Attending Is patient capable of signing voluntary admission?: No(Under IVC) Referral Source: Self/Family/Friend Insurance type: Medicaid  Medical Screening Exam ([place]) Medical Exam completed: Yes  Crisis Care Plan Living Arrangements: Other relatives(Lives with her grandmother) Legal Guardian: Other:(Self) Name of Psychiatrist: [place] Name of Therapist: [place]  Education Status Is patient currently in school?: No Is the patient employed, unemployed or receiving  disability?: Unemployed  Risk to self with the past 6 months Suicidal Ideation: No Has patient been a risk to self within the past 6 months prior to admission? : No Suicidal Intent: No Has patient had any suicidal intent within the past 6 months  prior to admission? : No Is patient at risk for suicide?: No Suicidal Plan?: No Has patient had any suicidal plan within the past 6 months prior to admission? : No Access to Means: No What has been your use of drugs/alcohol within the last 12 months?: Cannabis & Methamphetamine Previous Attempts/Gestures: Yes How many times?: 0 Other Self Harm Risks: Active drug use Triggers for Past Attempts: None known Intentional Self Injurious Behavior: None Family Suicide History: Unknown Recent stressful life event(s): Other (Comment) Persecutory voices/beliefs?: No Depression: Yes Depression Symptoms: Feeling angry/irritable Substance abuse history and/or treatment for substance abuse?: Yes Suicide prevention information given to non-admitted patients: Not applicable  Risk to Others within the past 6 months Homicidal Ideation: No Does patient have any lifetime risk of violence toward others beyond the six months prior to admission? : No Thoughts of Harm to Others: No Current Homicidal Intent: No Current Homicidal Plan: No Access to Homicidal Means: No Identified Victim: Reports of none History of harm to others?: No Assessment of Violence: None Noted Violent Behavior Description: Reports of none Does patient have access to weapons?: No Does patient have a court date: No Is patient on probation?: No  Psychosis Hallucinations: None noted Delusions: None noted  Mental Status Report Appearance/Hygiene: Unremarkable, In scrubs Eye Contact: Good Motor Activity: Freedom of movement, Unremarkable Speech: Logical/coherent, Unremarkable Level of Consciousness: Alert Mood: Anxious, Pleasant Affect: Appropriate to circumstance, Anxious Anxiety Level: Minimal Thought Processes: Coherent, Relevant Judgement: Unimpaired Orientation: Person, Place, Time, Situation, Appropriate for developmental age Obsessive Compulsive Thoughts/Behaviors: None  Cognitive Functioning Concentration:  Normal Memory: Recent Intact, Remote Intact Is patient IDD: No Insight: Fair Impulse Control: Fair Appetite: Good Have you had any weight changes? : No Change Sleep: Decreased Total Hours of Sleep: 6(Due to drug use) Vegetative Symptoms: None  ADLScreening [facility] Assessment Services) Patient's cognitive ability adequate to safely complete daily activities?: Yes Patient able to express need for assistance with ADLs?: Yes Independently performs ADLs?: Yes (appropriate for developmental age)  Prior Inpatient Therapy Prior Inpatient Therapy: Yes Prior Therapy Dates: 07/220 & 11/2018 Prior Therapy Facilty/Provider(s): [facility] Reason for Treatment: Bipolar  Prior Outpatient Therapy Prior Outpatient Therapy: Yes Prior Therapy Dates: Current Prior Therapy Facilty/Provider(s): [facility] Reason for Treatment: Bipolar Does patient have an ACCT team?: No Does patient have Intensive In-House Services?  : No Does patient have [facility] services? : No Does patient have P4CC services?: No  ADL Screening (condition at time of admission) Patient's cognitive ability adequate to safely complete daily activities?: Yes Is the patient deaf or have difficulty hearing?: No Does the patient have difficulty seeing, even when wearing glasses/contacts?: No Does the patient have difficulty concentrating, remembering, or making decisions?: No Patient able to express need for assistance with ADLs?: Yes Does the patient have difficulty dressing or bathing?: No Independently performs ADLs?: Yes (appropriate for developmental age) Does the patient have difficulty walking or climbing stairs?: No Weakness of Legs: None Weakness of Arms/Hands: None  Home Assistive Devices/Equipment Home Assistive Devices/Equipment: None  Therapy Consults (therapy consults require a physician order) PT Evaluation Needed: No OT Evalulation Needed: No SLP Evaluation Needed: No Abuse/Neglect Assessment  (Assessment to be complete while patient is alone) Abuse/Neglect Assessment Can Be  Completed: Yes Physical Abuse: Denies Verbal Abuse: Denies Sexual Abuse: Denies Exploitation of patient/patient's resources: Denies Self-Neglect: Denies Values / Beliefs Cultural Requests During Hospitalization: None Spiritual Requests During Hospitalization: None Consults Spiritual Care Consult Needed: No Transition of Care Team Consult Needed: No Advance Directives (For Healthcare) Does Patient Have a Medical Advance Directive?: No  Child/Adolescent Assessment Running Away Risk: Denies(Patient is an adult)  Disposition:  Disposition Initial Assessment Completed for this Encounter: Yes  On Site Evaluation by:   Reviewed with Physician:    Lilyan Gilford MS, LCAS, [facility], [facility] Therapeutic Triage Specialist 12/31/2019 2:27 PM

## 2019-12-31 NOTE — ED Triage Notes (Signed)
Patient to the ED with [place] [employer] under IVC.  IVC paper work state patient is bipolar and not taking medications and hearing voices.

## 2019-12-31 NOTE — ED Provider Notes (Signed)
[facility] Emergency Department Provider Note  ____________________________________________   First MD Initiated Contact with Patient 12/31/19 [phone]     (approximate)  I have reviewed the triage vital signs and the nursing notes.   HISTORY  Chief Complaint Psychiatric Evaluation    HPI Betty Gomez is a 34 y.o. female with below list of previous medical conditions presents to the emergency department voluntarily committed secondary to history of bipolar disorder not taking medication and hearing voices also reported suicidal ideation.  Patient denies any suicidal ideation.  Patient does however admit to using methamphetamine 2 hours ago.        Past Medical History:  Diagnosis Date  . Anxiety   . Asthma   . Depression    ok, came off meds, has someone she sees for this.  . Endometriosis   . Infection    UTI    Patient Active Problem List   Diagnosis Date Noted  . MDD (major depressive disorder), severe (HCC) 06/14/2019  . Drug overdose, intentional, initial encounter (HCC) 06/10/2019  . Severe recurrent major depression without psychotic features (HCC) 11/17/2018  . Bipolar disorder, unspecified (HCC) 11/17/2018  . Substance induced mood disorder (HCC) 11/17/2018  . Cannabis abuse 11/17/2018  . Methamphetamine abuse (HCC) 11/17/2018  . Opioid abuse (HCC) 11/17/2018  . Tobacco abuse 11/17/2018  . Polysubstance abuse (HCC) 11/17/2018  . Suicidal ideation 11/16/2018  . Depression with suicidal ideation 11/16/2018  . Alcohol abuse 11/16/2018  . 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 Benzodiazepine abuse (HCC) 09/21/2017  . Normal labor 09/20/2016  . Labor and delivery, indication for care 09/08/2016  . Decreased fetal movement 07/24/2016  . Pregnancy 09/15/2015  . Insufficient prenatal care 09/15/2015  . Indication for care in labor or delivery 09/10/2015  . Braxton Hicks contractions  09/08/2015  . Candida albicans infection 08/16/2015  . Abnormal antenatal AFP screen 05/24/2015  . Irregular contractions 05/24/2015    Past Surgical History:  Procedure Laterality Date  . DIAGNOSTIC LAPAROSCOPY     endometriosis  . TUBAL LIGATION N/A 09/17/2015   Procedure: POST PARTUM TUBAL LIGATION;  Surgeon: Vena Austria, MD;  Location: [facility] ORS;  Service: Gynecology;  Laterality: N/A;  . TUBAL LIGATION Bilateral 09/21/2016   Procedure: POST PARTUM TUBAL LIGATION;  Surgeon: Nadara Mustard, MD;  Location: [facility] ORS;  Service: Gynecology;  Laterality: Bilateral;    Prior to Admission medications   Medication Sig Start Date End Date Taking? Authorizing Provider  albuterol (VENTOLIN HFA) 108 (90 Base) MCG/ACT inhaler Inhale 2 puffs into the lungs every 6 (six) hours as needed for wheezing or shortness of breath. 06/18/19   Clapacs, Jackquline Denmark, MD  FLUoxetine (PROZAC) 40 MG capsule Take 2 capsules (80 mg total) by mouth daily. 06/19/19   Clapacs, Jackquline Denmark, MD  gabapentin (NEURONTIN) 300 MG capsule Take 1 capsule (300 mg total) by mouth 2 (two) times daily. 06/18/19   Clapacs, Jackquline Denmark, MD  ketorolac (ACULAR) 0.5 % ophthalmic solution Place 1 drop into both eyes 4 (four) times daily. 12/01/19   Menshew, Charlesetta Ivory, PA-C  naloxone [facility]) nasal spray 4 mg/0.1 mL One spray for overdose 06/12/19   Alford Highland, MD  traZODone (DESYREL) 100 MG tablet Take 2 tablets (200 mg total) by mouth at bedtime. 06/18/19   Clapacs, Jackquline Denmark, MD    Allergies Patient has no known allergies.  Family History  Problem Relation Age of Onset  . Hypertension Mother   .  Diabetes Mother   . Heart disease Mother 75       failure  . Heart failure Mother   . Depression Father   . Heart disease Maternal Grandfather   . Diabetes Paternal Grandmother   . Hypertension Paternal Grandmother   . Cancer Paternal Grandfather        lung  . Hearing loss Neg Hx     Social History Social History   Tobacco Use  .  Smoking status: Heavy Tobacco Smoker    Packs/day: 0.50    Years: 10.00    Pack years: 5.00    Types: Cigarettes  . Smokeless tobacco: Never Used  Substance Use Topics  . Alcohol use: No  . Drug use: Yes    Types: Marijuana, Methamphetamines, Heroin    Comment: heroin    Review of Systems Constitutional: No fever/chills Eyes: No visual changes. ENT: No sore throat. Cardiovascular: Denies chest pain. Respiratory: Denies shortness of breath. Gastrointestinal: No abdominal pain.  No nausea, no vomiting.  No diarrhea.  No constipation. Genitourinary: Negative for dysuria. Musculoskeletal: Negative for neck pain.  Negative for back pain. Integumentary: Negative for rash. Neurological: Negative for headaches, focal weakness or numbness. Psychiatric:  Positive for methamphetamine abuse.  Reported suicidal ideation.   ____________________________________________   PHYSICAL EXAM:  VITAL SIGNS: ED Triage Vitals [12/31/19 0414]  Enc Vitals Group     BP (!) 103/44     Pulse Rate 92     Resp 18     Temp 98.3 F (36.8 C)     Temp Source Oral     SpO2 99 %     Weight 76.2 kg (168 lb)     Height 1.651 m (5\' 5" )     Head Circumference      Peak Flow      Pain Score      Pain Loc      Pain Edu?      Excl. in [place]?      Constitutional: Alert and oriented.  Appears intoxicated Eyes: Conjunctivae are normal.  Head: Atraumatic. Mouth/Throat: Patient is wearing a mask. Neck: No stridor.  No meningeal signs.   Cardiovascular: Normal rate, regular rhythm. Good peripheral circulation. Grossly normal heart sounds. Respiratory: Normal respiratory effort.  No retractions. Gastrointestinal: Soft and nontender. No distention.  Musculoskeletal: No lower extremity tenderness nor edema. No gross deformities of extremities. Neurologic:  Normal speech and language. No gross focal neurologic deficits are appreciated.  Skin:  Skin is warm, dry and intact. Psychiatric: Speech and behavior are  normal.  ____________________________________________   LABS (all labs ordered are listed, but only abnormal results are displayed)  Labs Reviewed  COMPREHENSIVE METABOLIC PANEL - Abnormal; Notable for the following components:      Result Value   Glucose, Bld 103 (*)    All other components within normal limits  SALICYLATE LEVEL - Abnormal; Notable for the following components:   Salicylate Lvl <7.0 (*)    All other components within normal limits  ACETAMINOPHEN LEVEL - Abnormal; Notable for the following components:   Acetaminophen (Tylenol), Serum <10 (*)    All other components within normal limits  URINE DRUG SCREEN, QUALITATIVE ([facility] ONLY) - Abnormal; Notable for the following components:   Amphetamines, Ur Screen POSITIVE (*)    Cannabinoid 50 Ng, Ur [place] POSITIVE (*)    All other components within normal limits  ETHANOL  CBC  POC URINE PREG, ED  POCT PREGNANCY, URINE     Procedures  ____________________________________________   INITIAL IMPRESSION / MDM / ASSESSMENT AND PLAN / ED COURSE  As part of my medical decision making, I reviewed the following data within the electronic MEDICAL RECORD NUMBER   34 year old female presented with above-stated history and physical exam awaiting psychiatry consultation.      ____________________________________________  FINAL CLINICAL IMPRESSION(S) / ED DIAGNOSES  Final diagnoses:  Methamphetamine abuse ([place])     MEDICATIONS GIVEN DURING THIS VISIT:  Medications  bacitracin ointment (has no administration in time range)     ED Discharge Orders    None      *Please note:  Betty Gomez was evaluated in Emergency Department on 12/31/2019 for the symptoms described in the history of present illness. She was evaluated in the context of the global COVID-19 pandemic, which necessitated consideration that the patient might be at risk for infection with the SARS-CoV-2 virus that causes COVID-19. Institutional  protocols and algorithms that pertain to the evaluation of patients at risk for COVID-19 are in a state of rapid change based on information released by regulatory bodies including the CDC and federal and state organizations. These policies and algorithms were followed during the patient's care in the ED.  Some ED evaluations and interventions may be delayed as a result of limited staffing during the pandemic.*  Note:  This document was prepared using Dragon voice recognition software and may include unintentional dictation errors.   Gregor Hams, MD 12/31/19 [phone]

## 2019-12-31 NOTE — ED Notes (Signed)
Pt given belongings and getting dressed at this time. Pt's ride is on the way to pick up at front entrance.

## 2019-12-31 NOTE — ED Notes (Signed)
TTS unable to assess at this time, as patient has not been assigned a room and is in the waiting room.

## 2020-02-08 ENCOUNTER — Other Ambulatory Visit: Payer: Self-pay

## 2020-02-08 ENCOUNTER — Emergency Department
Admission: EM | Admit: 2020-02-08 | Discharge: 2020-02-08 | Disposition: A | Discharge status: Home / Self Care | Payer: Medicaid Other | Attending: Emergency Medicine | Admitting: Emergency Medicine

## 2020-02-08 DIAGNOSIS — F17210 Nicotine dependence, cigarettes, uncomplicated: Secondary | ICD-10-CM | POA: Diagnosis not present

## 2020-02-08 DIAGNOSIS — Z79899 Other long term (current) drug therapy: Secondary | ICD-10-CM | POA: Insufficient documentation

## 2020-02-08 DIAGNOSIS — J45909 Unspecified asthma, uncomplicated: Secondary | ICD-10-CM | POA: Diagnosis not present

## 2020-02-08 DIAGNOSIS — T40601A Poisoning by unspecified narcotics, accidental (unintentional), initial encounter: Secondary | ICD-10-CM

## 2020-02-08 DIAGNOSIS — T402X1A Poisoning by other opioids, accidental (unintentional), initial encounter: Secondary | ICD-10-CM | POA: Diagnosis not present

## 2020-02-08 LAB — BASIC METABOLIC PANEL
Anion gap: 10 (ref 5–15)
BUN: 9 mg/dL (ref 6–20)
CO2: 23 mmol/L (ref 22–32)
Calcium: 9.9 mg/dL (ref 8.9–10.3)
Chloride: 100 mmol/L (ref 98–111)
Creatinine, Ser: 0.86 mg/dL (ref 0.44–1.00)
GFR calc Af Amer: >60 mL/min (ref >60)
GFR calc non Af Amer: >60 mL/min (ref >60)
Glucose, Bld: 128 mg/dL — ABNORMAL HIGH (ref 70–99)
Potassium: 3.1 mmol/L — ABNORMAL LOW (ref 3.5–5.1)
Sodium: 133 mmol/L — ABNORMAL LOW (ref 135–145)

## 2020-02-08 LAB — CBC
HCT: 46.3 % — ABNORMAL HIGH (ref 36.0–46.0)
Hemoglobin: 14.9 g/dL (ref 12.0–15.0)
MCH: 29.3 pg (ref 26.0–34.0)
MCHC: 32.2 g/dL (ref 30.0–36.0)
MCV: 91.1 fL (ref 80.0–100.0)
Platelets: 301 10*3/uL (ref 150–400)
RBC: 5.08 MIL/uL (ref 3.87–5.11)
RDW: 12.7 % (ref 11.5–15.5)
WBC: 11.9 10*3/uL — ABNORMAL HIGH (ref 4.0–10.5)
nRBC: 0 % (ref 0.0–0.2)

## 2020-02-08 LAB — ACETAMINOPHEN LEVEL: Acetaminophen (Tylenol), Serum: <10 ug/mL — ABNORMAL LOW (ref 10–30)

## 2020-02-08 LAB — SALICYLATE LEVEL: Salicylate Lvl: <7 mg/dL — ABNORMAL LOW (ref 7.0–30.0)

## 2020-02-08 MED ORDER — NALOXONE HCL 2 MG/2ML IJ SOSY
2.0000 mg | PREFILLED_SYRINGE | Freq: Once | INTRAMUSCULAR | Status: DC
  Filled 2020-02-08: qty 2

## 2020-02-08 MED ORDER — NALOXONE HCL 4 MG/0.1ML NA LIQD
1.0000 | Freq: Once | NASAL | Status: AC
  Administered 2020-02-08: 1 via NASAL
  Filled 2020-02-08: qty 4

## 2020-02-08 NOTE — ED Triage Notes (Signed)
Pt reports taking "oxy-something, I'm not sure." Pt reports being a previous drug user, having been clean for about 1.5 wks

## 2020-02-08 NOTE — ED Notes (Signed)
Pt found up out of bed trying to get a drink of water. Pt asked to please stay in bed for safety concerns and call out when in need of assistance/ Pt A&Ox4 and able to talk in complete sentences. Pt's speech slurred intermittently with low eyes/drowsy apperance but awake.

## 2020-02-08 NOTE — Discharge Instructions (Addendum)

## 2020-02-08 NOTE — ED Provider Notes (Signed)
[facility] Emergency Department Provider Note  ____________________________________________   First MD Initiated Contact with Patient 02/08/20 1736     (approximate)  I have reviewed the triage vital signs and the nursing notes.  EM caveat: Patient reports some loss of recollection occurring shortly or immediately after injecting heroin   HISTORY  Chief Complaint Drug Overdose  HPI Betty Gomez is a 34 y.o. female here for evaluation for overdose  Patient reports that she met someone online today, she met that person in about 3 PM they took a pill that she believes was OxyContin or oxycodone, he then gave her heroin to try injecting.  She injected that, and then does not know what happened after that.  She denies that she had any suicidal intent.  She reports she was just using.  She has a history of drug use, stopped about 2 weeks ago, but then rebounded again today.  She denies any desire to hurt her self or anyone else.  Reports she is taking medications for bipolar disorder.  She does frequently use marijuana.  She is currently not in any pain or discomfort.  She does feel little bit tired.  EMS reports the patient received 2 mg of intranasal naloxone, bag valve masking, but had a strong pulse on EMS arrival and regained consciousness shortly thereafter.  She denies depression.  Denies use of any other substance or overdose.  She reports this was not intentional, she was not trying to harm her self.  She was using recreationally.     Past Medical History:  Diagnosis Date  . Anxiety   . Asthma   . Depression    ok, came off meds, has someone she sees for this.  . Endometriosis   . Infection    UTI    Patient Active Problem List   Diagnosis Date Noted  . MDD (major depressive disorder), severe ([place]) 06/14/2019  . Drug overdose, intentional, initial encounter ([place]) 06/10/2019  . Severe recurrent major depression without psychotic  features ([place]) 11/17/2018  . Bipolar disorder, unspecified ([place]) 11/17/2018  . Substance induced mood disorder ([place]) 11/17/2018  . Cannabis abuse 11/17/2018  . Methamphetamine abuse ([place]) 11/17/2018  . Opioid abuse ([place]) 11/17/2018  . Tobacco abuse 11/17/2018  . Polysubstance abuse ([place]) 11/17/2018  . Suicidal ideation 11/16/2018  . Depression with suicidal ideation 11/16/2018  . Alcohol abuse 11/16/2018  . 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 Benzodiazepine abuse ([place]) 09/21/2017  . Normal labor 09/20/2016  . Labor and delivery, indication for care 09/08/2016  . Decreased fetal movement 07/24/2016  . Pregnancy 09/15/2015  . Insufficient prenatal care 09/15/2015  . Indication for care in labor or delivery 09/10/2015  . Braxton Hicks contractions 09/08/2015  . Candida albicans infection 08/16/2015  . Abnormal antenatal AFP screen 05/24/2015  . Irregular contractions 05/24/2015    Past Surgical History:  Procedure Laterality Date  . DIAGNOSTIC LAPAROSCOPY     endometriosis  . TUBAL LIGATION N/A 09/17/2015   Procedure: POST PARTUM TUBAL LIGATION;  Surgeon: Malachy Mood, MD;  Location: [facility] ORS;  Service: Gynecology;  Laterality: N/A;  . TUBAL LIGATION Bilateral 09/21/2016   Procedure: POST PARTUM TUBAL LIGATION;  Surgeon: Gae Dry, MD;  Location: [facility] ORS;  Service: Gynecology;  Laterality: Bilateral;    Prior to Admission medications   Medication Sig Start Date End Date Taking? Authorizing Provider  albuterol (VENTOLIN HFA) 108 (90 Base) MCG/ACT inhaler Inhale 2 puffs into  the lungs every 6 (six) hours as needed for wheezing or shortness of breath. 06/18/19   Clapacs, Madie [place]o, MD  ARIPiprazole (ABILIFY) 15 MG tablet Take 15 mg by mouth at bedtime. 11/28/19   [provider]  FLUoxetine (PROZAC) 40 MG capsule Take 2 capsules (80 mg total) by mouth daily. 06/19/19   Clapacs, Madie Reno, MD  gabapentin (NEURONTIN) 100 MG  capsule Take 100 mg by mouth 3 (three) times daily. 11/28/19   [provider]  ketorolac (ACULAR) 0.5 % ophthalmic solution Place 1 drop into both eyes 4 (four) times daily. 12/01/19   Menshew, Dannielle Karvonen, PA-C  naloxone [facility]) nasal spray 4 mg/0.1 mL One spray for overdose 06/12/19   Loletha Grayer, MD  SUBOXONE 8-2 MG FILM Place 1 strip under the tongue daily. 11/26/19   [provider]  traZODone (DESYREL) 50 MG tablet Take 50 mg by mouth at bedtime. 11/28/19   [provider]    Allergies Patient has no known allergies.  Family History  Problem Relation Age of Onset  . Hypertension Mother   . Diabetes Mother   . Heart disease Mother 95       failure  . Heart failure Mother   . Depression Father   . Heart disease Maternal Grandfather   . Diabetes Paternal Grandmother   . Hypertension Paternal Grandmother   . Cancer Paternal Grandfather        lung  . Hearing loss Neg Hx     Social History Social History   Tobacco Use  . Smoking status: Heavy Tobacco Smoker    Packs/day: 0.50    Years: 10.00    Pack years: 5.00    Types: Cigarettes  . Smokeless tobacco: Never Used  Substance Use Topics  . Alcohol use: No  . Drug use: Yes    Types: Marijuana, Methamphetamines, Heroin    Comment: heroin    Review of Systems Constitutional: No fever/chills Cardiovascular: Denies chest pain. Respiratory: Denies shortness of breath. Gastrointestinal: No abdominal pain.  Denies pregnancy. Insert  Musculoskeletal: Negative for back pain. Skin: Negative for rash. Neurological: Negative for headaches, areas of focal weakness or numbness.    ____________________________________________   PHYSICAL EXAM:  VITAL SIGNS: ED Triage Vitals  Enc Vitals Group     BP 02/08/20 1743 118/81     Pulse Rate 02/08/20 1743 68     Resp 02/08/20 1743 16     Temp --      Temp src --      SpO2 02/08/20 1736 99 %     Weight --      Height --      Head  Circumference --      Peak Flow --      Pain Score --      Pain Loc --      Pain Edu? --      Excl. in [place]? --     Constitutional: Alert and oriented. Well appearing and in no acute distress.  She is somewhat fatigued but in no distress.  Fully alert and conversant. Eyes: Conjunctivae are slightly injected. Head: Atraumatic. Nose: No congestion/rhinnorhea. Mouth/Throat: Mucous membranes are moist. Neck: No stridor.  Cardiovascular: Normal rate, regular rhythm. Grossly normal heart sounds.  Good peripheral circulation. Respiratory: Normal respiratory effort.  No retractions. Lungs CTAB. Gastrointestinal: Soft and nontender. No distention. Musculoskeletal: No lower extremity tenderness nor edema. Neurologic:  Normal speech and language. No gross focal neurologic deficits are appreciated.  Skin:  Skin is warm, dry and intact. No rash noted. Psychiatric: Mood and affect are calm, somewhat expressing remorse of overdosing, denies any desire to hurt her self or anyone else.  Denies hallucinations..  ____________________________________________   LABS (all labs ordered are listed, but only abnormal results are displayed)  Labs Reviewed  CBC - Abnormal; Notable for the following components:      Result Value   WBC 11.9 (*)    HCT 46.3 (*)    All other components within normal limits  BASIC METABOLIC PANEL - Abnormal; Notable for the following components:   Sodium 133 (*)    Potassium 3.1 (*)    Glucose, Bld 128 (*)    All other components within normal limits  ACETAMINOPHEN LEVEL - Abnormal; Notable for the following components:   Acetaminophen (Tylenol), Serum <10 (*)    All other components within normal limits  SALICYLATE LEVEL - Abnormal; Notable for the following components:   Salicylate Lvl <2.0 (*)    All other components within normal limits   ____________________________________________  EKG  Reviewed inter by me at 1810 Heart rate 80 QRS 140 QTc 480 Normal sinus  rhythm, some slight underlying artifact.  No evidence of acute ischemia. ____________________________________________  RADIOLOGY   ____________________________________________   PROCEDURES  Procedure(s) performed: None  Procedures  Critical Care performed: No  ____________________________________________   INITIAL IMPRESSION / ASSESSMENT AND PLAN / ED COURSE  Pertinent labs & imaging results that were available during my care of the patient were reviewed by me and considered in my medical decision making (see chart for details).   Patient here for evaluation of what appears to be an apparent unintentional overdose.  Explicitly denies that this was attempted herself or anyone else.  She reports relapsing after having sobriety from drug use for about 2 weeks time.  She does report ingestion of a medication believed to be oxycodone or OxyContin as well as use of injectable opioid.  Currently awake and alert, slightly somnolent.  Monitoring her carefully, on telemetry pulse oximetry, naloxone at the bedside monitoring for signs of reoverdose.  Will check screening labs.  Clinical Course as of Feb 08 2047  Fri Feb 08, 2020  2045 Patient is fully awake alert, no complaints or concerns at this time.  She is resting comfortably, has been several hours since she received any naloxone and she is now fully awake.  No distress.  No signs of ongoing overdose.  She has a friend will be picking her up.  Counseled her on the dangers of opioid overdose, heroin use and nonprescription medications.   [MQ]    Clinical Course User Index [MQ] Delman Kitten, MD    Return precautions and treatment recommendations and follow-up discussed with the patient who is agreeable with the plan.  Counseled patient on use of rescue naloxone medication which I will provide her to take with her. ____________________________________________   FINAL CLINICAL IMPRESSION(S) / ED DIAGNOSES  Final diagnoses:  Opiate  overdose, accidental or unintentional, initial encounter [facility])        Note:  This document was prepared using Dragon voice recognition software and may include unintentional dictation errors       Delman Kitten, MD 02/08/20 2049

## 2020-02-08 NOTE — ED Notes (Signed)
Police at bedside ..

## 2020-02-08 NOTE — ED Triage Notes (Signed)
Pt [place] [facility] [facility] via [facility] with complaint of overdose following heroin injection. Pt reports she was meeting a date for the first time and he offered to inject her with heroin, which she agreed to. Pt was next found unresponsive and per EMS had been apneic for approx 15 min without resus. Pt received 2 narcan IN en route. Pt presents with nausea

## 2020-07-30 IMAGING — DX PORTABLE CHEST - 1 VIEW
1 series · 1 of 1 positions shown · non-contrast
Comparison: Radiographs November 17, 2018.

CLINICAL DATA: Unresponsive.

EXAM:
PORTABLE CHEST 1 VIEW

[chest ap]
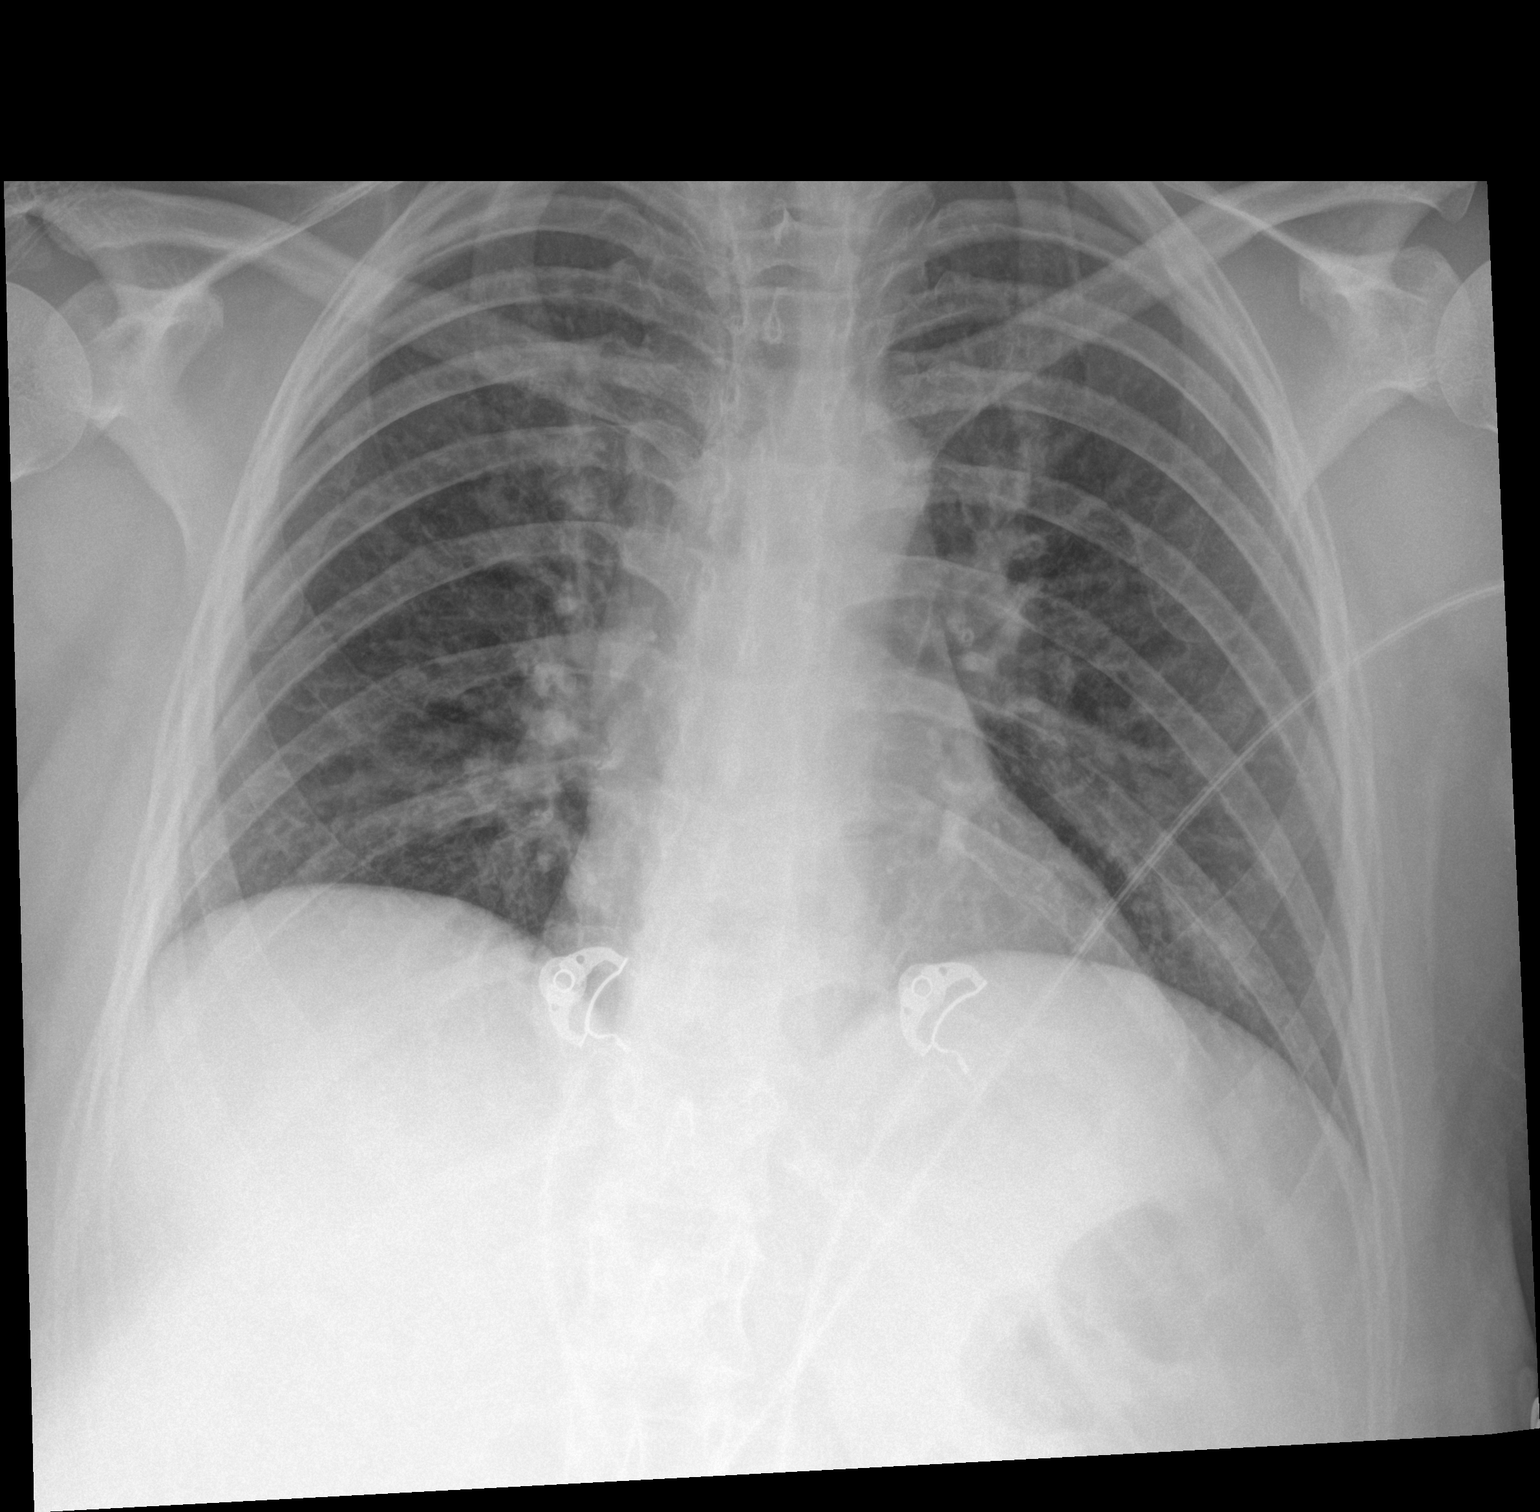

[1 of 1 positions shown; findings below may reference images not displayed]

FINDINGS: The heart size and mediastinal contours are within normal limits.
Both lungs are clear. The visualized skeletal structures are
unremarkable.
IMPRESSION: No active disease.

## 2020-09-11 ENCOUNTER — Encounter (HOSPITAL_BASED_OUTPATIENT_CLINIC_OR_DEPARTMENT_OTHER): Payer: Self-pay | Admitting: *Deleted

## 2020-09-11 ENCOUNTER — Other Ambulatory Visit: Payer: Self-pay

## 2020-09-11 ENCOUNTER — Emergency Department (HOSPITAL_BASED_OUTPATIENT_CLINIC_OR_DEPARTMENT_OTHER)
Admission: EM | Admit: 2020-09-11 | Discharge: 2020-09-11 | Disposition: A | Discharge status: Home / Self Care | Payer: Medicaid Other | Attending: Emergency Medicine | Admitting: Emergency Medicine

## 2020-09-11 DIAGNOSIS — Z79899 Other long term (current) drug therapy: Secondary | ICD-10-CM | POA: Diagnosis not present

## 2020-09-11 DIAGNOSIS — J45909 Unspecified asthma, uncomplicated: Secondary | ICD-10-CM | POA: Insufficient documentation

## 2020-09-11 DIAGNOSIS — F17210 Nicotine dependence, cigarettes, uncomplicated: Secondary | ICD-10-CM | POA: Diagnosis not present

## 2020-09-11 DIAGNOSIS — H109 Unspecified conjunctivitis: Secondary | ICD-10-CM

## 2020-09-11 DIAGNOSIS — H5789 Other specified disorders of eye and adnexa: Secondary | ICD-10-CM | POA: Diagnosis present

## 2020-09-11 MED ORDER — OFLOXACIN 0.3 % OP SOLN
1.0000 [drp] | Freq: Four times a day (QID) | OPHTHALMIC | Status: DC
  Administered 2020-09-11: 1 [drp] via OPHTHALMIC
  Filled 2020-09-11: qty 5

## 2020-09-11 MED ORDER — OFLOXACIN 0.3 % OP SOLN: 1.0000 [drp] | Freq: Four times a day (QID) | OPHTHALMIC | 0 refills | Status: AC

## 2020-09-11 NOTE — ED Notes (Signed)
Eye gtts administered per ED providers orders, pt teaching done when administering medication, discussed importance of handwashing and not allowing eye gtt med container touch eyes when applying eye gtts QID. RN applied med to one eye and pt returned demonstration in other eye. Opportunity for questions provided, copy of AVS provided and work note also provided.

## 2020-09-11 NOTE — ED Provider Notes (Signed)
[facility] EMERGENCY DEPARTMENT Provider Note   CSN: [id number] Arrival date & time: 09/11/20  2150     History Chief Complaint  Patient presents with  . Eye Problem    Betty Gomez is a 34 y.o. female.  States that her child has bacterial conjunctivitis is on eyedrops for.  Of last couple days she has had similar symptoms.  No change in vision aside from when she has "gunk" in her eyes.  No pain.  Only other complaint is sleepiness and skin rash.  Patient has multiple areas on her face where she has picked at her skin.  Review the records it does appear that she has a history of methamphetamine abuse and is likely related to that.  No systemic symptoms.   Eye Problem      Past Medical History:  Diagnosis Date  . Anxiety   . Asthma   . Depression    ok, came off meds, has someone she sees for this.  . Endometriosis   . Infection    UTI    Patient Active Problem List   Diagnosis Date Noted  . MDD (major depressive disorder), severe (HCC) 06/14/2019  . Drug overdose, intentional, initial encounter (HCC) 06/10/2019  . Severe recurrent major depression without psychotic features (HCC) 11/17/2018  . Bipolar disorder, unspecified (HCC) 11/17/2018  . Substance induced mood disorder (HCC) 11/17/2018  . Cannabis abuse 11/17/2018  . Methamphetamine abuse (HCC) 11/17/2018  . Opioid abuse (HCC) 11/17/2018  . Tobacco abuse 11/17/2018  . Polysubstance abuse (HCC) 11/17/2018  . Suicidal ideation 11/16/2018  . Depression with suicidal ideation 11/16/2018  . Alcohol abuse 11/16/2018  . 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 Benzodiazepine abuse (HCC) 09/21/2017  . Normal labor 09/20/2016  . Labor and delivery, indication for care 09/08/2016  . Decreased fetal movement 07/24/2016  . Pregnancy 09/15/2015  . Insufficient prenatal care 09/15/2015  . Indication for care in labor or delivery 09/10/2015  . Braxton Hicks  contractions 09/08/2015  . Candida albicans infection 08/16/2015  . Abnormal antenatal AFP screen 05/24/2015  . Irregular contractions 05/24/2015    Past Surgical History:  Procedure Laterality Date  . DIAGNOSTIC LAPAROSCOPY     endometriosis  . TUBAL LIGATION N/A 09/17/2015   Procedure: POST PARTUM TUBAL LIGATION;  Surgeon: Vena Austria, MD;  Location: [facility] ORS;  Service: Gynecology;  Laterality: N/A;  . TUBAL LIGATION Bilateral 09/21/2016   Procedure: POST PARTUM TUBAL LIGATION;  Surgeon: Nadara Mustard, MD;  Location: [facility] ORS;  Service: Gynecology;  Laterality: Bilateral;     OB History    Gravida  4   Para  4   Term  4   Preterm  0   AB  0   Living  4     SAB  0   TAB  0   Ectopic  0   Multiple  0   Live Births  4        Obstetric Comments  Pt states last infant contracted GBS even though pt treated in labor. Pt states infant caught it anyway- treated successfully. IF positive for GBS at 36 weeks this pregnancy - pt says plan is for C/S. 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 Hypertension Mother   . Diabetes Mother   . Heart disease Mother 47  failure  . Heart failure Mother   . Depression Father   . Heart disease Maternal Grandfather   . Diabetes Paternal Grandmother   . Hypertension Paternal Grandmother   . Cancer Paternal Grandfather        lung  . Hearing loss Neg Hx     Social History   Tobacco Use  . Smoking status: Heavy Tobacco Smoker    Packs/day: 0.50    Years: 10.00    Pack years: 5.00    Types: Cigarettes  . Smokeless tobacco: Never Used  Vaping Use  . Vaping Use: Some days  Substance Use Topics  . Alcohol use: No  . Drug use: Yes    Types: Marijuana, Methamphetamines, Heroin    Comment: heroin    Home Medications Prior to Admission medications   Medication Sig Start Date End Date Taking? Authorizing Provider  albuterol (VENTOLIN HFA) 108  (90 Base) MCG/ACT inhaler Inhale 2 puffs into the lungs every 6 (six) hours as needed for wheezing or shortness of breath. 06/18/19   Clapacs, Jackquline Denmark, MD  ARIPiprazole (ABILIFY) 15 MG tablet Take 15 mg by mouth at bedtime. 11/28/19   [provider]  FLUoxetine (PROZAC) 40 MG capsule Take 2 capsules (80 mg total) by mouth daily. 06/19/19   Clapacs, Jackquline Denmark, MD  gabapentin (NEURONTIN) 100 MG capsule Take 100 mg by mouth 3 (three) times daily. 11/28/19   [provider]  ketorolac (ACULAR) 0.5 % ophthalmic solution Place 1 drop into both eyes 4 (four) times daily. 12/01/19   Menshew, Charlesetta Ivory, PA-C  naloxone [facility]) nasal spray 4 mg/0.1 mL One spray for overdose 06/12/19   Alford Highland, MD  ofloxacin (OCUFLOX) 0.3 % ophthalmic solution Place 1 drop into both eyes 4 (four) times daily for 7 days. 09/11/20 09/18/20  Cozy Veale, Barbara Cower, MD  SUBOXONE 8-2 MG FILM Place 1 strip under the tongue daily. 11/26/19   [provider]  traZODone (DESYREL) 50 MG tablet Take 50 mg by mouth at bedtime. 11/28/19   [provider]    Allergies    Patient has no known allergies.  Review of Systems   Review of Systems  All other systems reviewed and are negative.   Physical Exam Updated Vital Signs BP 118/87   Pulse 78   Temp 98.5 F (36.9 C) (Oral)   Resp 18   Ht 5\' 5"  (1.651 m)   Wt 74.8 kg   SpO2 100%   BMI 27.46 kg/m   Physical Exam Vitals and nursing note reviewed.  Constitutional:      Appearance: She is well-developed.  HENT:     Head: Normocephalic and atraumatic.     Nose: No congestion or rhinorrhea.     Mouth/Throat:     Mouth: Mucous membranes are moist.     Pharynx: Oropharynx is clear.  Eyes:     Comments: No discharge this time mild conjunctivitis on the right minimal if any on the left.  Extraocular movements intact.  No proptosis.  Cardiovascular:     Rate and Rhythm: Normal rate and regular rhythm.  Pulmonary:     Effort: No respiratory  distress.     Breath sounds: No stridor.  Abdominal:     General: There is no distension.  Musculoskeletal:        General: No swelling or tenderness. Normal range of motion.     Cervical back: Normal range of motion.  Skin:    General: Skin is warm and dry.  Comments: apProximately 20-25 open wounds on her face.  Neurological:     General: No focal deficit present.     Mental Status: She is alert.     ED Results / Procedures / Treatments   Labs (all labs ordered are listed, but only abnormal results are displayed) Labs Reviewed - No data to display  EKG None  Radiology No results found.  Procedures Procedures (including critical care time)  Medications Ordered in ED Medications  ofloxacin (OCUFLOX) 0.3 % ophthalmic solution 1 drop (1 drop Both Eyes Given 09/11/20 2335)    ED Course  I have reviewed the triage vital signs and the nursing notes.  Pertinent labs & imaging results that were available during my care of the patient were reviewed by me and considered in my medical decision making (see chart for details).    MDM Rules/Calculators/A&P                          Likely viral versus allergic etiology for her symptoms however patient is adamant that it is bacterial and wants eyedrops so we will prescribe them.  As far as the rash on her face go to think is related to the methamphetamine abuse.  I encouraged cessation and follow-up with her PCP if it persists.  Final Clinical Impression(s) / ED Diagnoses Final diagnoses:  Conjunctivitis of both eyes, unspecified conjunctivitis type    Rx / DC Orders ED Discharge Orders         Ordered    ofloxacin (OCUFLOX) 0.3 % ophthalmic solution  4 times daily        09/11/20 2323           Shavy Beachem, Barbara Cower, MD 09/11/20 2344

## 2020-09-11 NOTE — ED Notes (Signed)
Presents with Rt eye redness and states having greenish drainage from Rt eye as well, states son has [employer] currently. Visual Acuity performed and documented

## 2020-09-11 NOTE — ED Triage Notes (Signed)
Pt c/o bil eye redness x 2 days , son at home with pink eye

## 2020-11-13 ENCOUNTER — Other Ambulatory Visit: Payer: Self-pay

## 2020-11-13 ENCOUNTER — Encounter (HOSPITAL_BASED_OUTPATIENT_CLINIC_OR_DEPARTMENT_OTHER): Payer: Self-pay | Admitting: Emergency Medicine

## 2020-11-13 DIAGNOSIS — F1590 Other stimulant use, unspecified, uncomplicated: Secondary | ICD-10-CM | POA: Diagnosis not present

## 2020-11-13 DIAGNOSIS — J45909 Unspecified asthma, uncomplicated: Secondary | ICD-10-CM | POA: Diagnosis not present

## 2020-11-13 DIAGNOSIS — K0889 Other specified disorders of teeth and supporting structures: Secondary | ICD-10-CM | POA: Insufficient documentation

## 2020-11-13 DIAGNOSIS — R1110 Vomiting, unspecified: Secondary | ICD-10-CM | POA: Insufficient documentation

## 2020-11-13 DIAGNOSIS — F17210 Nicotine dependence, cigarettes, uncomplicated: Secondary | ICD-10-CM | POA: Insufficient documentation

## 2020-11-13 DIAGNOSIS — K011 Impacted teeth: Secondary | ICD-10-CM | POA: Diagnosis not present

## 2020-11-13 NOTE — ED Triage Notes (Signed)
Patient complains of right lower dental pain; states headache as well; states took ibuprofen and tylenol last dose 1400 today.

## 2020-11-14 ENCOUNTER — Emergency Department (HOSPITAL_BASED_OUTPATIENT_CLINIC_OR_DEPARTMENT_OTHER)
Admission: EM | Admit: 2020-11-14 | Discharge: 2020-11-14 | Disposition: A | Discharge status: Home / Self Care | Payer: Medicaid Other | Attending: Emergency Medicine | Admitting: Emergency Medicine

## 2020-11-14 DIAGNOSIS — K011 Impacted teeth: Secondary | ICD-10-CM

## 2020-11-14 DIAGNOSIS — K0889 Other specified disorders of teeth and supporting structures: Secondary | ICD-10-CM

## 2020-11-14 MED ORDER — HYDROCODONE-ACETAMINOPHEN 5-325 MG PO TABS: 1.0000 | ORAL_TABLET | ORAL | 0 refills | Status: DC | PRN

## 2020-11-14 MED ORDER — FLUCONAZOLE 150 MG PO TABS: ORAL_TABLET | ORAL | 0 refills | Status: DC

## 2020-11-14 MED ORDER — AMOXICILLIN 500 MG PO CAPS
500.0000 mg | ORAL_CAPSULE | Freq: Once | ORAL | Status: AC
  Administered 2020-11-14: 500 mg via ORAL
  Filled 2020-11-14: qty 1

## 2020-11-14 MED ORDER — AMOXICILLIN 500 MG PO CAPS: 500.0000 mg | ORAL_CAPSULE | Freq: Three times a day (TID) | ORAL | 0 refills | Status: DC

## 2020-11-14 MED ORDER — HYDROCODONE-ACETAMINOPHEN 5-325 MG PO TABS
1.0000 | ORAL_TABLET | Freq: Once | ORAL | Status: AC
  Administered 2020-11-14: 1 via ORAL
  Filled 2020-11-14: qty 1

## 2020-11-14 NOTE — ED Provider Notes (Signed)
[facility] Provider Note: Lowella Dell, MD, FACEP  CSN: [id number] MRN: [id number] ARRIVAL: 11/13/20 at 2309 ROOM: MH04/MH04   CHIEF COMPLAINT  Dental Pain   HISTORY OF PRESENT ILLNESS  11/14/20 4:07 AM Betty Gomez is a 34 y.o. female who has had her left third molars removed but still has her right third molars.  Her right lower third molar is erupting and it began hurting yesterday.  It hurt so badly it made her vomit and gave her a headache.  She believes it may be infected.  The pain associated with it is a 6 out of 10 presently, aching in nature.  It is worse with eating or drinking.  She has taken ibuprofen and Tylenol without adequate relief of the pain.   Past Medical History:  Diagnosis Date  . Anxiety   . Asthma   . Depression    ok, came off meds, has someone she sees for this.  . Endometriosis   . Infection    UTI    Past Surgical History:  Procedure Laterality Date  . DIAGNOSTIC LAPAROSCOPY     endometriosis  . TUBAL LIGATION N/A 09/17/2015   Procedure: POST PARTUM TUBAL LIGATION;  Surgeon: Vena Austria, MD;  Location: [facility] ORS;  Service: Gynecology;  Laterality: N/A;  . TUBAL LIGATION Bilateral 09/21/2016   Procedure: POST PARTUM TUBAL LIGATION;  Surgeon: Nadara Mustard, MD;  Location: [facility] OR[facility];  Service: Gynecology;  Laterality: Bilateral;    Family History  Problem Relation Age of Onset  . Hypertension Mother   . Diabetes Mother   . Heart disease Mother 30       failure  . Heart failure Mother   . Depression Father   . Heart disease Maternal Grandfather   . Diabetes Paternal Grandmother   . Hypertension Paternal Grandmother   . Cancer Paternal Grandfather        lung  . Hearing loss Neg Hx     Social History   Tobacco Use  . Smoking status: Heavy Tobacco Smoker    Packs/day: 0.50    Years: 10.00    Pack years: 5.00    Types: Cigarettes  . Smokeless tobacco: Never Used  Vaping Use  . Vaping Use: Some days   Substance Use Topics  . Alcohol use: No  . Drug use: Yes    Types: Marijuana, Methamphetamines, Heroin    Comment: heroin    Prior to Admission medications   Medication Sig Start Date End Date Taking? Authorizing Provider  albuterol (VENTOLIN HFA) 108 (90 Base) MCG/ACT inhaler Inhale 2 puffs into the lungs every 6 (six) hours as needed for wheezing or shortness of breath. 06/18/19   Clapacs, Jackquline Denmark, MD  ARIPiprazole (ABILIFY) 15 MG tablet Take 15 mg by mouth at bedtime. 11/28/19   [provider]  FLUoxetine (PROZAC) 40 MG capsule Take 2 capsules (80 mg total) by mouth daily. 06/19/19   Clapacs, Jackquline Denmark, MD  gabapentin (NEURONTIN) 100 MG capsule Take 100 mg by mouth 3 (three) times daily. 11/28/19   [provider]  ketorolac (ACULAR) 0.5 % ophthalmic solution Place 1 drop into both eyes 4 (four) times daily. 12/01/19   Menshew, Charlesetta Ivory, PA-C  naloxone [facility]) nasal spray 4 mg/0.1 mL One spray for overdose 06/12/19   Alford Highland, MD  traZODone (DESYREL) 50 MG tablet Take 50 mg by mouth at bedtime. 11/28/19   [provider]    Allergies Patient has no known allergies.  REVIEW OF SYSTEMS  Negative except as noted here or in the History of Present Illness.   PHYSICAL EXAMINATION  Initial Vital Signs Blood pressure 125/78, pulse (!) 101, temperature 98.6 F (37 C), temperature source Oral, resp. rate 18, height 5\' 5"  (1.651 m), weight 74.8 kg, last menstrual period 10/17/2020, SpO2 98 %, unknown if currently breastfeeding.  Examination General: Well-developed, well-nourished female in no acute distress; appearance consistent with age of record HENT: normocephalic; atraumatic; partially erupted, impacted right lower third molar with tenderness to percussion Eyes: pupils equal, round and reactive to light; extraocular muscles intact Neck: supple; right anterior cervical lymphadenopathy Heart: regular rate and rhythm Lungs: clear to auscultation  bilaterally Abdomen: soft; nondistended; nontender; bowel sounds present Extremities: No deformity; full range of motion Neurologic: Awake, alert and oriented; motor function intact in all extremities and symmetric; no facial droop Skin: Warm and dry Psychiatric: Normal mood and affect   RESULTS  Summary of this visit's results, reviewed and interpreted by myself:   EKG Interpretation  Date/Time:    Ventricular Rate:    PR Interval:    QRS Duration:   QT Interval:    QTC Calculation:   R Axis:     Text Interpretation:        Laboratory Studies: No results found for this or any previous visit (from the past 24 hour(s)). Imaging Studies: No results found.  ED COURSE and MDM  Nursing notes, initial and subsequent vitals signs, including pulse oximetry, reviewed and interpreted by myself.  Vitals:   11/13/20 2329 11/13/20 2333  BP:  125/78  Pulse:  (!) 101  Resp:  18  Temp:  98.6 F (37 C)  TempSrc:  Oral  SpO2:  98%  Weight: 74.8 kg   Height: 5\' 5"  (1.651 m)    Medications  amoxicillin (AMOXIL) capsule 500 mg (has no administration in time range)  HYDROcodone-acetaminophen (NORCO/VICODIN) 5-325 MG per tablet 1 tablet (has no administration in time range)    We will treat possible infection with amoxicillin and refer to oral surgery.  PROCEDURES  Procedures   ED DIAGNOSES     ICD-10-CM   1. Pain, dental  K08.89   2. Impacted third molar tooth  K01.1        Deshay Blumenfeld, MD 11/14/20 0425

## 2020-11-14 NOTE — ED Provider Notes (Signed)
[facility] Provider Note: Lowella Dell, MD, FACEP  CSN: [id number] MRN: [id number] ARRIVAL: 11/13/20 at 2309 ROOM: MH04/MH04   CHIEF COMPLAINT  Dental Pain   HISTORY OF PRESENT ILLNESS  11/14/20 4:07 AM Sanda Klein Fontanella is a 34 y.o. female who has had her left third molars removed but still has her right third molars.  Her right lower third molar is erupting and it began hurting yesterday.  It hurt so badly it made her vomit and gave her a headache.  She believes it may be infected.  The pain associated with it is a 6 out of 10 presently, aching in nature.  It is worse with eating or drinking.  She has taken ibuprofen and Tylenol without adequate relief of the pain.   Past Medical History:  Diagnosis Date  . Anxiety   . Asthma   . Depression    ok, came off meds, has someone she sees for this.  . Endometriosis   . Infection    UTI    Past Surgical History:  Procedure Laterality Date  . DIAGNOSTIC LAPAROSCOPY     endometriosis  . TUBAL LIGATION N/A 09/17/2015   Procedure: POST PARTUM TUBAL LIGATION;  Surgeon: Vena Austria, MD;  Location: [facility] ORS;  Service: Gynecology;  Laterality: N/A;  . TUBAL LIGATION Bilateral 09/21/2016   Procedure: POST PARTUM TUBAL LIGATION;  Surgeon: Nadara Mustard, MD;  Location: [facility] OR[facility];  Service: Gynecology;  Laterality: Bilateral;    Family History  Problem Relation Age of Onset  . Hypertension Mother   . Diabetes Mother   . Heart disease Mother 25       failure  . Heart failure Mother   . Depression Father   . Heart disease Maternal Grandfather   . Diabetes Paternal Grandmother   . Hypertension Paternal Grandmother   . Cancer Paternal Grandfather        lung  . Hearing loss Neg Hx     Social History   Tobacco Use  . Smoking status: Heavy Tobacco Smoker    Packs/day: 0.50    Years: 10.00    Pack years: 5.00    Types: Cigarettes  . Smokeless tobacco: Never Used  Vaping Use  . Vaping Use: Some days   Substance Use Topics  . Alcohol use: No  . Drug use: Yes    Types: Marijuana, Methamphetamines, Heroin    Comment: heroin    Prior to Admission medications   Medication Sig Start Date End Date Taking? Authorizing Provider  albuterol (VENTOLIN HFA) 108 (90 Base) MCG/ACT inhaler Inhale 2 puffs into the lungs every 6 (six) hours as needed for wheezing or shortness of breath. 06/18/19   Clapacs, Jackquline Denmark, MD  ARIPiprazole (ABILIFY) 15 MG tablet Take 15 mg by mouth at bedtime. 11/28/19   [provider]  FLUoxetine (PROZAC) 40 MG capsule Take 2 capsules (80 mg total) by mouth daily. 06/19/19   Clapacs, Jackquline Denmark, MD  gabapentin (NEURONTIN) 100 MG capsule Take 100 mg by mouth 3 (three) times daily. 11/28/19   [provider]  ketorolac (ACULAR) 0.5 % ophthalmic solution Place 1 drop into both eyes 4 (four) times daily. 12/01/19   Menshew, Charlesetta Ivory, PA-C  naloxone [facility]) nasal spray 4 mg/0.1 mL One spray for overdose 06/12/19   Alford Highland, MD  traZODone (DESYREL) 50 MG tablet Take 50 mg by mouth at bedtime. 11/28/19   [provider]    Allergies Patient has no known allergies.  REVIEW OF SYSTEMS  Negative except as noted here or in the History of Present Illness.   PHYSICAL EXAMINATION  Initial Vital Signs Blood pressure 125/78, pulse (!) 101, temperature 98.6 F (37 C), temperature source Oral, resp. rate 18, height 5\' 5"  (1.651 m), weight 74.8 kg, last menstrual period 10/17/2020, SpO2 98 %, unknown if currently breastfeeding.  Examination General: Well-developed, well-nourished female in no acute distress; appearance consistent with age of record HENT: normocephalic; atraumatic; partially erupted, impacted right lower third molar with tenderness to percussion Eyes: pupils equal, round and reactive to light; extraocular muscles intact Neck: supple; right anterior cervical lymphadenopathy Heart: regular rate and rhythm Lungs: clear to auscultation  bilaterally Abdomen: soft; nondistended; nontender; bowel sounds present Extremities: No deformity; full range of motion Neurologic: Awake, alert and oriented; motor function intact in all extremities and symmetric; no facial droop Skin: Warm and dry Psychiatric: Normal mood and affect   RESULTS  Summary of this visit's results, reviewed and interpreted by myself:   EKG Interpretation  Date/Time:    Ventricular Rate:    PR Interval:    QRS Duration:   QT Interval:    QTC Calculation:   R Axis:     Text Interpretation:        Laboratory Studies: No results found for this or any previous visit (from the past 24 hour(s)). Imaging Studies: No results found.  ED COURSE and MDM  Nursing notes, initial and subsequent vitals signs, including pulse oximetry, reviewed and interpreted by myself.  Vitals:   11/13/20 2329 11/13/20 2333  BP:  125/78  Pulse:  (!) 101  Resp:  18  Temp:  98.6 F (37 C)  TempSrc:  Oral  SpO2:  98%  Weight: 74.8 kg   Height: 5\' 5"  (1.651 m)    Medications  amoxicillin (AMOXIL) capsule 500 mg (has no administration in time range)  HYDROcodone-acetaminophen (NORCO/VICODIN) 5-325 MG per tablet 1 tablet (has no administration in time range)    We will treat possible infection with amoxicillin and refer to oral sugery.  PROCEDURES  Procedures   ED DIAGNOSES     ICD-10-CM   1. Pain, dental  K08.89   2. Impacted third molar tooth  K01.1        Breezy Hertenstein, MD 11/14/20 [phone]

## 2020-11-18 ENCOUNTER — Emergency Department (HOSPITAL_BASED_OUTPATIENT_CLINIC_OR_DEPARTMENT_OTHER)
Admission: EM | Admit: 2020-11-18 | Discharge: 2020-11-18 | Disposition: A | Discharge status: Home / Self Care | Payer: Medicaid Other | Attending: Emergency Medicine | Admitting: Emergency Medicine

## 2020-11-18 ENCOUNTER — Encounter (HOSPITAL_BASED_OUTPATIENT_CLINIC_OR_DEPARTMENT_OTHER): Payer: Self-pay | Admitting: *Deleted

## 2020-11-18 ENCOUNTER — Emergency Department (HOSPITAL_BASED_OUTPATIENT_CLINIC_OR_DEPARTMENT_OTHER): Payer: Medicaid Other

## 2020-11-18 ENCOUNTER — Other Ambulatory Visit: Payer: Self-pay

## 2020-11-18 DIAGNOSIS — Z7951 Long term (current) use of inhaled steroids: Secondary | ICD-10-CM | POA: Diagnosis not present

## 2020-11-18 DIAGNOSIS — F1590 Other stimulant use, unspecified, uncomplicated: Secondary | ICD-10-CM | POA: Insufficient documentation

## 2020-11-18 DIAGNOSIS — J45909 Unspecified asthma, uncomplicated: Secondary | ICD-10-CM | POA: Diagnosis not present

## 2020-11-18 DIAGNOSIS — F17210 Nicotine dependence, cigarettes, uncomplicated: Secondary | ICD-10-CM | POA: Diagnosis not present

## 2020-11-18 DIAGNOSIS — Y990 Civilian activity done for income or pay: Secondary | ICD-10-CM | POA: Diagnosis not present

## 2020-11-18 DIAGNOSIS — S6992XA Unspecified injury of left wrist, hand and finger(s), initial encounter: Secondary | ICD-10-CM | POA: Diagnosis present

## 2020-11-18 DIAGNOSIS — S63502A Unspecified sprain of left wrist, initial encounter: Secondary | ICD-10-CM | POA: Insufficient documentation

## 2020-11-18 DIAGNOSIS — W010XXA Fall on same level from slipping, tripping and stumbling without subsequent striking against object, initial encounter: Secondary | ICD-10-CM | POA: Diagnosis not present

## 2020-11-18 NOTE — ED Provider Notes (Signed)
[facility] EMERGENCY DEPARTMENT Provider Note   CSN: [id number] Arrival date & time: 11/18/20  2033     History Chief Complaint  Patient presents with  . Wrist Injury    Betty Gomez is a 34 y.o. female.  34 yo F with a cc of left wrist pain. The patient was at work about a week ago when she stumbled while trying to load a truck and her left hand reached up to catch her from falling and was on a conveyor belts. She feels like she must of hurt her wrist then though didn't feel any significant pain. That evening when she was going to bed she realized that her wrist was bothering her. Mostly to the ulnar aspect. Worse with certain positions and bearing weight. Was at work today and was trying to load a truck and her supervisor realized she was having wrist pain and told her she needs to get medical clearance to come back to work. She denies any pain to the elbow.  The history is provided by the patient.  Wrist Injury Location:  Wrist Wrist location:  L wrist Injury: yes   Time since incident:  5 days Mechanism of injury comment:  Caught herself with that hand Pain details:    Quality:  Aching   Radiates to:  Does not radiate   Severity:  Moderate   Onset quality:  Gradual   Duration:  5 days   Timing:  Constant   Progression:  Resolved Handedness:  Right-handed Dislocation: no   Prior injury to area:  Yes Relieved by:  Nothing Worsened by:  Bearing weight, exercise and movement Ineffective treatments:  None tried Associated symptoms: no fever        Past Medical History:  Diagnosis Date  . Anxiety   . Asthma   . Depression    ok, came off meds, has someone she sees for this.  . Endometriosis   . Infection    UTI    Patient Active Problem List   Diagnosis Date Noted  . MDD (major depressive disorder), severe (HCC) 06/14/2019  . Drug overdose, intentional, initial encounter (HCC) 06/10/2019  . Severe recurrent major depression without psychotic  features (HCC) 11/17/2018  . Bipolar disorder, unspecified (HCC) 11/17/2018  . Substance induced mood disorder (HCC) 11/17/2018  . Cannabis abuse 11/17/2018  . Methamphetamine abuse (HCC) 11/17/2018  . Opioid abuse (HCC) 11/17/2018  . Tobacco abuse 11/17/2018  . Polysubstance abuse (HCC) 11/17/2018  . Suicidal ideation 11/16/2018  . Depression with suicidal ideation 11/16/2018  . Alcohol abuse 11/16/2018  . 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 Benzodiazepine abuse (HCC) 09/21/2017  . Normal labor 09/20/2016  . Labor and delivery, indication for care 09/08/2016  . Decreased fetal movement 07/24/2016  . Pregnancy 09/15/2015  . Insufficient prenatal care 09/15/2015  . Indication for care in labor or delivery 09/10/2015  . Braxton Hicks contractions 09/08/2015  . Candida albicans infection 08/16/2015  . Abnormal antenatal AFP screen 05/24/2015  . Irregular contractions 05/24/2015    Past Surgical History:  Procedure Laterality Date  . DIAGNOSTIC LAPAROSCOPY     endometriosis  . TUBAL LIGATION N/A 09/17/2015   Procedure: POST PARTUM TUBAL LIGATION;  Surgeon: Vena Austria, MD;  Location: [facility] ORS;  Service: Gynecology;  Laterality: N/A;  . TUBAL LIGATION Bilateral 09/21/2016   Procedure: POST PARTUM TUBAL LIGATION;  Surgeon: Nadara Mustard, MD;  Location: [facility] ORS;  Service: Gynecology;  Laterality: Bilateral;  OB History    Gravida  4   Para  4   Term  4   Preterm  0   AB  0   Living  4     SAB  0   IAB  0   Ectopic  0   Multiple  0   Live Births  4        Obstetric Comments  Pt states last infant contracted GBS even though pt treated in labor. Pt states infant caught it anyway- treated successfully. IF positive for GBS at 36 weeks this pregnancy - pt says plan is for C/S. 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 Hypertension Mother    . Diabetes Mother   . Heart disease Mother 85       failure  . Heart failure Mother   . Depression Father   . Heart disease Maternal Grandfather   . Diabetes Paternal Grandmother   . Hypertension Paternal Grandmother   . Cancer Paternal Grandfather        lung  . Hearing loss Neg Hx     Social History   Tobacco Use  . Smoking status: Heavy Tobacco Smoker    Packs/day: 0.50    Years: 10.00    Pack years: 5.00    Types: Cigarettes  . Smokeless tobacco: Never Used  Vaping Use  . Vaping Use: Some days  Substance Use Topics  . Alcohol use: No  . Drug use: Yes    Types: Marijuana, Methamphetamines, Heroin    Comment: heroin    Home Medications Prior to Admission medications   Medication Sig Start Date End Date Taking? Authorizing Provider  albuterol (VENTOLIN HFA) 108 (90 Base) MCG/ACT inhaler Inhale 2 puffs into the lungs every 6 (six) hours as needed for wheezing or shortness of breath. 06/18/19   Clapacs, Jackquline Denmark, MD  amoxicillin (AMOXIL) 500 MG capsule Take 1 capsule (500 mg total) by mouth 3 (three) times daily. 11/14/20   Molpus, John, MD  ARIPiprazole (ABILIFY) 15 MG tablet Take 15 mg by mouth at bedtime. 11/28/19   [provider]  fluconazole (DIFLUCAN) 150 MG tablet Take 1 tablet as needed for vaginal yeast infection.  May repeat in 3 days if symptoms persist. 11/14/20   Molpus, Jonny Ruiz, MD  FLUoxetine (PROZAC) 40 MG capsule Take 2 capsules (80 mg total) by mouth daily. 06/19/19   Clapacs, Jackquline Denmark, MD  gabapentin (NEURONTIN) 100 MG capsule Take 100 mg by mouth 3 (three) times daily. 11/28/19   [provider]  HYDROcodone-acetaminophen (NORCO) 5-325 MG tablet Take 1 tablet by mouth every 4 (four) hours as needed for severe pain. 11/14/20   Molpus, John, MD  ketorolac (ACULAR) 0.5 % ophthalmic solution Place 1 drop into both eyes 4 (four) times daily. 12/01/19   Menshew, Charlesetta Ivory, PA-C  naloxone [facility]) nasal spray 4 mg/0.1 mL One spray for overdose  06/12/19   Alford Highland, MD  traZODone (DESYREL) 50 MG tablet Take 50 mg by mouth at bedtime. 11/28/19   [provider]    Allergies    Patient has no known allergies.  Review of Systems   Review of Systems  Constitutional: Negative for chills and fever.  HENT: Negative for congestion and rhinorrhea.   Eyes: Negative for redness and visual disturbance.  Respiratory: Negative for shortness of breath and wheezing.   Cardiovascular: Negative  for chest pain and palpitations.  Gastrointestinal: Negative for nausea and vomiting.  Genitourinary: Negative for dysuria and urgency.  Musculoskeletal: Positive for arthralgias. Negative for myalgias.  Skin: Negative for pallor and wound.  Neurological: Negative for dizziness and headaches.    Physical Exam Updated Vital Signs BP 124/87   Pulse 99   Temp 98.1 F (36.7 C) (Oral)   Resp 20   Ht 5\' 5"  (1.651 m)   Wt 74.8 kg   LMP 11/18/2020   SpO2 99%   BMI 27.44 kg/m   Physical Exam Vitals and nursing note reviewed.  Constitutional:      General: She is not in acute distress.    Appearance: She is well-developed and well-nourished. She is not diaphoretic.  HENT:     Head: Normocephalic and atraumatic.  Eyes:     Extraocular Movements: EOM normal.     Pupils: Pupils are equal, round, and reactive to light.  Cardiovascular:     Rate and Rhythm: Normal rate and regular rhythm.     Heart sounds: No murmur heard. No friction rub. No gallop.   Pulmonary:     Effort: Pulmonary effort is normal.     Breath sounds: No wheezing or rales.  Abdominal:     General: There is no distension.     Palpations: Abdomen is soft.     Tenderness: There is no abdominal tenderness.  Musculoskeletal:        General: Tenderness present. No edema.     Cervical back: Normal range of motion and neck supple.     Comments: Very mild tenderness to the ulnar styloid on the left. There is no obvious edema. Full range of motion of the wrist  without pain. No pain on the radial aspect of the wrist. No ligamentous laxity of the thumb. No pain to the elbow. Full range of motion of the elbow able to supinate and pronate without issue.  Skin:    General: Skin is warm and dry.  Neurological:     Mental Status: She is alert and oriented to person, place, and time.  Psychiatric:        Mood and Affect: Mood and affect normal.        Behavior: Behavior normal.     ED Results / Procedures / Treatments   Labs (all labs ordered are listed, but only abnormal results are displayed) Labs Reviewed - No data to display  EKG None  Radiology DG Wrist Complete Left  Result Date: 11/18/2020 CLINICAL DATA:  Fall pain on the medial side EXAM: LEFT WRIST - COMPLETE 3+ VIEW COMPARISON:  None. FINDINGS: There is no evidence of fracture or dislocation. There is no evidence of arthropathy or other focal bone abnormality. Soft tissues are unremarkable. IMPRESSION: Negative. Electronically Signed   By: 11/20/2020 M.D.   On: 11/18/2020 20:57    Procedures Procedures (including critical care time)  Medications Ordered in ED Medications - No data to display  ED Course  I have reviewed the triage vital signs and the nursing notes.  Pertinent labs & imaging results that were available during my care of the patient were reviewed by me and considered in my medical decision making (see chart for details).    MDM Rules/Calculators/A&P                          34 yo F with a chief complaint of left wrist pain. This occurred at work about  5 days ago. She needs a note to go back to work. Plain film viewed by me without fracture. Will place in a removable splint. PCP follow-up.  11:05 PM:  I have discussed the diagnosis/risks/treatment options with the patient and believe the pt to be eligible for discharge home to follow-up with PCP. We also discussed returning to the ED immediately if new or worsening sx occur. We discussed the sx which are most  concerning (e.g., sudden worsening pain, fever, inability to tolerate by mouth) that necessitate immediate return. Medications administered to the patient during their visit and any new prescriptions provided to the patient are listed below.  Medications given during this visit Medications - No data to display   The patient appears reasonably screen and/or stabilized for discharge and I doubt any other medical condition or other [facility] requiring further screening, evaluation, or treatment in the ED at this time prior to discharge.   Final Clinical Impression(s) / ED Diagnoses Final diagnoses:  Wrist sprain, left, initial encounter    Rx / DC Orders ED Discharge Orders    None       Melene PlanFloyd, Tripton Ned, DO 11/18/20 2305

## 2020-11-18 NOTE — Discharge Instructions (Signed)
Take 4 over the counter ibuprofen tablets 3 times a day or 2 over-the-counter naproxen tablets twice a day for pain. Also take tylenol 1000mg(2 extra strength) four times a day.    

## 2020-11-18 NOTE — ED Triage Notes (Signed)
Left wrist injury 5 days ago. She was catching herself from a fall.

## 2020-12-26 ENCOUNTER — Encounter (HOSPITAL_BASED_OUTPATIENT_CLINIC_OR_DEPARTMENT_OTHER): Payer: Self-pay | Admitting: *Deleted

## 2020-12-26 ENCOUNTER — Other Ambulatory Visit: Payer: Self-pay

## 2020-12-26 ENCOUNTER — Emergency Department (HOSPITAL_BASED_OUTPATIENT_CLINIC_OR_DEPARTMENT_OTHER)
Admission: EM | Admit: 2020-12-26 | Discharge: 2020-12-27 | Disposition: A | Discharge status: Home / Self Care | Payer: Medicaid Other | Attending: Emergency Medicine | Admitting: Emergency Medicine

## 2020-12-26 DIAGNOSIS — J45909 Unspecified asthma, uncomplicated: Secondary | ICD-10-CM | POA: Diagnosis not present

## 2020-12-26 DIAGNOSIS — K529 Noninfective gastroenteritis and colitis, unspecified: Secondary | ICD-10-CM | POA: Diagnosis not present

## 2020-12-26 DIAGNOSIS — R531 Weakness: Secondary | ICD-10-CM | POA: Insufficient documentation

## 2020-12-26 DIAGNOSIS — R5383 Other fatigue: Secondary | ICD-10-CM | POA: Diagnosis not present

## 2020-12-26 DIAGNOSIS — F17210 Nicotine dependence, cigarettes, uncomplicated: Secondary | ICD-10-CM | POA: Insufficient documentation

## 2020-12-26 DIAGNOSIS — Z7951 Long term (current) use of inhaled steroids: Secondary | ICD-10-CM | POA: Insufficient documentation

## 2020-12-26 DIAGNOSIS — R112 Nausea with vomiting, unspecified: Secondary | ICD-10-CM | POA: Diagnosis present

## 2020-12-26 HISTORY — DX: Other psychoactive substance abuse, uncomplicated: F19.10

## 2020-12-26 MED ORDER — LACTATED RINGERS IV BOLUS
1000.0000 mL | Freq: Once | INTRAVENOUS | Status: AC
  Administered 2020-12-27: 1000 mL via INTRAVENOUS

## 2020-12-26 MED ORDER — ONDANSETRON HCL 4 MG/2ML IJ SOLN
4.0000 mg | Freq: Once | INTRAMUSCULAR | Status: AC
  Administered 2020-12-27: 4 mg via INTRAVENOUS
  Filled 2020-12-26: qty 2

## 2020-12-26 NOTE — ED Provider Notes (Signed)
[facility] Provider Note: Lowella Dell, MD, FACEP  CSN: [id number] MRN: [id number] ARRIVAL: 12/26/20 at 2257 ROOM: MH09/MH09   CHIEF COMPLAINT  Vomiting   HISTORY OF PRESENT ILLNESS  12/26/20 11:36 PM Betty Gomez is a 35 y.o. female with a week of nausea, vomiting and diarrhea.  She has had some mild abdominal cramping with this but no significant pain.  She has not had any respiratory symptoms such as cough, shortness of breath or nasal congestion.  She has had generalized weakness and fatigue.  Symptoms are moderate.   Past Medical History:  Diagnosis Date  . Anxiety   . Asthma   . Depression    ok, came off meds, has someone she sees for this.  . Endometriosis   . Infection    UTI  . Substance abuse [facility])     Past Surgical History:  Procedure Laterality Date  . DIAGNOSTIC LAPAROSCOPY     endometriosis  . TUBAL LIGATION N/A 09/17/2015   Procedure: POST PARTUM TUBAL LIGATION;  Surgeon: Vena Austria, MD;  Location: [facility] ORS;  Service: Gynecology;  Laterality: N/A;  . TUBAL LIGATION Bilateral 09/21/2016   Procedure: POST PARTUM TUBAL LIGATION;  Surgeon: Nadara Mustard, MD;  Location: [facility] ORS;  Service: Gynecology;  Laterality: Bilateral;    Family History  Problem Relation Age of Onset  . Hypertension Mother   . Diabetes Mother   . Heart disease Mother 84       failure  . Heart failure Mother   . Depression Father   . Heart disease Maternal Grandfather   . Diabetes Paternal Grandmother   . Hypertension Paternal Grandmother   . Cancer Paternal Grandfather        lung  . Hearing loss Neg Hx     Social History   Tobacco Use  . Smoking status: Heavy Tobacco Smoker    Packs/day: 0.50    Years: 10.00    Pack years: 5.00    Types: Cigarettes  . Smokeless tobacco: Never Used  Vaping Use  . Vaping Use: Some days  Substance Use Topics  . Alcohol use: No  . Drug use: Yes    Types: Marijuana, Methamphetamines, Heroin, IV     Comment: heroin-last use June 2021    Prior to Admission medications   Medication Sig Start Date End Date Taking? Authorizing Provider  ondansetron (ZOFRAN ODT) 8 MG disintegrating tablet Take 1 tablet (8 mg total) by mouth every 8 (eight) hours as needed for nausea or vomiting. 12/27/20  Yes Daren Doswell, MD  albuterol (VENTOLIN HFA) 108 (90 Base) MCG/ACT inhaler Inhale 2 puffs into the lungs every 6 (six) hours as needed for wheezing or shortness of breath. 06/18/19   Clapacs, Jackquline Denmark, MD  ARIPiprazole (ABILIFY) 15 MG tablet Take 15 mg by mouth at bedtime. 11/28/19   [provider]  FLUoxetine (PROZAC) 40 MG capsule Take 2 capsules (80 mg total) by mouth daily. 06/19/19   Clapacs, Jackquline Denmark, MD  gabapentin (NEURONTIN) 100 MG capsule Take 100 mg by mouth 3 (three) times daily. 11/28/19   [provider]  ketorolac (ACULAR) 0.5 % ophthalmic solution Place 1 drop into both eyes 4 (four) times daily. 12/01/19   Menshew, Charlesetta Ivory, PA-C  naloxone [facility]) nasal spray 4 mg/0.1 mL One spray for overdose 06/12/19   Alford Highland, MD  traZODone (DESYREL) 50 MG tablet Take 50 mg by mouth at bedtime. 11/28/19   [provider]  Allergies Patient has no known allergies.   REVIEW OF SYSTEMS  Negative except as noted here or in the History of Present Illness.   PHYSICAL EXAMINATION  Initial Vital Signs Blood pressure 128/82, pulse (!) 117, temperature 97.6 F (36.4 C), temperature source Oral, resp. rate 18, height 5\' 5"  (1.651 m), weight 68 kg, SpO2 100 %, unknown if currently breastfeeding.  Examination General: Well-developed, well-nourished female in no acute distress; appearance consistent with age of record HENT: normocephalic; atraumatic Eyes: pupils equal, round and reactive to light; extraocular muscles intact Neck: supple Heart: regular rate and rhythm; tachycardia Lungs: clear to auscultation bilaterally Abdomen: soft; nondistended; nontender; no masses  or hepatosplenomegaly; bowel sounds present Extremities: No deformity; full range of motion; pulses normal Neurologic: Awake, alert and oriented; motor function intact in all extremities and symmetric; no facial droop Skin: Warm and dry Psychiatric: Normal mood and affect   RESULTS  Summary of this visit's results, reviewed and interpreted by myself:   EKG Interpretation  Date/Time:    Ventricular Rate:    PR Interval:    QRS Duration:   QT Interval:    QTC Calculation:   R Axis:     Text Interpretation:        Laboratory Studies: Results for orders placed or performed during the hospital encounter of 12/26/20 (from the past 24 hour(s))  CBC with Differential/Platelet     Status: None   Collection Time: 12/27/20 12:06 AM  Result Value Ref Range   WBC 9.2 4.0 - 10.5 K/uL   RBC 4.50 3.87 - 5.11 MIL/uL   Hemoglobin 13.8 12.0 - 15.0 g/dL   HCT 12/29/20 80.9 - 98.3 %   MCV 90.7 80.0 - 100.0 fL   MCH 30.7 26.0 - 34.0 pg   MCHC 33.8 30.0 - 36.0 g/dL   RDW 38.2 50.5 - 39.7 %   Platelets 298 150 - 400 K/uL   nRBC 0.0 0.0 - 0.2 %   Neutrophils Relative % 73 %   Neutro Abs 6.8 1.7 - 7.7 K/uL   Lymphocytes Relative 20 %   Lymphs Abs 1.8 0.7 - 4.0 K/uL   Monocytes Relative 6 %   Monocytes Absolute 0.5 0.1 - 1.0 K/uL   Eosinophils Relative 1 %   Eosinophils Absolute 0.1 0.0 - 0.5 K/uL   Basophils Relative 0 %   Basophils Absolute 0.0 0.0 - 0.1 K/uL   Immature Granulocytes 0 %   Abs Immature Granulocytes 0.02 0.00 - 0.07 K/uL  Basic metabolic panel     Status: Abnormal   Collection Time: 12/27/20 12:06 AM  Result Value Ref Range   Sodium 137 135 - 145 mmol/L   Potassium 3.4 (L) 3.5 - 5.1 mmol/L   Chloride 103 98 - 111 mmol/L   CO2 23 22 - 32 mmol/L   Glucose, Bld 116 (H) 70 - 99 mg/dL   BUN 14 6 - 20 mg/dL   Creatinine, Ser 12/29/20 0.44 - 1.00 mg/dL   Calcium 9.4 8.9 - 4.19 mg/dL   GFR, Estimated 37.9 >02 mL/min   Anion gap 11 5 - 15   Imaging Studies: No results  found.  ED COURSE and MDM  Nursing notes, initial and subsequent vitals signs, including pulse oximetry, reviewed and interpreted by myself.  Vitals:   12/26/20 2308 12/26/20 2311  BP:  128/82  Pulse:  (!) 117  Resp:  18  Temp:  97.6 F (36.4 C)  TempSrc:  Oral  SpO2:  100%  Weight: 68  kg   Height: 5\' 5"  (1.651 m)    Medications  ondansetron (ZOFRAN) injection 4 mg (4 mg Intravenous Given 12/27/20 0023)  lactated ringers bolus 1,000 mL (1,000 mLs Intravenous New Bag/Given 12/27/20 0004)   12:48 AM Patient feeling better after IV fluids.  She does not wish a second bag of fluids.  Presentation consistent with a viral gastroenteritis.  PROCEDURES  Procedures   ED DIAGNOSES     ICD-10-CM   1. Gastroenteritis  K52.9        Miosotis Wetsel, MD 12/27/20 [phone]

## 2020-12-26 NOTE — ED Triage Notes (Signed)
N/V/D x 3-4 days.

## 2020-12-27 ENCOUNTER — Encounter (HOSPITAL_BASED_OUTPATIENT_CLINIC_OR_DEPARTMENT_OTHER): Payer: Self-pay | Admitting: Emergency Medicine

## 2020-12-27 LAB — BASIC METABOLIC PANEL
Anion gap: 11 (ref 5–15)
BUN: 14 mg/dL (ref 6–20)
CO2: 23 mmol/L (ref 22–32)
Calcium: 9.4 mg/dL (ref 8.9–10.3)
Chloride: 103 mmol/L (ref 98–111)
Creatinine, Ser: 0.85 mg/dL (ref 0.44–1.00)
GFR, Estimated: >60 mL/min (ref >60)
Glucose, Bld: 116 mg/dL — ABNORMAL HIGH (ref 70–99)
Potassium: 3.4 mmol/L — ABNORMAL LOW (ref 3.5–5.1)
Sodium: 137 mmol/L (ref 135–145)

## 2020-12-27 LAB — CBC WITH DIFFERENTIAL/PLATELET
Abs Immature Granulocytes: 0.02 10*3/uL (ref 0.00–0.07)
Basophils Absolute: 0 10*3/uL (ref 0.0–0.1)
Basophils Relative: 0 %
Eosinophils Absolute: 0.1 10*3/uL (ref 0.0–0.5)
Eosinophils Relative: 1 %
HCT: 40.8 % (ref 36.0–46.0)
Hemoglobin: 13.8 g/dL (ref 12.0–15.0)
Immature Granulocytes: 0 %
Lymphocytes Relative: 20 %
Lymphs Abs: 1.8 10*3/uL (ref 0.7–4.0)
MCH: 30.7 pg (ref 26.0–34.0)
MCHC: 33.8 g/dL (ref 30.0–36.0)
MCV: 90.7 fL (ref 80.0–100.0)
Monocytes Absolute: 0.5 10*3/uL (ref 0.1–1.0)
Monocytes Relative: 6 %
Neutro Abs: 6.8 10*3/uL (ref 1.7–7.7)
Neutrophils Relative %: 73 %
Platelets: 298 10*3/uL (ref 150–400)
RBC: 4.5 MIL/uL (ref 3.87–5.11)
RDW: 12.8 % (ref 11.5–15.5)
WBC: 9.2 10*3/uL (ref 4.0–10.5)
nRBC: 0 % (ref 0.0–0.2)

## 2020-12-27 MED ORDER — ONDANSETRON 8 MG PO TBDP: 8.0000 mg | ORAL_TABLET | Freq: Three times a day (TID) | ORAL | 0 refills | Status: AC | PRN

## 2021-01-12 ENCOUNTER — Emergency Department (HOSPITAL_BASED_OUTPATIENT_CLINIC_OR_DEPARTMENT_OTHER)
Admission: EM | Admit: 2021-01-12 | Discharge: 2021-01-12 | Disposition: A | Discharge status: Home / Self Care | Payer: Medicaid Other | Attending: Emergency Medicine | Admitting: Emergency Medicine

## 2021-01-12 ENCOUNTER — Encounter (HOSPITAL_BASED_OUTPATIENT_CLINIC_OR_DEPARTMENT_OTHER): Payer: Self-pay | Admitting: Emergency Medicine

## 2021-01-12 ENCOUNTER — Other Ambulatory Visit: Payer: Self-pay

## 2021-01-12 DIAGNOSIS — F17210 Nicotine dependence, cigarettes, uncomplicated: Secondary | ICD-10-CM | POA: Diagnosis not present

## 2021-01-12 DIAGNOSIS — J45909 Unspecified asthma, uncomplicated: Secondary | ICD-10-CM | POA: Insufficient documentation

## 2021-01-12 DIAGNOSIS — K0889 Other specified disorders of teeth and supporting structures: Secondary | ICD-10-CM | POA: Diagnosis present

## 2021-01-12 MED ORDER — AMOXICILLIN 500 MG PO CAPS: 500.0000 mg | ORAL_CAPSULE | Freq: Three times a day (TID) | ORAL | 0 refills | Status: AC

## 2021-01-12 NOTE — ED Triage Notes (Signed)
Pt reports right sided jaw pain from "wisdom tooth come in" and "it has a nasty smell to it"; pt reports pain started a week ago, has not been to dentist; pt reports she tolerates PO

## 2021-01-12 NOTE — ED Triage Notes (Signed)
Pt took prescribed cyclobenzaprine at 1600

## 2021-01-12 NOTE — ED Notes (Signed)
Pt. Was seen for a wisdom tooth irritation on the upper R quadrant of her mouth.

## 2021-01-12 NOTE — ED Provider Notes (Signed)
[facility] EMERGENCY DEPARTMENT Provider Note   CSN: [id number] Arrival date & time: 01/12/21  2135     History Chief Complaint  Patient presents with  . Dental Pain    Betty Gomez is a 35 y.o. female.  Patient presents ER chief complaint of right-sided upper posterior tooth pain.  She states symptoms been going off and on for 5 to 6 days.  Denies any fevers vomiting cough or diarrhea.  She feels like she has noted some increased swelling in that region as well.  She saw dentist 2 or 3 years ago but has not seen one since then.  She also has a history of polysubstance abuse.        Past Medical History:  Diagnosis Date  . Anxiety   . Asthma   . Depression    ok, came off meds, has someone she sees for this.  . Endometriosis   . Infection    UTI  . Substance abuse [facility]     Patient Active Problem List   Diagnosis Date Noted  . MDD (major depressive disorder), severe (HCC) 06/14/2019  . Drug overdose, intentional, initial encounter (HCC) 06/10/2019  . Severe recurrent major depression without psychotic features (HCC) 11/17/2018  . Bipolar disorder, unspecified (HCC) 11/17/2018  . Substance induced mood disorder (HCC) 11/17/2018  . Cannabis abuse 11/17/2018  . Methamphetamine abuse (HCC) 11/17/2018  . Opioid abuse (HCC) 11/17/2018  . Tobacco abuse 11/17/2018  . Polysubstance abuse (HCC) 11/17/2018  . Suicidal ideation 11/16/2018  . Depression with suicidal ideation 11/16/2018  . Alcohol abuse 11/16/2018  . 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 Benzodiazepine abuse (HCC) 09/21/2017  . Normal labor 09/20/2016  . Labor and delivery, indication for care 09/08/2016  . Decreased fetal movement 07/24/2016  . Pregnancy 09/15/2015  . Insufficient prenatal care 09/15/2015  . Indication for care in labor or delivery 09/10/2015  . Braxton Hicks contractions 09/08/2015  . Candida albicans infection 08/16/2015  .  Abnormal antenatal AFP screen 05/24/2015  . Irregular contractions 05/24/2015    Past Surgical History:  Procedure Laterality Date  . DIAGNOSTIC LAPAROSCOPY     endometriosis  . TUBAL LIGATION N/A 09/17/2015   Procedure: POST PARTUM TUBAL LIGATION;  Surgeon: Vena Austria, MD;  Location: [facility] ORS;  Service: Gynecology;  Laterality: N/A;  . TUBAL LIGATION Bilateral 09/21/2016   Procedure: POST PARTUM TUBAL LIGATION;  Surgeon: Nadara Mustard, MD;  Location: [facility] ORS;  Service: Gynecology;  Laterality: Bilateral;     OB History    Gravida  4   Para  4   Term  4   Preterm  0   AB  0   Living  4     SAB  0   IAB  0   Ectopic  0   Multiple  0   Live Births  4        Obstetric Comments  Pt states last infant contracted GBS even though pt treated in labor. Pt states infant caught it anyway- treated successfully. IF positive for GBS at 36 weeks this pregnancy - pt says plan is for C/S. 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 Hypertension Mother   . Diabetes Mother   . Heart disease Mother 27       failure  . Heart failure Mother   . Depression  Father   . Heart disease Maternal Grandfather   . Diabetes Paternal Grandmother   . Hypertension Paternal Grandmother   . Cancer Paternal Grandfather        lung  . Hearing loss Neg Hx     Social History   Tobacco Use  . Smoking status: Heavy Tobacco Smoker    Packs/day: 0.50    Years: 10.00    Pack years: 5.00    Types: Cigarettes  . Smokeless tobacco: Never Used  Vaping Use  . Vaping Use: Some days  Substance Use Topics  . Alcohol use: No  . Drug use: Yes    Types: Marijuana, Methamphetamines, Heroin, IV    Comment: heroin-last use June 2021    Home Medications Prior to Admission medications   Medication Sig Start Date End Date Taking? Authorizing Provider  amoxicillin (AMOXIL) 500 MG capsule Take 1 capsule (500 mg total) by mouth  3 (three) times daily for 7 days. 01/12/21 01/19/21 Yes Woodrow Drab, Eustace Moore, MD  albuterol (VENTOLIN HFA) 108 (90 Base) MCG/ACT inhaler Inhale 2 puffs into the lungs every 6 (six) hours as needed for wheezing or shortness of breath. 06/18/19   Clapacs, Jackquline Denmark, MD  ARIPiprazole (ABILIFY) 15 MG tablet Take 15 mg by mouth at bedtime. 11/28/19   [provider]  FLUoxetine (PROZAC) 40 MG capsule Take 2 capsules (80 mg total) by mouth daily. 06/19/19   Clapacs, Jackquline Denmark, MD  gabapentin (NEURONTIN) 100 MG capsule Take 100 mg by mouth 3 (three) times daily. 11/28/19   [provider]  ketorolac (ACULAR) 0.5 % ophthalmic solution Place 1 drop into both eyes 4 (four) times daily. 12/01/19   Menshew, Charlesetta Ivory, PA-C  naloxone [facility]) nasal spray 4 mg/0.1 mL One spray for overdose 06/12/19   Alford Highland, MD  ondansetron (ZOFRAN ODT) 8 MG disintegrating tablet Take 1 tablet (8 mg total) by mouth every 8 (eight) hours as needed for nausea or vomiting. 12/27/20   Molpus, John, MD  traZODone (DESYREL) 50 MG tablet Take 50 mg by mouth at bedtime. 11/28/19   [provider]    Allergies    Patient has no known allergies.  Review of Systems   Review of Systems  Constitutional: Negative for fever.  HENT: Negative for ear pain.   Eyes: Negative for pain.  Respiratory: Negative for cough.   Cardiovascular: Negative for chest pain.  Gastrointestinal: Negative for abdominal pain.  Genitourinary: Negative for flank pain.  Musculoskeletal: Negative for back pain.  Skin: Negative for rash.  Neurological: Negative for headaches.    Physical Exam Updated Vital Signs BP 121/85   Pulse (!) 122   Temp 98.1 F (36.7 C) (Oral)   Resp 19   Ht 5\' 5"  (1.651 m)   Wt 68 kg   SpO2 100%   BMI 24.96 kg/m   Physical Exam Constitutional:      General: She is not in acute distress.    Appearance: Normal appearance.  HENT:     Head: Normocephalic.     Comments: No abnormal facial swelling  noted.    Nose: Nose normal.     Mouth/Throat:     Comments: Tenderness palpation to the right upper posterior molar region.  No gumline abscess or fluctuance noted. Eyes:     Extraocular Movements: Extraocular movements intact.  Cardiovascular:     Rate and Rhythm: Normal rate.  Pulmonary:     Effort: Pulmonary effort is normal.  Musculoskeletal:  General: Normal range of motion.     Cervical back: Normal range of motion.  Neurological:     General: No focal deficit present.     Mental Status: She is alert. Mental status is at baseline.     ED Results / Procedures / Treatments   Labs (all labs ordered are listed, but only abnormal results are displayed) Labs Reviewed - No data to display  EKG None  Radiology No results found.  Procedures Procedures   Medications Ordered in ED Medications - No data to display  ED Course  I have reviewed the triage vital signs and the nursing notes.  Pertinent labs & imaging results that were available during my care of the patient were reviewed by me and considered in my medical decision making (see chart for details).    MDM Rules/Calculators/A&P                          Patient declined pain medications here today.  Clinically no evidence of abscess noted.  Patient prescribed antibiotics advised follow-up with dentistry within the week.  Advised return if she has fevers swelling worsening pain or any additional concerns.     Final Clinical Impression(s) / ED Diagnoses Final diagnoses:  Pain, dental    Rx / DC Orders ED Discharge Orders         Ordered    amoxicillin (AMOXIL) 500 MG capsule  3 times daily        01/12/21 2230           Cheryll Cockayne, MD 01/12/21 2230

## 2021-01-12 NOTE — Discharge Instructions (Addendum)
Call your primary care doctor or specialist as discussed in the next 2-3 days.   Return immediately back to the ER if:  Your symptoms worsen within the next 12-24 hours. You develop new symptoms such as new fevers, persistent vomiting, new pain, shortness of breath, or new weakness or numbness, or if you have any other concerns.  

## 2021-02-07 ENCOUNTER — Encounter (HOSPITAL_BASED_OUTPATIENT_CLINIC_OR_DEPARTMENT_OTHER): Payer: Self-pay | Admitting: *Deleted

## 2021-02-07 ENCOUNTER — Emergency Department (HOSPITAL_BASED_OUTPATIENT_CLINIC_OR_DEPARTMENT_OTHER)
Admission: EM | Admit: 2021-02-07 | Discharge: 2021-02-07 | Disposition: A | Discharge status: Home / Self Care | Payer: Medicaid Other | Attending: Emergency Medicine | Admitting: Emergency Medicine

## 2021-02-07 ENCOUNTER — Other Ambulatory Visit: Payer: Self-pay

## 2021-02-07 DIAGNOSIS — J45909 Unspecified asthma, uncomplicated: Secondary | ICD-10-CM | POA: Insufficient documentation

## 2021-02-07 DIAGNOSIS — F17200 Nicotine dependence, unspecified, uncomplicated: Secondary | ICD-10-CM | POA: Diagnosis not present

## 2021-02-07 DIAGNOSIS — R112 Nausea with vomiting, unspecified: Secondary | ICD-10-CM | POA: Insufficient documentation

## 2021-02-07 MED ORDER — ALBUTEROL SULFATE HFA 108 (90 BASE) MCG/ACT IN AERS: 1.0000 | INHALATION_SPRAY | Freq: Four times a day (QID) | RESPIRATORY_TRACT | 0 refills | Status: AC | PRN

## 2021-02-07 NOTE — ED Provider Notes (Signed)
[facility] EMERGENCY DEPARTMENT Provider Note   CSN: [id number] Arrival date & time: 02/07/21  2003     History Chief Complaint  Patient presents with  . Emesis    Betty Gomez is a 35 y.o. female.  Presents to ER with concern for nausea and vomiting.  Patient reports last night she had two [employer] from [employer] and thinks that she may have had food poisoning.  She woke up early in the morning with severe nausea and multiple episodes of vomiting.  States that throughout the day the nausea has subsided.  She has no ongoing nausea or vomiting.  She states that the [employer] that she just consumed sent very well in her stomach and she had no recurrence of her nausea or vomiting.  No fevers.  Does not want any lab work or imaging and only requesting work note.  HPI     Past Medical History:  Diagnosis Date  . Anxiety   . Asthma   . Depression    ok, came off meds, has someone she sees for this.  . Endometriosis   . Infection    UTI  . Substance abuse [facility])     Patient Active Problem List   Diagnosis Date Noted  . MDD (major depressive disorder), severe (HCC) 06/14/2019  . Drug overdose, intentional, initial encounter (HCC) 06/10/2019  . Severe recurrent major depression without psychotic features (HCC) 11/17/2018  . Bipolar disorder, unspecified (HCC) 11/17/2018  . Substance induced mood disorder (HCC) 11/17/2018  . Cannabis abuse 11/17/2018  . Methamphetamine abuse (HCC) 11/17/2018  . Opioid abuse (HCC) 11/17/2018  . Tobacco abuse 11/17/2018  . Polysubstance abuse (HCC) 11/17/2018  . Suicidal ideation 11/16/2018  . Depression with suicidal ideation 11/16/2018  . Alcohol abuse 11/16/2018  . 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 Benzodiazepine abuse (HCC) 09/21/2017  . Normal labor 09/20/2016  . Labor and delivery, indication for care 09/08/2016  . Decreased fetal movement 07/24/2016  . Pregnancy  09/15/2015  . Insufficient prenatal care 09/15/2015  . Indication for care in labor or delivery 09/10/2015  . Braxton Hicks contractions 09/08/2015  . Candida albicans infection 08/16/2015  . Abnormal antenatal AFP screen 05/24/2015  . Irregular contractions 05/24/2015    Past Surgical History:  Procedure Laterality Date  . DIAGNOSTIC LAPAROSCOPY     endometriosis  . TUBAL LIGATION N/A 09/17/2015   Procedure: POST PARTUM TUBAL LIGATION;  Surgeon: Vena Austria, MD;  Location: [facility] ORS;  Service: Gynecology;  Laterality: N/A;  . TUBAL LIGATION Bilateral 09/21/2016   Procedure: POST PARTUM TUBAL LIGATION;  Surgeon: Nadara Mustard, MD;  Location: [facility] ORS;  Service: Gynecology;  Laterality: Bilateral;     OB History    Gravida  4   Para  4   Term  4   Preterm  0   AB  0   Living  4     SAB  0   IAB  0   Ectopic  0   Multiple  0   Live Births  4        Obstetric Comments  Pt states last infant contracted GBS even though pt treated in labor. Pt states infant caught it anyway- treated successfully. IF positive for GBS at 36 weeks this pregnancy - pt says plan is for C/S. 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  Hypertension Mother   . Diabetes Mother   . Heart disease Mother 66       failure  . Heart failure Mother   . Depression Father   . Heart disease Maternal Grandfather   . Diabetes Paternal Grandmother   . Hypertension Paternal Grandmother   . Cancer Paternal Grandfather        lung  . Hearing loss Neg Hx     Social History   Tobacco Use  . Smoking status: Heavy Tobacco Smoker    Packs/day: 0.50    Years: 10.00    Pack years: 5.00    Types: Cigarettes  . Smokeless tobacco: Never Used  Vaping Use  . Vaping Use: Some days  Substance Use Topics  . Alcohol use: Not Currently  . Drug use: Yes    Types: Marijuana, Methamphetamines, Heroin, IV    Comment: heroin-last use  June 2021    Home Medications Prior to Admission medications   Medication Sig Start Date End Date Taking? Authorizing Provider  albuterol (VENTOLIN HFA) 108 (90 Base) MCG/ACT inhaler Inhale 1 puff into the lungs every 6 (six) hours as needed for wheezing or shortness of breath. 02/07/21   Milagros Loll, MD  ARIPiprazole (ABILIFY) 15 MG tablet Take 15 mg by mouth at bedtime. 11/28/19   [provider]  FLUoxetine (PROZAC) 40 MG capsule Take 2 capsules (80 mg total) by mouth daily. 06/19/19   Clapacs, Jackquline Denmark, MD  gabapentin (NEURONTIN) 100 MG capsule Take 100 mg by mouth 3 (three) times daily. 11/28/19   [provider]  ketorolac (ACULAR) 0.5 % ophthalmic solution Place 1 drop into both eyes 4 (four) times daily. 12/01/19   Menshew, Charlesetta Ivory, PA-C  naloxone [facility]) nasal spray 4 mg/0.1 mL One spray for overdose 06/12/19   Alford Highland, MD  ondansetron (ZOFRAN ODT) 8 MG disintegrating tablet Take 1 tablet (8 mg total) by mouth every 8 (eight) hours as needed for nausea or vomiting. 12/27/20   Molpus, John, MD  traZODone (DESYREL) 50 MG tablet Take 50 mg by mouth at bedtime. 11/28/19   [provider]    Allergies    Patient has no known allergies.  Review of Systems   Review of Systems  Constitutional: Negative for chills and fever.  HENT: Negative for ear pain and sore throat.   Eyes: Negative for pain and visual disturbance.  Respiratory: Negative for cough and shortness of breath.   Cardiovascular: Negative for chest pain and palpitations.  Gastrointestinal: Positive for abdominal pain, nausea and vomiting.  Genitourinary: Negative for dysuria and hematuria.  Musculoskeletal: Negative for arthralgias and back pain.  Skin: Negative for color change and rash.  Neurological: Negative for seizures and syncope.  All other systems reviewed and are negative.   Physical Exam Updated Vital Signs BP 104/70 (BP Location: Left Arm)   Pulse 77   Temp 98  F (36.7 C) (Oral)   Resp 18   Ht 5\' 5"  (1.651 m)   Wt 61.2 kg   LMP 01/08/2021 (Approximate)   SpO2 98%   Breastfeeding No   BMI 22.47 kg/m   Physical Exam Vitals and nursing note reviewed.  Constitutional:      General: She is not in acute distress.    Appearance: She is well-developed and well-nourished.  HENT:     Head: Normocephalic and atraumatic.  Eyes:     Conjunctiva/sclera: Conjunctivae normal.  Cardiovascular:     Rate and Rhythm: Normal rate and regular  rhythm.     Heart sounds: No murmur heard.   Pulmonary:     Effort: Pulmonary effort is normal. No respiratory distress.     Breath sounds: Normal breath sounds.  Abdominal:     Palpations: Abdomen is soft.     Tenderness: There is no abdominal tenderness.  Musculoskeletal:        General: No deformity, signs of injury or edema.     Cervical back: Neck supple.  Skin:    General: Skin is warm and dry.  Neurological:     General: No focal deficit present.     Mental Status: She is alert.  Psychiatric:        Mood and Affect: Mood and affect and mood normal.     ED Results / Procedures / Treatments   Labs (all labs ordered are listed, but only abnormal results are displayed) Labs Reviewed - No data to display  EKG None  Radiology No results found.  Procedures Procedures   Medications Ordered in ED Medications - No data to display  ED Course  I have reviewed the triage vital signs and the nursing notes.  Pertinent labs & imaging results that were available during my care of the patient were reviewed by me and considered in my medical decision making (see chart for details).    MDM Rules/Calculators/A&P                         35 year old lady presented to ER after having episode of nausea and vomiting earlier this morning.  She has no ongoing symptoms and she specifically requests not to have any labs or imaging completed this evening.  Her abdomen is soft and she has normal vital signs.  My  suspicion for acute abdominal process is very low.  Discharged home.  After the discussed management above, the patient was determined to be safe for discharge.  The patient was in agreement with this plan and all questions regarding their care were answered.  ED return precautions were discussed and the patient will return to the ED with any significant worsening of condition.    Final Clinical Impression(s) / ED Diagnoses Final diagnoses:  Nausea and vomiting, intractability of vomiting not specified, unspecified vomiting type    Rx / DC Orders ED Discharge Orders         Ordered    albuterol (VENTOLIN HFA) 108 (90 Base) MCG/ACT inhaler  Every 6 hours PRN        02/07/21 2133           Milagros Loll, MD 02/08/21 1502

## 2021-02-07 NOTE — Discharge Instructions (Addendum)
Follow-up with your primary care doctor.  Return to ER for any worsening symptoms.

## 2021-02-07 NOTE — ED Notes (Addendum)
In to assess pt, upon entering room, pt is drinking and eating. Informed would be starting an IV and obtaining lab work, Pt refuses IV and Lab work. "I do not need that done", explained rationale for IV and lab work, pt still refused. ED MD informed

## 2021-02-07 NOTE — ED Notes (Signed)
AVS, Work Note, Rx by ED provided to and given to client

## 2021-02-07 NOTE — ED Notes (Signed)
See EDP assessment 

## 2021-02-07 NOTE — ED Triage Notes (Signed)
Pt reports she has a cough, runny nose, with n/v/d today

## 2021-02-07 NOTE — ED Notes (Signed)
EDP at bedside  

## 2021-02-15 ENCOUNTER — Encounter (HOSPITAL_BASED_OUTPATIENT_CLINIC_OR_DEPARTMENT_OTHER): Payer: Self-pay | Admitting: *Deleted

## 2021-02-15 ENCOUNTER — Other Ambulatory Visit: Payer: Self-pay

## 2021-02-15 ENCOUNTER — Emergency Department (HOSPITAL_BASED_OUTPATIENT_CLINIC_OR_DEPARTMENT_OTHER)
Admission: EM | Admit: 2021-02-15 | Discharge: 2021-02-15 | Disposition: A | Discharge status: Home / Self Care | Payer: Medicaid Other | Attending: Emergency Medicine | Admitting: Emergency Medicine

## 2021-02-15 DIAGNOSIS — J019 Acute sinusitis, unspecified: Secondary | ICD-10-CM | POA: Diagnosis not present

## 2021-02-15 DIAGNOSIS — J45909 Unspecified asthma, uncomplicated: Secondary | ICD-10-CM | POA: Diagnosis not present

## 2021-02-15 DIAGNOSIS — R0981 Nasal congestion: Secondary | ICD-10-CM | POA: Diagnosis present

## 2021-02-15 DIAGNOSIS — F17210 Nicotine dependence, cigarettes, uncomplicated: Secondary | ICD-10-CM | POA: Insufficient documentation

## 2021-02-15 MED ORDER — AMOXICILLIN-POT CLAVULANATE 875-125 MG PO TABS: 1.0000 | ORAL_TABLET | Freq: Two times a day (BID) | ORAL | 0 refills | Status: AC

## 2021-02-15 NOTE — ED Provider Notes (Signed)
[facility] EMERGENCY DEPARTMENT Provider Note   CSN: [id number] Arrival date & time: 02/15/21  2300     History Chief Complaint  Patient presents with  . Facial Pain    Betty Gomez is a 35 y.o. female.  The history is provided by the patient.  URI Presenting symptoms: congestion, facial pain and rhinorrhea   Presenting symptoms: no cough and no fever   Severity:  Moderate Onset quality:  Gradual Duration:  8 days Timing:  Constant Progression:  Worsening Relieved by:  Nothing Worsened by:  Nothing Ineffective treatments:  None tried Associated symptoms: sinus pain   Associated symptoms: no arthralgias and no headaches        Past Medical History:  Diagnosis Date  . Anxiety   . Asthma   . Depression    ok, came off meds, has someone she sees for this.  . Endometriosis   . Infection    UTI  . Substance abuse [facility])     Patient Active Problem List   Diagnosis Date Noted  . MDD (major depressive disorder), severe (HCC) 06/14/2019  . Drug overdose, intentional, initial encounter (HCC) 06/10/2019  . Severe recurrent major depression without psychotic features (HCC) 11/17/2018  . Bipolar disorder, unspecified (HCC) 11/17/2018  . Substance induced mood disorder (HCC) 11/17/2018  . Cannabis abuse 11/17/2018  . Methamphetamine abuse (HCC) 11/17/2018  . Opioid abuse (HCC) 11/17/2018  . Tobacco abuse 11/17/2018  . Polysubstance abuse (HCC) 11/17/2018  . Suicidal ideation 11/16/2018  . Depression with suicidal ideation 11/16/2018  . Alcohol abuse 11/16/2018  . 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 Benzodiazepine abuse (HCC) 09/21/2017  . Normal labor 09/20/2016  . Labor and delivery, indication for care 09/08/2016  . Decreased fetal movement 07/24/2016  . Pregnancy 09/15/2015  . Insufficient prenatal care 09/15/2015  . Indication for care in labor or delivery 09/10/2015  . Braxton Hicks contractions  09/08/2015  . Candida albicans infection 08/16/2015  . Abnormal antenatal AFP screen 05/24/2015  . Irregular contractions 05/24/2015    Past Surgical History:  Procedure Laterality Date  . DIAGNOSTIC LAPAROSCOPY     endometriosis  . TUBAL LIGATION N/A 09/17/2015   Procedure: POST PARTUM TUBAL LIGATION;  Surgeon: Vena Austria, MD;  Location: [facility] ORS;  Service: Gynecology;  Laterality: N/A;  . TUBAL LIGATION Bilateral 09/21/2016   Procedure: POST PARTUM TUBAL LIGATION;  Surgeon: Nadara Mustard, MD;  Location: [facility] ORS;  Service: Gynecology;  Laterality: Bilateral;     OB History    Gravida  4   Para  4   Term  4   Preterm  0   AB  0   Living  4     SAB  0   IAB  0   Ectopic  0   Multiple  0   Live Births  4        Obstetric Comments  Pt states last infant contracted GBS even though pt treated in labor. Pt states infant caught it anyway- treated successfully. IF positive for GBS at 36 weeks this pregnancy - pt says plan is for C/S. 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 Hypertension Mother   . Diabetes Mother   . Heart disease Mother 55       failure  . Heart failure Mother   . Depression Father   .  Heart disease Maternal Grandfather   . Diabetes Paternal Grandmother   . Hypertension Paternal Grandmother   . Cancer Paternal Grandfather        lung  . Hearing loss Neg Hx     Social History   Tobacco Use  . Smoking status: Heavy Tobacco Smoker    Packs/day: 0.50    Years: 10.00    Pack years: 5.00    Types: Cigarettes  . Smokeless tobacco: Never Used  Vaping Use  . Vaping Use: Some days  Substance Use Topics  . Alcohol use: Not Currently  . Drug use: Yes    Types: Marijuana, Methamphetamines, Heroin, IV    Comment: heroin-last use June 2021    Home Medications Prior to Admission medications   Medication Sig Start Date End Date Taking? Authorizing Provider   amoxicillin-clavulanate (AUGMENTIN) 875-125 MG tablet Take 1 tablet by mouth every 12 (twelve) hours for 10 days. 02/15/21 02/25/21 Yes Sabino Donovan, MD  albuterol (VENTOLIN HFA) 108 (90 Base) MCG/ACT inhaler Inhale 1 puff into the lungs every 6 (six) hours as needed for wheezing or shortness of breath. 02/07/21   Milagros Loll, MD  ARIPiprazole (ABILIFY) 15 MG tablet Take 15 mg by mouth at bedtime. 11/28/19   [provider]  FLUoxetine (PROZAC) 40 MG capsule Take 2 capsules (80 mg total) by mouth daily. 06/19/19   Clapacs, Jackquline Denmark, MD  gabapentin (NEURONTIN) 100 MG capsule Take 100 mg by mouth 3 (three) times daily. 11/28/19   [provider]  ketorolac (ACULAR) 0.5 % ophthalmic solution Place 1 drop into both eyes 4 (four) times daily. 12/01/19   Menshew, Charlesetta Ivory, PA-C  naloxone [facility]) nasal spray 4 mg/0.1 mL One spray for overdose 06/12/19   Alford Highland, MD  ondansetron (ZOFRAN ODT) 8 MG disintegrating tablet Take 1 tablet (8 mg total) by mouth every 8 (eight) hours as needed for nausea or vomiting. 12/27/20   Molpus, John, MD  traZODone (DESYREL) 50 MG tablet Take 50 mg by mouth at bedtime. 11/28/19   [provider]    Allergies    Patient has no known allergies.  Review of Systems   Review of Systems  Constitutional: Negative for chills and fever.  HENT: Positive for congestion, rhinorrhea and sinus pain.   Respiratory: Negative for cough and shortness of breath.   Cardiovascular: Negative for chest pain and palpitations.  Gastrointestinal: Negative for diarrhea, nausea and vomiting.  Genitourinary: Negative for difficulty urinating and dysuria.  Musculoskeletal: Negative for arthralgias and back pain.  Skin: Negative for rash and wound.  Neurological: Negative for light-headedness and headaches.    Physical Exam Updated Vital Signs BP 126/76 (BP Location: Left Arm)   Pulse 84   Temp 98.3 F (36.8 C) (Oral)   Resp 18   Ht 5\' 5"  (1.651 m)    Wt 64.4 kg   SpO2 100%   BMI 23.63 kg/m   Physical Exam Vitals and nursing note reviewed. Exam conducted with a chaperone present.  Constitutional:      General: She is not in acute distress.    Appearance: Normal appearance.  HENT:     Head: Normocephalic and atraumatic.     Comments: No facial swelling mild tenderness to palpation frontal and maxillary sinuses    Right Ear: Tympanic membrane normal.     Left Ear: Tympanic membrane normal.     Nose: Congestion present. No rhinorrhea.     Mouth/Throat:     Mouth: Mucous  membranes are moist.  Eyes:     General:        Right eye: No discharge.        Left eye: No discharge.     Conjunctiva/sclera: Conjunctivae normal.  Cardiovascular:     Rate and Rhythm: Normal rate and regular rhythm.  Pulmonary:     Effort: Pulmonary effort is normal. No respiratory distress.     Breath sounds: No stridor.  Abdominal:     General: Abdomen is flat. There is no distension.     Palpations: Abdomen is soft.  Musculoskeletal:        General: No tenderness or signs of injury.  Skin:    General: Skin is warm and dry.  Neurological:     General: No focal deficit present.     Mental Status: She is alert. Mental status is at baseline.     Motor: No weakness.     Comments: Cranial nerves II through XII intact  Psychiatric:        Mood and Affect: Mood normal.        Behavior: Behavior normal.     ED Results / Procedures / Treatments   Labs (all labs ordered are listed, but only abnormal results are displayed) Labs Reviewed - No data to display  EKG None  Radiology No results found.  Procedures Procedures   Medications Ordered in ED Medications - No data to display  ED Course  I have reviewed the triage vital signs and the nursing notes.  Pertinent labs & imaging results that were available during my care of the patient were reviewed by me and considered in my medical decision making (see chart for details).    MDM  Rules/Calculators/A&P                          8 1/2 days of sinusitis type symptoms.  Overall well-appearing normal vital signs.  Due to duration will consider treatment with antibiotics.  Supportive care.  No signs of deep space infection.  Patient overall well-appearing and agrees to plan.  Offered Covid testing and declined.  Patient understands quarantine needs based on possible positive Covid test.  Safe for discharge home strict return precautions discussed Final Clinical Impression(s) / ED Diagnoses Final diagnoses:  Acute sinusitis, recurrence not specified, unspecified location    Rx / DC Orders ED Discharge Orders         Ordered    amoxicillin-clavulanate (AUGMENTIN) 875-125 MG tablet  Every 12 hours        02/15/21 2344           Sabino Donovan, MD 02/15/21 [phone]

## 2021-02-15 NOTE — Discharge Instructions (Signed)
You can take 600 mg of ibuprofen every 6 hours, you can take 1000 mg of Tylenol every 6 hours, you can alternate these every 3 or you can take them together.  Be careful supplementing your symptom control with DayQuil NyQuil as these often have Tylenol in them.  Limit total Tylenol dosing to 4000 mg/day.

## 2021-02-15 NOTE — ED Triage Notes (Signed)
Pt reports 3 days of n/v/d which has resolved. She now states she is having cough and sinus pressure

## 2021-02-15 NOTE — ED Notes (Signed)
ED Provider at bedside. 

## 2021-05-03 ENCOUNTER — Emergency Department (HOSPITAL_BASED_OUTPATIENT_CLINIC_OR_DEPARTMENT_OTHER): Payer: Medicaid Other

## 2021-05-03 ENCOUNTER — Telehealth (HOSPITAL_BASED_OUTPATIENT_CLINIC_OR_DEPARTMENT_OTHER): Payer: Self-pay | Admitting: *Deleted

## 2021-05-03 ENCOUNTER — Emergency Department (HOSPITAL_BASED_OUTPATIENT_CLINIC_OR_DEPARTMENT_OTHER)
Admission: EM | Admit: 2021-05-03 | Discharge: 2021-05-03 | Discharge status: Against Medical Advice | Payer: Medicaid Other | Attending: Emergency Medicine | Admitting: Emergency Medicine

## 2021-05-03 ENCOUNTER — Other Ambulatory Visit: Payer: Self-pay

## 2021-05-03 ENCOUNTER — Encounter (HOSPITAL_BASED_OUTPATIENT_CLINIC_OR_DEPARTMENT_OTHER): Payer: Self-pay | Admitting: Emergency Medicine

## 2021-05-03 DIAGNOSIS — R000 Tachycardia, unspecified: Secondary | ICD-10-CM | POA: Insufficient documentation

## 2021-05-03 DIAGNOSIS — R058 Other specified cough: Secondary | ICD-10-CM

## 2021-05-03 DIAGNOSIS — Z28310 Unvaccinated for covid-19: Secondary | ICD-10-CM | POA: Diagnosis not present

## 2021-05-03 DIAGNOSIS — U071 COVID-19: Secondary | ICD-10-CM | POA: Insufficient documentation

## 2021-05-03 DIAGNOSIS — J45901 Unspecified asthma with (acute) exacerbation: Secondary | ICD-10-CM | POA: Insufficient documentation

## 2021-05-03 DIAGNOSIS — F17210 Nicotine dependence, cigarettes, uncomplicated: Secondary | ICD-10-CM | POA: Insufficient documentation

## 2021-05-03 DIAGNOSIS — R0602 Shortness of breath: Secondary | ICD-10-CM | POA: Diagnosis present

## 2021-05-03 LAB — RESP PANEL BY RT-PCR (FLU A&B, COVID) ARPGX2
Influenza A by PCR: NEGATIVE
Influenza B by PCR: NEGATIVE
SARS Coronavirus 2 by RT PCR: POSITIVE — AB

## 2021-05-03 MED ORDER — PREDNISONE 20 MG PO TABS: 60.0000 mg | ORAL_TABLET | Freq: Every day | ORAL | 0 refills | Status: AC

## 2021-05-03 MED ORDER — ACETAMINOPHEN 500 MG PO TABS
1000.0000 mg | ORAL_TABLET | Freq: Once | ORAL | Status: AC
  Administered 2021-05-03: 1000 mg via ORAL
  Filled 2021-05-03: qty 2

## 2021-05-03 MED ORDER — METHYLPREDNISOLONE SODIUM SUCC 125 MG IJ SOLR
125.0000 mg | Freq: Once | INTRAMUSCULAR | Status: DC
  Filled 2021-05-03: qty 2

## 2021-05-03 MED ORDER — PREDNISONE 50 MG PO TABS
60.0000 mg | ORAL_TABLET | Freq: Once | ORAL | Status: AC
  Administered 2021-05-03: 60 mg via ORAL
  Filled 2021-05-03: qty 1

## 2021-05-03 MED ORDER — SODIUM CHLORIDE 0.9 % IV BOLUS: 1000.0000 mL | Freq: Once | INTRAVENOUS | Status: DC

## 2021-05-03 MED ORDER — ALBUTEROL SULFATE HFA 108 (90 BASE) MCG/ACT IN AERS
6.0000 | INHALATION_SPRAY | Freq: Once | RESPIRATORY_TRACT | Status: AC
  Administered 2021-05-03: 6 via RESPIRATORY_TRACT
  Filled 2021-05-03: qty 6.7

## 2021-05-03 NOTE — Telephone Encounter (Signed)
COVID + lab result rec'd from lab. Notified [place], [place] of the same. Attempted to contact pt via phone listed in chart, phone rang and then gave busy signal. Attempted x2, unable to reach pt.

## 2021-05-03 NOTE — ED Notes (Signed)
ED Provider at bedside. 

## 2021-05-03 NOTE — ED Provider Notes (Signed)
[facility] EMERGENCY DEPARTMENT Provider Note   CSN: [id number] Arrival date & time: 05/03/21  2131     History Chief Complaint  Patient presents with  . URI    Betty Gomez is a 35 y.o. female.  Betty Gomez is a 35 y.o. female with a history of asthma, anxiety, depression, polysubstance abuse, who presents to the emergency department for evaluation of cough, chest pain and shortness of breath.  Patient reports she has been experiencing the symptoms for the past 2 weeks.  Patient states "I feel like I am dying, I cannot breathe".  She reports she has been coughing up thick green mucus.  She has not been seen or evaluated for this.  Did not have any of her albuterol inhaler left but feels like she has been wheezing and felt a tightness in her chest that radiates into her back.  Feels like some of this may be due to her asthma.  She also reports a generalized chest pain that is worse with any coughing, not pleuritic.  She reports some abdominal pain from coughing, no vomiting or diarrhea.  She has had chills, but has not taken her temperature.  She is not vaccinated for COVID, denies known sick contacts, has not been tested during the symptoms.  Does report history of substance abuse but has not used alcohol or drugs in the past 2 weeks due to her illness.  Does not feel like she is withdrawing.  The history is provided by the patient.       Past Medical History:  Diagnosis Date  . Anxiety   . Asthma   . Depression    ok, came off meds, has someone she sees for this.  . Endometriosis   . Infection    UTI  . Substance abuse [facility])     Patient Active Problem List   Diagnosis Date Noted  . MDD (major depressive disorder), severe (HCC) 06/14/2019  . Drug overdose, intentional, initial encounter (HCC) 06/10/2019  . Severe recurrent major depression without psychotic features (HCC) 11/17/2018  . Bipolar disorder, unspecified (HCC) 11/17/2018  . Substance  induced mood disorder (HCC) 11/17/2018  . Cannabis abuse 11/17/2018  . Methamphetamine abuse (HCC) 11/17/2018  . Opioid abuse (HCC) 11/17/2018  . Tobacco abuse 11/17/2018  . Polysubstance abuse (HCC) 11/17/2018  . Suicidal ideation 11/16/2018  . Depression with suicidal ideation 11/16/2018  . Alcohol abuse 11/16/2018  . 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 Benzodiazepine abuse (HCC) 09/21/2017  . Normal labor 09/20/2016  . Labor and delivery, indication for care 09/08/2016  . Decreased fetal movement 07/24/2016  . Pregnancy 09/15/2015  . Insufficient prenatal care 09/15/2015  . Indication for care in labor or delivery 09/10/2015  . Braxton Hicks contractions 09/08/2015  . Candida albicans infection 08/16/2015  . Abnormal antenatal AFP screen 05/24/2015  . Irregular contractions 05/24/2015    Past Surgical History:  Procedure Laterality Date  . DIAGNOSTIC LAPAROSCOPY     endometriosis  . TUBAL LIGATION N/A 09/17/2015   Procedure: POST PARTUM TUBAL LIGATION;  Surgeon: Vena Austria, MD;  Location: [facility] ORS;  Service: Gynecology;  Laterality: N/A;  . TUBAL LIGATION Bilateral 09/21/2016   Procedure: POST PARTUM TUBAL LIGATION;  Surgeon: Nadara Mustard, MD;  Location: [facility] ORS;  Service: Gynecology;  Laterality: Bilateral;     OB History    Gravida  4   Para  4   Term  4   Preterm  0   AB  0   Living  4     SAB  0   IAB  0   Ectopic  0   Multiple  0   Live Births  4        Obstetric Comments  Pt states last infant contracted GBS even though pt treated in labor. Pt states infant caught it anyway- treated successfully. IF positive for GBS at 36 weeks this pregnancy - pt says plan is for C/S. 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 Hypertension Mother   . Diabetes Mother   . Heart disease Mother 62       failure  . Heart failure Mother   .  Depression Father   . Heart disease Maternal Grandfather   . Diabetes Paternal Grandmother   . Hypertension Paternal Grandmother   . Cancer Paternal Grandfather        lung  . Hearing loss Neg Hx     Social History   Tobacco Use  . Smoking status: Heavy Tobacco Smoker    Packs/day: 0.50    Years: 10.00    Pack years: 5.00    Types: Cigarettes  . Smokeless tobacco: Never Used  Vaping Use  . Vaping Use: Some days  Substance Use Topics  . Alcohol use: Not Currently  . Drug use: Yes    Types: Marijuana, Methamphetamines, Heroin, IV    Comment: heroin-last use June 2021    Home Medications Prior to Admission medications   Medication Sig Start Date End Date Taking? Authorizing Provider  albuterol (VENTOLIN HFA) 108 (90 Base) MCG/ACT inhaler Inhale 1 puff into the lungs every 6 (six) hours as needed for wheezing or shortness of breath. 02/07/21   Milagros Loll, MD  ARIPiprazole (ABILIFY) 15 MG tablet Take 15 mg by mouth at bedtime. 11/28/19   [provider]  FLUoxetine (PROZAC) 40 MG capsule Take 2 capsules (80 mg total) by mouth daily. 06/19/19   Clapacs, Jackquline Denmark, MD  gabapentin (NEURONTIN) 100 MG capsule Take 100 mg by mouth 3 (three) times daily. 11/28/19   [provider]  ketorolac (ACULAR) 0.5 % ophthalmic solution Place 1 drop into both eyes 4 (four) times daily. 12/01/19   Menshew, Charlesetta Ivory, PA-C  naloxone [facility]) nasal spray 4 mg/0.1 mL One spray for overdose 06/12/19   Alford Highland, MD  ondansetron (ZOFRAN ODT) 8 MG disintegrating tablet Take 1 tablet (8 mg total) by mouth every 8 (eight) hours as needed for nausea or vomiting. 12/27/20   Molpus, John, MD  traZODone (DESYREL) 50 MG tablet Take 50 mg by mouth at bedtime. 11/28/19   [provider]    Allergies    Patient has no known allergies.  Review of Systems   Review of Systems  Constitutional: Positive for chills. Negative for fever.  HENT: Positive for rhinorrhea and sore  throat. Negative for congestion.   Respiratory: Positive for cough and shortness of breath.   Cardiovascular: Positive for chest pain.  Gastrointestinal: Positive for abdominal pain. Negative for diarrhea, nausea and vomiting.  Genitourinary: Negative for dysuria.  Musculoskeletal: Positive for myalgias. Negative for arthralgias.  Skin: Negative for color change and rash.  Neurological: Negative for headaches.  All other systems reviewed and are negative.   Physical Exam Updated Vital Signs BP 130/81 (BP Location: Left Arm)   Pulse (!) 124  Temp 97.8 F (36.6 C) (Oral)   Resp 20   Ht 5\' 6"  (1.676 m)   Wt 62.1 kg   LMP 05/01/2021   SpO2 100%   BMI 22.11 kg/m   Physical Exam Vitals and nursing note reviewed.  Constitutional:      General: She is not in acute distress.    Appearance: Normal appearance. She is well-developed. She is ill-appearing. She is not diaphoretic.  HENT:     Head: Normocephalic and atraumatic.     Nose: Congestion and rhinorrhea present.     Mouth/Throat:     Comments: Posterior oropharynx clear and mucous membranes moist, there is mild erythema but no edema or tonsillar exudates, uvula midline, normal phonation, no trismus, tolerating secretions without difficulty.  Eyes:     General:        Right eye: No discharge.        Left eye: No discharge.     Pupils: Pupils are equal, round, and reactive to light.  Cardiovascular:     Rate and Rhythm: Regular rhythm. Tachycardia present.     Heart sounds: Normal heart sounds.     Comments: Sinus tachycardia with heart rates in the 110-120s Pulmonary:     Effort: No respiratory distress.     Breath sounds: Wheezing present. No rales.     Comments: Patient tachypneic, no accessory muscle use, satting 100% on room air and able to speak in full sentences.  On auscultation she has bilateral expiratory wheezing with some diminished breath sounds Abdominal:     General: Bowel sounds are normal. There is no  distension.     Palpations: Abdomen is soft. There is no mass.     Tenderness: There is no abdominal tenderness. There is no guarding.     Comments: Abdomen soft, nondistended, nontender to palpation in all quadrants without guarding or peritoneal signs   Musculoskeletal:        General: No deformity.     Cervical back: Neck supple.  Skin:    General: Skin is warm and dry.     Capillary Refill: Capillary refill takes less than 2 seconds.  Neurological:     Mental Status: She is alert.     Coordination: Coordination normal.     Comments: Speech is clear, able to follow commands Moves extremities without ataxia, coordination intact  Psychiatric:        Mood and Affect: Mood normal.        Behavior: Behavior normal.     ED Results / Procedures / Treatments   Labs (all labs ordered are listed, but only abnormal results are displayed) Labs Reviewed  RESP PANEL BY RT-PCR (FLU A&B, COVID) [id number]  COMPREHENSIVE METABOLIC PANEL  CBC WITH DIFFERENTIAL/PLATELET    EKG None  Radiology DG Chest Port 1 View  Result Date: 05/03/2021 CLINICAL DATA:  Cough and short of breath EXAM: PORTABLE CHEST 1 VIEW COMPARISON:  06/10/2019 FINDINGS: The heart size and mediastinal contours are within normal limits. Both lungs are clear. The visualized skeletal structures are unremarkable. IMPRESSION: No active disease. Electronically Signed   By: Jasmine PangKim  Fujinaga M.D.   On: 05/03/2021 22:38    Procedures Procedures   Medications Ordered in ED Medications  sodium chloride 0.9 % bolus 1,000 mL (0 mLs Intravenous Hold 05/03/21 2227)  predniSONE (DELTASONE) tablet 60 mg (has no administration in time range)  acetaminophen (TYLENOL) tablet 1,000 mg (has no administration in time range)  albuterol (VENTOLIN HFA) 108 (90 Base) MCG/ACT inhaler  6 puff (6 puffs Inhalation Given 05/03/21 2204)    ED Course  I have reviewed the triage vital signs and the nursing notes.  Pertinent labs & imaging results that  were available during my care of the patient were reviewed by me and considered in my medical decision making (see chart for details).    MDM Rules/Calculators/A&P                         Patient presents to the emergency department with cough, shortness of breath and chest pain.  Symptoms present for 2 weeks.  Patient is tachycardic and tachypneic, somewhat ill-appearing, wheezing noted on exam without accessory muscle use.  Able to speak in full sentences.   DDX: Pneumonia, asthma exacerbation, COVID, endocarditis, ACS, pulmonary embolism, dissection, pneumothorax, arrhythmia, severe anemia, MSK, GERD, anxiety, abdominal process .    EKG: Sinus tachycardia with HR 121  Lab Tests:  I Ordered, reviewed, and interpreted labs, which included: CBC, BMP, troponin, pregnancy test, COVID test  Imaging Studies ordered:  I ordered imaging studies which included CXR, I independently reviewed, formal radiology impression shows:  No active cardiopulmonary disease  ED Course:   Notified by nursing staff that after 1 unsuccessful attempt at obtaining labs and IV patient refused any additional attempts to collect blood work or place IV for further treatment and evaluation of patient's symptoms.  I went in and discussed with patient that given her chest pain and shortness of breath without being able to further evaluate this with labs, and provide her with IV fluids to help address tachycardia I do not feel that I am able to fully evaluate her complaints to rule out any potential life, limb or organ threatening medical emergency.  Despite this conversation patient still refuses lab work or IV.  I have reviewed her chest x-ray and share these results with her.  We will treat for asthma exacerbation.  Patient does have COVID test pending but at this time she would like to leave without further evaluation of her complaints.  She is alert and has decision-making capacity and understands the risks and has signed  out AGAINST MEDICAL ADVICE.  She was provided prescriptions for prednisone and given an albuterol inhaler and encouraged to return to the ED for more complete evaluation of her symptoms at any time.  Portions of this note were generated with Scientist, clinical (histocompatibility and immunogenetics). Dictation errors may occur despite best attempts at proofreading.   Final Clinical Impression(s) / ED Diagnoses Final diagnoses:  Exacerbation of asthma, unspecified asthma severity, unspecified whether persistent    Rx / DC Orders ED Discharge Orders    None       Legrand Rams 05/03/21 2327    Virgina Norfolk, DO 05/04/21 2152

## 2021-05-03 NOTE — Discharge Instructions (Addendum)
Please use the prednisone daily for the next 5 days and use the albuterol inhaler that was given to you every 4-6 hours for the next 24 hours and then as needed.  If you would like to be evaluated for the chest pain and shortness of breath you are experiencing today further you can return to the emergency department at any time for continued evaluation.  Without further evaluation I cannot rule out a potential life, limb or organ threatening illness that could lead to death or disability.

## 2021-05-03 NOTE — ED Triage Notes (Signed)
Reports cough, sore throat, sob, body aches, and chills for two weeks.

## 2021-07-22 ENCOUNTER — Other Ambulatory Visit: Payer: Self-pay

## 2021-07-22 ENCOUNTER — Encounter (HOSPITAL_BASED_OUTPATIENT_CLINIC_OR_DEPARTMENT_OTHER): Payer: Self-pay | Admitting: Emergency Medicine

## 2021-07-22 DIAGNOSIS — J45909 Unspecified asthma, uncomplicated: Secondary | ICD-10-CM | POA: Insufficient documentation

## 2021-07-22 DIAGNOSIS — R202 Paresthesia of skin: Secondary | ICD-10-CM | POA: Insufficient documentation

## 2021-07-22 DIAGNOSIS — F17210 Nicotine dependence, cigarettes, uncomplicated: Secondary | ICD-10-CM | POA: Insufficient documentation

## 2021-07-22 NOTE — ED Triage Notes (Addendum)
Pt states left arm numbness. Woke up on floor about 3-4 pm unable to move arm roommate called ems and went to [facility] had imaging and lab work. Was told afterwards she was given heroin unbenownst to her.

## 2021-07-23 ENCOUNTER — Emergency Department (HOSPITAL_COMMUNITY): Payer: Medicaid Other

## 2021-07-23 ENCOUNTER — Emergency Department (HOSPITAL_BASED_OUTPATIENT_CLINIC_OR_DEPARTMENT_OTHER)
Admission: EM | Admit: 2021-07-23 | Discharge: 2021-07-23 | Disposition: A | Discharge status: Home / Self Care | Payer: Medicaid Other | Attending: Emergency Medicine | Admitting: Emergency Medicine

## 2021-07-23 DIAGNOSIS — R200 Anesthesia of skin: Secondary | ICD-10-CM

## 2021-07-23 NOTE — ED Provider Notes (Signed)
Emergency Department Provider Note   I have reviewed the triage vital signs and the nursing notes.   HISTORY  Chief Complaint Numbness   HPI Betty Gomez is a 35 y.o. female with past medical history reviewed below presents to the emergency department with left arm numbness.  Patient was last normal this afternoon.  She states that she abuses drugs and snorted what she thought was cocaine but when she awoke found out that it was likely heroin/fentanyl.  She has a history of IV drug abuse but denies any recent use.  When she awoke she was on the ground and having numbness in the left arm only.  She has no face or leg symptoms.  No weakness.  No vision change or speech disturbance.  She went to [facility] emergency department and had lab work along with CT imaging of the head which is available in care everywhere. She left the ED prior to completing her evaluation and came here for further testing. No change in symptoms. No prior history of CVA.   Past Medical History:  Diagnosis Date   Anxiety    Asthma    Depression    ok, came off meds, has someone she sees for this.   Endometriosis    Infection    UTI   Substance abuse [facility])     Patient Active Problem List   Diagnosis Date Noted   MDD (major depressive disorder), severe (HCC) 06/14/2019   Drug overdose, intentional, initial encounter (HCC) 06/10/2019   Severe recurrent major depression without psychotic features (HCC) 11/17/2018   Bipolar disorder, unspecified (HCC) 11/17/2018   Substance induced mood disorder (HCC) 11/17/2018   Cannabis abuse 11/17/2018   Methamphetamine abuse (HCC) 11/17/2018   Opioid abuse (HCC) 11/17/2018   Tobacco abuse 11/17/2018   Polysubstance abuse (HCC) 11/17/2018   Suicidal ideation 11/16/2018   Depression with suicidal ideation 11/16/2018   Alcohol abuse 11/16/2018   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   Benzodiazepine abuse (HCC)  09/21/2017   Normal labor 09/20/2016   Labor and delivery, indication for care 09/08/2016   Decreased fetal movement 07/24/2016   Pregnancy 09/15/2015   Insufficient prenatal care 09/15/2015   Indication for care in labor or delivery 09/10/2015   Braxton Hicks contractions 09/08/2015   Candida albicans infection 08/16/2015   Abnormal antenatal AFP screen 05/24/2015   Irregular contractions 05/24/2015    Past Surgical History:  Procedure Laterality Date   DIAGNOSTIC LAPAROSCOPY     endometriosis   TUBAL LIGATION N/A 09/17/2015   Procedure: POST PARTUM TUBAL LIGATION;  Surgeon: Vena Austria, MD;  Location: [facility] ORS;  Service: Gynecology;  Laterality: N/A;   TUBAL LIGATION Bilateral 09/21/2016   Procedure: POST PARTUM TUBAL LIGATION;  Surgeon: Nadara Mustard, MD;  Location: [facility] ORS;  Service: Gynecology;  Laterality: Bilateral;    Allergies Patient has no known allergies.  Family History  Problem Relation Age of Onset   Hypertension Mother    Diabetes Mother    Heart disease Mother 61       failure   Heart failure Mother    Depression Father    Heart disease Maternal Grandfather    Diabetes Paternal Grandmother    Hypertension Paternal Grandmother    Cancer Paternal Grandfather        lung   Hearing loss Neg Hx     Social History Social History   Tobacco Use   Smoking status: Heavy Smoker  Packs/day: 0.50    Years: 10.00    Pack years: 5.00    Types: Cigarettes   Smokeless tobacco: Never  Vaping Use   Vaping Use: Some days  Substance Use Topics   Alcohol use: Not Currently   Drug use: Not Currently    Types: Marijuana, Methamphetamines, Heroin, IV    Comment: heroin-last use June 2021    Review of Systems  Constitutional: No fever/chills Eyes: No visual changes. ENT: No sore throat. Cardiovascular: Denies chest pain. Respiratory: Denies shortness of breath. Gastrointestinal: No abdominal pain.  No nausea, no vomiting.  No diarrhea.  No  constipation. Genitourinary: Negative for dysuria. Musculoskeletal: Negative for back pain. Denies neck pain.  Skin: Negative for rash. Neurological: Negative for headaches, focal weakness. Positive left arm numbness.   10-point ROS otherwise negative.  ____________________________________________   PHYSICAL EXAM:  VITAL SIGNS: Vitals:   07/23/21 0030 07/23/21 0100  BP: 111/68 103/60  Pulse: (!) 57 62  Resp: 13 12  SpO2: 95% 100%      Constitutional: Alert and oriented. Well appearing and in no acute distress. Eyes: Conjunctivae are normal.  Head: Atraumatic. Nose: No congestion/rhinnorhea. Mouth/Throat: Mucous membranes are moist.  Neck: No stridor.   Cardiovascular: Normal rate, regular rhythm. Good peripheral circulation. Grossly normal heart sounds.   Respiratory: Normal respiratory effort.  No retractions. Lungs CTAB. Gastrointestinal: Soft and nontender. No distention.  Musculoskeletal: No lower extremity tenderness nor edema. No gross deformities of extremities. Neurologic:  Normal speech and language. Numbness to the LUE noted with equal grip strength. 5/5 strength in biceps/triceps. Normal face sensation and LLE sensation and strength.  Skin:  Skin is warm, dry and intact. No rash noted.   ____________________________________________   LABS (all labs ordered are listed, but only abnormal results are displayed)  Labs reviewed from [facility].   ____________________________________________  RADIOLOGY  CT head reviewed from [facility]  ____________________________________________   PROCEDURES  Procedure(s) performed:   Procedures  None  ____________________________________________   INITIAL IMPRESSION / ASSESSMENT AND PLAN / ED COURSE  Pertinent labs & imaging results that were available during my care of the patient were reviewed by me and considered in my medical decision making (see chart for details).   Patient presents  to the emergency department with left arm numbness.  She was at an outside emergency department earlier this evening and had lab work along with CT imaging of the head with no acute findings.  With persistent symptoms, feel that while less likely, stroke should be considered and evaluated further.  Plan for MRI of the brain along with cervical spine. This in not available at this facility and will require transfer. Patient is outside of the tPA window. No LVO exam findings. Will discuss with [facility]. Patient has someone here who can drive her to facilitate transfer.   Spoke with Dr. Manus Gunning who accepts patient in ED-ED transfer for MRI. Placed order for brain and c-spine with left arm numbness.    ____________________________________________  FINAL CLINICAL IMPRESSION(S) / ED DIAGNOSES  Final diagnoses:  Left arm numbness     Note:  This document was prepared using Dragon voice recognition software and may include unintentional dictation errors.  Alona Bene, MD, [facility] Emergency Medicine    Cleofas Hudgins, Arlyss Repress, MD 07/23/21 [phone]

## 2021-07-23 NOTE — Discharge Instructions (Addendum)
Return for any problem.  Avoid use of illicit substances.

## 2021-07-23 NOTE — ED Provider Notes (Signed)
[facility] EMERGENCY DEPARTMENT Provider Note   CSN: [id number] Arrival date & time: 07/22/21  2327     History Chief Complaint  Patient presents with   Numbness    Betty Gomez is a 35 y.o. female.  35 year old female with prior medical history as detailed below presents for evaluation.  Patient was transferred into [facility] for MRI imaging of head and C-spine.  Patient's history obtained from both the patient this morning and also from chart review.  Patient reports recent use of illicit substance.  She became obtunded after this use.  When she came to her left arm was folded underneath her.  She complains of tingling and numbness in the left arm from the elbow distal.  She describes tingling and discomfort to the left arm and a glove like distribution extending from the elbow distally.  She denies significant weakness to the left arm.  Patient's symptoms are mildly improved over the course of the last 24 hours.  Of note, patient initially presented at outside facility.  Labs and CT are without significant abnormality.  CK was 144.  CT did not demonstrate evidence of significant intracranial process.  Patient then presented at [facility] facility.  She was transferred to [facility] from there for MRI imaging.  The history is provided by the patient.  Illness Location:  Left arm tingling and numbness Severity:  Mild Onset quality:  Gradual Duration:  1 day Timing:  Unable to specify Progression:  Improving Chronicity:  New Associated symptoms: no fever       Past Medical History:  Diagnosis Date   Anxiety    Asthma    Depression    ok, came off meds, has someone she sees for this.   Endometriosis    Infection    UTI   Substance abuse [facility])     Patient Active Problem List   Diagnosis Date Noted   MDD (major depressive disorder), severe (HCC) 06/14/2019   Drug overdose, intentional, initial encounter (HCC) 06/10/2019   Severe recurrent major  depression without psychotic features (HCC) 11/17/2018   Bipolar disorder, unspecified (HCC) 11/17/2018   Substance induced mood disorder (HCC) 11/17/2018   Cannabis abuse 11/17/2018   Methamphetamine abuse (HCC) 11/17/2018   Opioid abuse (HCC) 11/17/2018   Tobacco abuse 11/17/2018   Polysubstance abuse (HCC) 11/17/2018   Suicidal ideation 11/16/2018   Depression with suicidal ideation 11/16/2018   Alcohol abuse 11/16/2018   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   Benzodiazepine abuse (HCC) 09/21/2017   Normal labor 09/20/2016   Labor and delivery, indication for care 09/08/2016   Decreased fetal movement 07/24/2016   Pregnancy 09/15/2015   Insufficient prenatal care 09/15/2015   Indication for care in labor or delivery 09/10/2015   Braxton Hicks contractions 09/08/2015   Candida albicans infection 08/16/2015   Abnormal antenatal AFP screen 05/24/2015   Irregular contractions 05/24/2015    Past Surgical History:  Procedure Laterality Date   DIAGNOSTIC LAPAROSCOPY     endometriosis   TUBAL LIGATION N/A 09/17/2015   Procedure: POST PARTUM TUBAL LIGATION;  Surgeon: Vena Austria, MD;  Location: [facility] ORS;  Service: Gynecology;  Laterality: N/A;   TUBAL LIGATION Bilateral 09/21/2016   Procedure: POST PARTUM TUBAL LIGATION;  Surgeon: Nadara Mustard, MD;  Location: [facility] ORS;  Service: Gynecology;  Laterality: Bilateral;     OB History     Gravida  4   Para  4   Term  4  Preterm  0   AB  0   Living  4      SAB  0   IAB  0   Ectopic  0   Multiple  0   Live Births  4        Obstetric Comments  Pt states last infant contracted GBS even though pt treated in labor. Pt states infant caught it anyway- treated successfully. IF positive for GBS at 36 weeks this pregnancy - pt says plan is for C/S. 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, grtowing well         Family History  Problem Relation Age of Onset   Hypertension  Mother    Diabetes Mother    Heart disease Mother 43       failure   Heart failure Mother    Depression Father    Heart disease Maternal Grandfather    Diabetes Paternal Grandmother    Hypertension Paternal Grandmother    Cancer Paternal Grandfather        lung   Hearing loss Neg Hx     Social History   Tobacco Use   Smoking status: Heavy Smoker    Packs/day: 0.50    Years: 10.00    Pack years: 5.00    Types: Cigarettes   Smokeless tobacco: Never  Vaping Use   Vaping Use: Some days  Substance Use Topics   Alcohol use: Not Currently   Drug use: Not Currently    Types: Marijuana, Methamphetamines, Heroin, IV    Comment: heroin-last use June 2021    Home Medications Prior to Admission medications   Medication Sig Start Date End Date Taking? Authorizing Provider  albuterol (VENTOLIN HFA) 108 (90 Base) MCG/ACT inhaler Inhale 1 puff into the lungs every 6 (six) hours as needed for wheezing or shortness of breath. 02/07/21   Milagros Loll, MD  ARIPiprazole (ABILIFY) 15 MG tablet Take 15 mg by mouth at bedtime. 11/28/19   [provider]  FLUoxetine (PROZAC) 40 MG capsule Take 2 capsules (80 mg total) by mouth daily. 06/19/19   Clapacs, Jackquline Denmark, MD  gabapentin (NEURONTIN) 100 MG capsule Take 100 mg by mouth 3 (three) times daily. 11/28/19   [provider]  ketorolac (ACULAR) 0.5 % ophthalmic solution Place 1 drop into both eyes 4 (four) times daily. 12/01/19   Menshew, Charlesetta Ivory, PA-C  naloxone [facility]) nasal spray 4 mg/0.1 mL One spray for overdose 06/12/19   Alford Highland, MD  ondansetron (ZOFRAN ODT) 8 MG disintegrating tablet Take 1 tablet (8 mg total) by mouth every 8 (eight) hours as needed for nausea or vomiting. 12/27/20   Molpus, John, MD  traZODone (DESYREL) 50 MG tablet Take 50 mg by mouth at bedtime. 11/28/19   [provider]    Allergies    Patient has no known allergies.  Review of Systems   Review of Systems  Constitutional:   Negative for fever.  All other systems reviewed and are negative.  Physical Exam Updated Vital Signs BP (!) 106/53 (BP Location: Right Arm)   Pulse 73   Temp 98.3 F (36.8 C) (Oral)   Resp 17   Ht 5\' 6"  (1.676 m)   Wt 68 kg   LMP 06/16/2021 (Approximate)   SpO2 98%   BMI 24.21 kg/m   Physical Exam Vitals and nursing note reviewed.  Constitutional:      General: She is not in acute distress.    Appearance: Normal appearance.  She is well-developed.  HENT:     Head: Normocephalic and atraumatic.  Eyes:     Conjunctiva/sclera: Conjunctivae normal.     Pupils: Pupils are equal, round, and reactive to light.  Cardiovascular:     Rate and Rhythm: Normal rate and regular rhythm.     Heart sounds: Normal heart sounds.  Pulmonary:     Effort: Pulmonary effort is normal. No respiratory distress.     Breath sounds: Normal breath sounds.  Abdominal:     General: There is no distension.     Palpations: Abdomen is soft.     Tenderness: There is no abdominal tenderness.  Musculoskeletal:        General: No deformity. Normal range of motion.     Cervical back: Normal range of motion and neck supple.  Skin:    General: Skin is warm and dry.  Neurological:     General: No focal deficit present.     Mental Status: She is alert and oriented to person, place, and time.     Cranial Nerves: No cranial nerve deficit.     Motor: No weakness.     Comments: Normal speech, no facial droop, LVO screen negative  Reported "tingling" paresthesias to the left upper extremity circumferentially at the elbow and then distal.  5 out of 5 strength in left upper and right upper extremity.  Normal gait.    ED Results / Procedures / Treatments   Labs (all labs ordered are listed, but only abnormal results are displayed) Labs Reviewed - No data to display  EKG EKG Interpretation  Date/Time:  Thursday July 23 2021 00:02:58 EDT Ventricular Rate:  72 PR Interval:  122 QRS Duration: 82 QT  Interval:  408 QTC Calculation: 446 R Axis:   71 Text Interpretation: Normal sinus rhythm with sinus arrhythmia Normal ECG Confirmed by Kristine Royal [phone]) on 07/23/2021 8:32:52 AM  Radiology MR BRAIN WO CONTRAST  Result Date: 07/23/2021 CLINICAL DATA:  Numbness or tingling, paresthesia. Left arm numbness EXAM: MRI HEAD WITHOUT CONTRAST MRI CERVICAL SPINE WITHOUT CONTRAST TECHNIQUE: Multiplanar, multiecho pulse sequences of the brain and surrounding structures, and cervical spine, to include the craniocervical junction and cervicothoracic junction, were obtained without intravenous contrast. COMPARISON:  None. FINDINGS: MRI HEAD FINDINGS Brain: No acute infarction, hemorrhage, hydrocephalus, extra-axial collection or mass lesion. Vascular: Normal flow voids. Skull and upper cervical spine: Normal marrow signal. Sinuses/Orbits: Negative. MRI CERVICAL SPINE FINDINGS Alignment: Reversal of cervical lordosis. Vertebrae: Mild discogenic endplate edema at G2-5. No evidence of bone lesion or discitis. Cord: Normal signal and morphology Posterior Fossa, vertebral arteries, paraspinal tissues: Negative Disc levels: C4-5 moderate left-sided facet spurring with borderline anterolisthesis. No impingement. C5-6 disc narrowing and endplate degeneration with foraminal predominant bulging and uncovertebral spurring greater on the left where there is foraminal impingement. Patent canal and right foramen IMPRESSION: Brain MRI: Negative.  No explanation for weakness. Cervical MRI: 1. Focal disc degeneration at C5-6 with left foraminal impingement. 2. Normal appearance of the cord. Electronically Signed   By: Marnee Spring M.D.   On: 07/23/2021 04:34   MR Cervical Spine Wo Contrast  Result Date: 07/23/2021 CLINICAL DATA:  Numbness or tingling, paresthesia. Left arm numbness EXAM: MRI HEAD WITHOUT CONTRAST MRI CERVICAL SPINE WITHOUT CONTRAST TECHNIQUE: Multiplanar, multiecho pulse sequences of the brain and surrounding  structures, and cervical spine, to include the craniocervical junction and cervicothoracic junction, were obtained without intravenous contrast. COMPARISON:  None. FINDINGS: MRI HEAD FINDINGS Brain: No acute infarction, hemorrhage,  hydrocephalus, extra-axial collection or mass lesion. Vascular: Normal flow voids. Skull and upper cervical spine: Normal marrow signal. Sinuses/Orbits: Negative. MRI CERVICAL SPINE FINDINGS Alignment: Reversal of cervical lordosis. Vertebrae: Mild discogenic endplate edema at W1-1C5-6. No evidence of bone lesion or discitis. Cord: Normal signal and morphology Posterior Fossa, vertebral arteries, paraspinal tissues: Negative Disc levels: C4-5 moderate left-sided facet spurring with borderline anterolisthesis. No impingement. C5-6 disc narrowing and endplate degeneration with foraminal predominant bulging and uncovertebral spurring greater on the left where there is foraminal impingement. Patent canal and right foramen IMPRESSION: Brain MRI: Negative.  No explanation for weakness. Cervical MRI: 1. Focal disc degeneration at C5-6 with left foraminal impingement. 2. Normal appearance of the cord. Electronically Signed   By: Marnee SpringJonathon  Watts M.D.   On: 07/23/2021 04:34    Procedures Procedures   Medications Ordered in ED Medications - No data to display  ED Course  I have reviewed the triage vital signs and the nursing notes.  Pertinent labs & imaging results that were available during my care of the patient were reviewed by me and considered in my medical decision making (see chart for details).    MDM Rules/Calculators/A&P                           MDM  MSE complete  Betty Kleinshley Brooke Cecena was evaluated in Emergency Department on 07/23/2021 for the symptoms described in the history of present illness. She was evaluated in the context of the global COVID-19 pandemic, which necessitated consideration that the patient might be at risk for infection with the SARS-CoV-2 virus that  causes COVID-19. Institutional protocols and algorithms that pertain to the evaluation of patients at risk for COVID-19 are in a state of rapid change based on information released by regulatory bodies including the CDC and federal and state organizations. These policies and algorithms were followed during the patient's care in the ED.   Patient is presenting for MRI imaging.  Patient complains of left upper extremity tingling paresthesia.  This began after use of illicit substance and then brief period of being obtunded with her left arm folded underneath her.  Initial labs and CT from outside facility are without significant abnormality.  MRIs obtained today are without evidence of acute stroke or hemorrhage.  There is foraminal impingement at 5-6 level on the left secondary to disc degeneration.  However, patient's presentation and provided history are most consistent with likely transient neuropraxia related to positioning of her left arm underneath her body after illicit drug use.  Patient is improved at this time.  She does understand need for close follow-up.  She declines further evaluation or work-up.  She is advised to avoid use of illicit substances since this could result in significant illness and/or death.  Importance of close follow-up is stressed.  Strict return precautions given and understood.  Final Clinical Impression(s) / ED Diagnoses Final diagnoses:  Left arm numbness    Rx / DC Orders ED Discharge Orders     None        Wynetta FinesMessick, Ronnae Kaser C, MD 07/23/21 [phone]

## 2021-07-23 NOTE — ED Notes (Signed)
Notified MRI at [facility] in regards to patient's pending arrival.

## 2021-07-23 NOTE — ED Notes (Signed)
From [facility], pt reports no change since leaving their facility.  Here for an MRI.  No c/o at this time.

## 2021-07-29 ENCOUNTER — Other Ambulatory Visit: Payer: Self-pay

## 2021-09-05 DEATH — deceased
